# Patient Record
Sex: Male | Born: 1960 | Race: White | Hispanic: No | Marital: Married | Smoking: Never smoker
Health system: Southern US, Community
[De-identification: ages and names within clinical notes are randomized; demographics above are authoritative.]

## PROBLEM LIST (undated history)

## (undated) DIAGNOSIS — I2510 Atherosclerotic heart disease of native coronary artery without angina pectoris: Secondary | ICD-10-CM

## (undated) DIAGNOSIS — M722 Plantar fascial fibromatosis: Secondary | ICD-10-CM

## (undated) DIAGNOSIS — R739 Hyperglycemia, unspecified: Secondary | ICD-10-CM

## (undated) DIAGNOSIS — L409 Psoriasis, unspecified: Secondary | ICD-10-CM

## (undated) DIAGNOSIS — I255 Ischemic cardiomyopathy: Secondary | ICD-10-CM

## (undated) DIAGNOSIS — E669 Obesity, unspecified: Secondary | ICD-10-CM

## (undated) DIAGNOSIS — N400 Enlarged prostate without lower urinary tract symptoms: Secondary | ICD-10-CM

## (undated) DIAGNOSIS — E039 Hypothyroidism, unspecified: Secondary | ICD-10-CM

## (undated) DIAGNOSIS — N281 Cyst of kidney, acquired: Secondary | ICD-10-CM

## (undated) DIAGNOSIS — I4510 Unspecified right bundle-branch block: Secondary | ICD-10-CM

## (undated) DIAGNOSIS — E785 Hyperlipidemia, unspecified: Secondary | ICD-10-CM

## (undated) HISTORY — DX: Benign prostatic hyperplasia without lower urinary tract symptoms: N40.0

## (undated) HISTORY — DX: Hypothyroidism, unspecified: E03.9

## (undated) HISTORY — DX: Cyst of kidney, acquired: N28.1

## (undated) HISTORY — PX: COLONOSCOPY: SHX174

## (undated) HISTORY — DX: Psoriasis, unspecified: L40.9

## (undated) HISTORY — DX: Obesity, unspecified: E66.9

## (undated) HISTORY — DX: Plantar fascial fibromatosis: M72.2

---

## 2009-07-28 ENCOUNTER — Encounter: Payer: Self-pay | Admitting: Cardiovascular Disease

## 2009-07-28 ENCOUNTER — Ambulatory Visit: Payer: Self-pay

## 2009-07-28 DIAGNOSIS — M79609 Pain in unspecified limb: Secondary | ICD-10-CM | POA: Insufficient documentation

## 2010-07-27 NOTE — Miscellaneous (Signed)
Summary: Orders Update  Clinical Lists Changes  Problems: Added new problem of LEG PAIN (ICD-729.5) Orders: Added new Test order of Venous Duplex Lower Extremity (Venous Duplex Lower) - Signed 

## 2014-01-08 ENCOUNTER — Telehealth: Payer: Self-pay

## 2014-01-08 NOTE — Telephone Encounter (Signed)
Pt walked in with c/o lower left abdominal pain x 2 days. Pt notes that his diet has been off due to visiting family and eating a lot more fast foods. He notices that his BMs have been irregular, stating that they have been hard and he feels it may be constipation or a hernia. He notes that when he lies down the pain is better but still present. Moving makes it worse and pain is constant. Pt denies other sxs.  [facility]. Pt should try magnesium citrate tonight to tx possible constipation. If pt does not have a BM tonight and/or pain is still present in the morning, pt should call office to schedule appointment to see Dr. Durene CalHunter. Also advised pt if pain becomes extreme or he develops other sxs such as fever or chills, he should report to the nearest ER.  Pt verbalized understanding

## 2014-01-09 ENCOUNTER — Emergency Department (HOSPITAL_COMMUNITY)
Admission: EM | Admit: 2014-01-09 | Discharge: 2014-01-09 | Disposition: A | Discharge status: Home / Self Care | Payer: 59 | Attending: Emergency Medicine | Admitting: Emergency Medicine

## 2014-01-09 ENCOUNTER — Encounter (HOSPITAL_COMMUNITY): Payer: Self-pay | Admitting: Emergency Medicine

## 2014-01-09 ENCOUNTER — Emergency Department (HOSPITAL_COMMUNITY): Payer: 59

## 2014-01-09 DIAGNOSIS — R1032 Left lower quadrant pain: Secondary | ICD-10-CM | POA: Insufficient documentation

## 2014-01-09 DIAGNOSIS — Z79899 Other long term (current) drug therapy: Secondary | ICD-10-CM | POA: Insufficient documentation

## 2014-01-09 LAB — BASIC METABOLIC PANEL
Anion gap: 14 (ref 5–15)
BUN: 20 mg/dL (ref 6–23)
CO2: 24 mEq/L (ref 19–32)
Calcium: 9.3 mg/dL (ref 8.4–10.5)
Chloride: 104 mEq/L (ref 96–112)
Creatinine, Ser: 0.91 mg/dL (ref 0.50–1.35)
GFR calc Af Amer: >90 mL/min (ref >90)
GFR calc non Af Amer: >90 mL/min (ref >90)
Glucose, Bld: 105 mg/dL — ABNORMAL HIGH (ref 70–99)
Potassium: 4.2 mEq/L (ref 3.7–5.3)
Sodium: 142 mEq/L (ref 137–147)

## 2014-01-09 LAB — CBC WITH DIFFERENTIAL/PLATELET
Basophils Absolute: 0 10*3/uL (ref 0.0–0.1)
Basophils Relative: 0 % (ref 0–1)
Eosinophils Absolute: 0.1 10*3/uL (ref 0.0–0.7)
Eosinophils Relative: 1 % (ref 0–5)
HCT: 43.5 % (ref 39.0–52.0)
Hemoglobin: 14.3 g/dL (ref 13.0–17.0)
Lymphocytes Relative: 27 % (ref 12–46)
Lymphs Abs: 2.6 10*3/uL (ref 0.7–4.0)
MCH: 27.9 pg (ref 26.0–34.0)
MCHC: 32.9 g/dL (ref 30.0–36.0)
MCV: 85 fL (ref 78.0–100.0)
Monocytes Absolute: 1 10*3/uL (ref 0.1–1.0)
Monocytes Relative: 10 % (ref 3–12)
Neutro Abs: 5.8 10*3/uL (ref 1.7–7.7)
Neutrophils Relative %: 62 % (ref 43–77)
Platelets: 330 10*3/uL (ref 150–400)
RBC: 5.12 MIL/uL (ref 4.22–5.81)
RDW: 13.4 % (ref 11.5–15.5)
WBC: 9.5 10*3/uL (ref 4.0–10.5)

## 2014-01-09 LAB — I-STAT CG4 LACTIC ACID, ED: Lactic Acid, Venous: 1.23 mmol/L (ref 0.5–2.2)

## 2014-01-09 MED ORDER — FENTANYL CITRATE 0.05 MG/ML IJ SOLN
100.0000 ug | Freq: Once | INTRAMUSCULAR | Status: AC
  Administered 2014-01-09: 100 ug via INTRAVENOUS
  Filled 2014-01-09: qty 2

## 2014-01-09 MED ORDER — IOHEXOL 300 MG/ML  SOLN
100.0000 mL | Freq: Once | INTRAMUSCULAR | Status: AC | PRN
  Administered 2014-01-09: 100 mL via INTRAVENOUS

## 2014-01-09 MED ORDER — DOCUSATE SODIUM 100 MG PO CAPS: 100.0000 mg | ORAL_CAPSULE | Freq: Two times a day (BID) | ORAL | Status: DC

## 2014-01-09 MED ORDER — IOHEXOL 300 MG/ML  SOLN
50.0000 mL | Freq: Once | INTRAMUSCULAR | Status: AC | PRN
  Administered 2014-01-09: 50 mL via ORAL

## 2014-01-09 MED ORDER — TRAMADOL HCL 50 MG PO TABS: 50.0000 mg | ORAL_TABLET | Freq: Four times a day (QID) | ORAL | Status: DC | PRN

## 2014-01-09 NOTE — ED Provider Notes (Signed)
CSN: [id number]     Arrival date & time 01/09/14  0505 History   First MD Initiated Contact with Patient 01/09/14 [phone]     Chief Complaint  Patient presents with  . Abdominal Pain     (Consider location/radiation/quality/duration/timing/severity/associated sxs/prior Treatment) HPI Comments: 53 year old male who presents with a complaint of left lower quadrant and abdominal pain. He states this started 2 days ago, he feels as though this is related to constipation and no change in his diet, he has recently been at a family reunion where he has been "doubling up"on the meals.  He has had persistent gradually worsening pain in the LLQ, no BM in 3 days - went to the family doctor yesterday that told him to take mag citrate and he had a small am't of diarrhea but no appreciable stool.  He has no n/v and no f/c.  No prior abd surgery and no hx of diverticulitis - recently moved her - no other PMH.  Denies dysuria, hematuria, testicular pain.  Patient is a 53 y.o. male presenting with abdominal pain. The history is provided by the patient.  Abdominal Pain   History reviewed. No pertinent past medical history. History reviewed. No pertinent past surgical history. History reviewed. No pertinent family history. History  Substance Use Topics  . Smoking status: Never Smoker   . Smokeless tobacco: Not on file  . Alcohol Use: No    Review of Systems  Gastrointestinal: Positive for abdominal pain.  All other systems reviewed and are negative.     Allergies  Review of patient's allergies indicates no known allergies.  Home Medications   Prior to Admission medications   Medication Sig Start Date End Date Taking? Authorizing Provider  ibuprofen (ADVIL,MOTRIN) 200 MG tablet Take 800 mg by mouth every 6 (six) hours as needed (pain).   Yes Historical Provider, MD  magnesium citrate SOLN Take 1 Bottle by mouth once.   Yes Historical Provider, MD  docusate sodium (COLACE) 100 MG capsule Take 1  capsule (100 mg total) by mouth every 12 (twelve) hours. 01/09/14   Vida Roller, MD  traMADol (ULTRAM) 50 MG tablet Take 1 tablet (50 mg total) by mouth every 6 (six) hours as needed. 01/09/14   Vida Roller, MD   BP 135/81  Pulse 92  Temp(Src) 98.1 F (36.7 C) (Oral)  Resp 20  Ht 5\' 11"  (1.803 m)  Wt 225 lb (102.059 kg)  BMI 31.39 kg/m2  SpO2 98% Physical Exam  Nursing note and vitals reviewed. Constitutional: He appears well-developed and well-nourished. No distress.  HENT:  Head: Normocephalic and atraumatic.  Mouth/Throat: Oropharynx is clear and moist. No oropharyngeal exudate.  Eyes: Conjunctivae and EOM are normal. Pupils are equal, round, and reactive to light. Right eye exhibits no discharge. Left eye exhibits no discharge. No scleral icterus.  Neck: Normal range of motion. Neck supple. No JVD present. No thyromegaly present.  Cardiovascular: Normal rate, regular rhythm, normal heart sounds and intact distal pulses.  Exam reveals no gallop and no friction rub.   No murmur heard. Pulmonary/Chest: Effort normal and breath sounds normal. No respiratory distress. He has no wheezes. He has no rales.  Abdominal: Soft. Bowel sounds are normal. He exhibits no distension and no mass. There is tenderness ( ttp in the LLQ is mild - no rebound or peritoneal signs).  Musculoskeletal: Normal range of motion. He exhibits no edema and no tenderness.  Lymphadenopathy:    He has no cervical adenopathy.  Neurological:  He is alert. Coordination normal.  Skin: Skin is warm and dry. No rash noted. No erythema.  Psychiatric: He has a normal mood and affect. His behavior is normal.    ED Course  Procedures (including critical care time) Labs Review Labs Reviewed  BASIC METABOLIC PANEL - Abnormal; Notable for the following:    Glucose, Bld 105 (*)    All other components within normal limits  CBC WITH DIFFERENTIAL  I-STAT CG4 LACTIC ACID, ED    Imaging Review Ct Abdomen Pelvis W  Contrast  01/09/2014   CLINICAL DATA:  Left lower quadrant pain.  EXAM: CT ABDOMEN AND PELVIS WITH CONTRAST  TECHNIQUE: Multidetector CT imaging of the abdomen and pelvis was performed using the standard protocol following bolus administration of intravenous contrast.  CONTRAST:  100mL OMNIPAQUE IOHEXOL 300 MG/ML  SOLN  COMPARISON:  None.  FINDINGS: The liver, biliary tree, spleen, pancreas and adrenal glands are normal. There is a 2 mm stone in the upper pole of the right kidney and a 17 mm cyst on the posterior aspect of mid left kidney.  The bowel appears normal including the terminal ileum and appendix. No diverticular disease. No osseous abnormality. No free air or free fluid.  IMPRESSION: No acute abnormality. Tiny stone in the upper pole of the right kidney. Small cyst on the left kidney.   Electronically Signed   By: Geanie CooleyJim  Maxwell M.D.   On: 01/09/2014 07:17      MDM   Final diagnoses:  Left lower quadrant pain    Possible diverticulitis - constipation, hernia, less likely AAA or appy.  Labs unremarkable, CT scan negative for acute findings  Patient informed of results, medications as below   Meds given in ED:  Medications  fentaNYL (SUBLIMAZE) injection 100 mcg (100 mcg Intravenous Given 01/09/14 0550)  iohexol (OMNIPAQUE) 300 MG/ML solution 50 mL (50 mLs Oral Contrast Given 01/09/14 0557)  iohexol (OMNIPAQUE) 300 MG/ML solution 100 mL (100 mLs Intravenous Contrast Given 01/09/14 0648)    New Prescriptions   DOCUSATE SODIUM (COLACE) 100 MG CAPSULE    Take 1 capsule (100 mg total) by mouth every 12 (twelve) hours.   TRAMADOL (ULTRAM) 50 MG TABLET    Take 1 tablet (50 mg total) by mouth every 6 (six) hours as needed.      Vida RollerBrian D Morgen Linebaugh, MD 01/09/14 [id number]

## 2014-01-09 NOTE — ED Notes (Signed)
Pt is c/o abd pain in his left lower quadrant  Pt states the pain started on Tuesday  Pt was seen by his GI dr yesterday and was told to take a laxative so he took one last night  Pt states he has been very uncomfortable all night and has not been able to sleep

## 2014-01-22 ENCOUNTER — Encounter: Payer: Self-pay | Admitting: Family Medicine

## 2014-01-22 ENCOUNTER — Ambulatory Visit: Payer: 59

## 2014-01-22 ENCOUNTER — Ambulatory Visit (INDEPENDENT_AMBULATORY_CARE_PROVIDER_SITE_OTHER): Payer: 59 | Admitting: Family Medicine

## 2014-01-22 VITALS — BP 140/98 | HR 56 | Temp 98.4°F | Wt 232.0 lb

## 2014-01-22 DIAGNOSIS — N281 Cyst of kidney, acquired: Secondary | ICD-10-CM

## 2014-01-22 DIAGNOSIS — G43909 Migraine, unspecified, not intractable, without status migrainosus: Secondary | ICD-10-CM | POA: Insufficient documentation

## 2014-01-22 DIAGNOSIS — L409 Psoriasis, unspecified: Secondary | ICD-10-CM

## 2014-01-22 DIAGNOSIS — E039 Hypothyroidism, unspecified: Secondary | ICD-10-CM

## 2014-01-22 DIAGNOSIS — N400 Enlarged prostate without lower urinary tract symptoms: Secondary | ICD-10-CM | POA: Insufficient documentation

## 2014-01-22 DIAGNOSIS — IMO0001 Reserved for inherently not codable concepts without codable children: Secondary | ICD-10-CM

## 2014-01-22 DIAGNOSIS — G43809 Other migraine, not intractable, without status migrainosus: Secondary | ICD-10-CM

## 2014-01-22 DIAGNOSIS — R030 Elevated blood-pressure reading, without diagnosis of hypertension: Secondary | ICD-10-CM

## 2014-01-22 DIAGNOSIS — Q619 Cystic kidney disease, unspecified: Secondary | ICD-10-CM

## 2014-01-22 DIAGNOSIS — M722 Plantar fascial fibromatosis: Secondary | ICD-10-CM

## 2014-01-22 DIAGNOSIS — E669 Obesity, unspecified: Secondary | ICD-10-CM

## 2014-01-22 DIAGNOSIS — G43C0 Periodic headache syndromes in child or adult, not intractable: Secondary | ICD-10-CM

## 2014-01-22 DIAGNOSIS — L408 Other psoriasis: Secondary | ICD-10-CM

## 2014-01-22 HISTORY — DX: Cyst of kidney, acquired: N28.1

## 2014-01-22 LAB — HEPATIC FUNCTION PANEL
ALT: 47 U/L (ref 0–53)
AST: 31 U/L (ref 0–37)
Albumin: 4.2 g/dL (ref 3.5–5.2)
Alkaline Phosphatase: 43 U/L (ref 39–117)
Bilirubin, Direct: 0.1 mg/dL (ref 0.0–0.3)
Total Bilirubin: 0.9 mg/dL (ref 0.2–1.2)
Total Protein: 7.1 g/dL (ref 6.0–8.3)

## 2014-01-22 LAB — LIPID PANEL
Cholesterol: 228 mg/dL — ABNORMAL HIGH (ref 0–200)
HDL: 49.8 mg/dL (ref >39.00)
LDL Cholesterol: 152 mg/dL — ABNORMAL HIGH (ref 0–99)
NonHDL: 178.2
Total CHOL/HDL Ratio: 5
Triglycerides: 133 mg/dL (ref 0.0–149.0)
VLDL: 26.6 mg/dL (ref 0.0–40.0)

## 2014-01-22 LAB — TSH: TSH: 7.56 u[IU]/mL — ABNORMAL HIGH (ref 0.35–4.50)

## 2014-01-22 LAB — HEMOGLOBIN A1C: Hgb A1c MFr Bld: 6.1 % (ref 4.6–6.5)

## 2014-01-22 LAB — PSA: PSA: 0.64 ng/mL (ref 0.10–4.00)

## 2014-01-22 MED ORDER — TRIAMCINOLONE ACETONIDE 0.1 % EX CREA: 1.0000 "application " | TOPICAL_CREAM | Freq: Two times a day (BID) | CUTANEOUS | Status: DC

## 2014-01-22 NOTE — Progress Notes (Signed)
Tana Conch, MD Phone: [phone]  Subjective:  Patient presents today to establish care. Chief complaint-noted.   Abdominal Pain Felt like pulled muscle in LLQ. Low and deep pain. Thought also could be indigestion as was eating very large meals. Slowly worsened over several days. Felt enough pain and walked into office and talked to nursing team. Recommended OTC mag citrate. Very small bowel movement with this. Pain intensified that evening and went to ED. Had CT scan which was largely unremarkable except for kidney stone in upper pole which is not likely cause. 7/10 pain down to 4/10 with fentanyl. Usually BM every day or every other day. At least a few days between bowel movements at time of elevation of pain. Has taken colace and pain has gone down to 0/10. Drank a lot of water as concerned about kidney stone as cause of pain. Next morning had very low level of pain. Did not have large bowel movement that evening ROS-no fever/chills. No vomiting. Mild nausea.   Elevated Blood Pressure  BP Readings from Last 3 Encounters:  01/22/14 140/98  01/09/14 136/86  Home BP monitoring-no Compliant with medications-no medications ROS-Denies any CP, HA, SOB, blurry vision, LE edema.  Hypothyroidism On thyroid medication-no, reports many years ago was on low dose thyroid medication for fatigue.  ROS-No hair or nail changes. No heat/cold intolerance. No constipation or diarrhea. Denies shakiness or anxiety.   Psoriasis Long term problem. 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 Has 2 small spots on his back he is not sure is psoriasis ROS- noted recently, unsure if changing size. Red and scaly. Similar to lesion on elbow.   The following were reviewed and entered/updated in epic: Past Medical History  Diagnosis Date  . Psoriasis   . Plantar fasciitis of left foot   . Hypothyroidism     patient questionable about diagnosis  . BPH (benign prostatic hyperplasia)     describes treatment  [place] country, not currently as bothresome  . Renal cyst 01/22/2014    17mm left kidney. Bozniak Class I in discussion with [facility]. No further follow up.     Patient Active Problem List   Diagnosis Date Noted  . Renal cyst 01/22/2014    Priority: Low  . Elevated blood pressure 01/22/2014    Priority: Low  . Obesity 01/22/2014    Priority: Low  . Migraine 01/22/2014    Priority: Low  . Psoriasis     Priority: Low  . Plantar fasciitis of left foot     Priority: Low  . Hypothyroidism     Priority: Low  . BPH (benign prostatic hyperplasia)     Priority: Low   Past Surgical History  Procedure Laterality Date  . Colonoscopy      30s. concern for diverticulitis.     Family History  Problem Relation Age of Onset  . Hypothyroidism Mother   . Lung cancer      father and grand father but both smokers. Both deceased due to this.   . Diabetes Father   . Hypothyroidism Sister   . Bipolar disorder Brother     Medications- reviewed and updated Current Outpatient Prescriptions  Medication Sig Dispense Refill  . aspirin 81 MG tablet Take 81 mg by mouth daily.      Marland Kitchen ibuprofen (ADVIL,MOTRIN) 200 MG tablet Take 800 mg by mouth every 6 (six) hours as needed (pain).      . triamcinolone cream (KENALOG) 0.1 % Apply 1  application topically 2 (two) times daily. For 2 weeks then 1 week break to spots on back  85.2 g  0   No current facility-administered medications for this visit.   Allergies-reviewed and updated No Known Allergies  History   Social History  . Marital Status: Married    Spouse Name: N/A    Number of Children: N/A  . Years of Education: N/A   Social History Main Topics  . Smoking status: Never Smoker   . Smokeless tobacco: None  . Alcohol Use: 1.5 oz/week    3 drink(s) per week  . Drug Use: No  . Sexual Activity: None   Other Topics Concern  . None   Social History Narrative   Married for 26 years in 2015. 2 kids- 20  daughter-[employer] and 17 son.    Hobbies- yardwork, family time, travel-[place]; sports-pro football and college basketball, music.    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 26 years-lots of travel previously lived in [place], [place] (12 years). Just relocated to [facility] 12/2013.    ROS--See HPI, otherwise full 12 point review of systems completed and negative  Objective: BP 140/98  Pulse 56  Temp(Src) 98.4 F (36.9 C)  Wt 232 lb (105.235 kg) Gen: NAD, resting comfortably HEENT: Mucous membranes are moist. Oropharynx normal Neck: no thyromegaly CV: RRR no murmurs rubs or gallops Lungs: CTAB no crackles, wheeze, rhonchi Abdomen: soft/nontender/nondistended/normal bowel sounds. No rebound or guarding.  Ext: no edema, 2+ peripheral pulses Skin: warm, dry, scaly rash on left elbow. 2 small <2 cm scaly plaques  Neuro: grossly normal, moves all extremities, PERRLA  Assessment/Plan:  Hypothyroidism Unclear if truly has history of hypothyroidism and asymptomatic at this time. Give on thyroid medication before, check TSH before physical.   Psoriasis Currently untreated. 2 small lesions on back <2 cm. Will attempt triamcinolone to give further information.   Elevated blood pressure First elevated reading today. We discussed a dash diet and follow up at physical.   Obesity Advised of need for weight loss with goal BMI <30. Referred to nutrition. Check a1c given brother with DM and obesity.   Renal cyst 17mm left kidney. Bozniak Class I in [facility] with [facility]. No further follow up. Will resolve problem.    Abdominal Pain Unclear etiology. May have been constipation or kidney stone. Now resolved. Benign abdominal exam today.   Counseled patient on grade D recommendation for prostate cancer as well as benefits/risks of screening. He would like to still be screened.   Orders Placed This Encounter  Procedures  .  Hepatic Function Panel    Standing Status: Future     Number of Occurrences: 1     Standing Expiration Date: 01/23/2015  . Hemoglobin A1c    [place]    Standing Status: Future     Number of Occurrences: 1     Standing Expiration Date: 01/23/2015  . TSH    [place]    Standing Status: Future     Number of Occurrences: 1     Standing Expiration Date: 01/23/2015  . PSA    Standing Status: Future     Number of Occurrences: 1     Standing Expiration Date: 01/23/2015  . Lipid panel    [place]    Standing Status: Future     Number of Occurrences: 1     Standing Expiration Date: 01/22/2015    Order Specific Question:  Has the patient fasted?  Answer:  Yes  . Ambulatory referral to Nutrition and Diabetic Education    Referral Priority:  Routine    Referral Type:  Consultation    Referral Reason:  Specialty Services Required    Number of Visits Requested:  1    Meds ordered this encounter  Medications  . triamcinolone cream (KENALOG) 0.1 %    Sig: Apply 1 application topically 2 (two) times daily. For 2 weeks then 1 week break to spots on back    Dispense:  85.2 g    Refill:  0  . aspirin 81 MG tablet    Sig: Take 81 mg by mouth daily.

## 2014-01-22 NOTE — Assessment & Plan Note (Signed)
Advised of need for weight loss with goal BMI <30. Referred to nutrition. Check a1c given brother with DM and obesity.

## 2014-01-22 NOTE — Assessment & Plan Note (Signed)
17mm left kidney. [facility] No further follow up. Will resolve problem.

## 2014-01-22 NOTE — Patient Instructions (Addendum)
Abdominal Pain-unclear etiology. May have been constipation or kidney stone.   I am thrilled to hear you want to make some changes to live a healthier lifestyle. I have referred you to nutrition as well as made some suggestions below.   I want you to try the steroid cream on your back to see if this helps (would make psoriasis even more likely).   Over age 53, I would recommend a daily 81mg  aspirin as you are a male.   We will get the following labs. Set up a physical and then a lab appointment about a week before. We will discuss your labs at that visit.   Orders Placed This Encounter  Procedures  . Hepatic Function Panel    Standing Status: Future     Number of Occurrences:      Standing Expiration Date: 01/23/2015  . Hemoglobin A1c    [place]    Standing Status: Future     Number of Occurrences:      Standing Expiration Date: 01/23/2015  . TSH    [place]    Standing Status: Future     Number of Occurrences:      Standing Expiration Date: 01/23/2015  . Ambulatory referral to Nutrition and Diabetic Education    Referral Priority:  Routine    Referral Type:  Consultation    Referral Reason:  Specialty Services Required    Number of Visits Requested:  1   Health Maintenance Due  Topic Date Due  . Tetanus/tdap  12/13/1981  . Colonoscopy  12/13/2012            My 5 to Fitness!  5: fruits and vegetables per day (work on 9 per day if you are at 5) 4: exercise 4-5 times per week for at least 30 minutes (walking counts!). 150 minutes per week 3: meals per day (don't skip breakfast!) 2: habits to quit  -smoking  -excess alcohol use (men >2 beer/day; women >1beer/day) 1: sweet per day (2 cookies, 1 small cup of ice cream, 12 oz soda)  These are general tips for healthy living. Try to start with 1 or 2 habit TODAY and make it a part of your life for several months.  Once you have 1 or 2 habits down for several months, try to begin working on your next healthy habit. With  every single step you take, you will be leading a healthier lifestyle!  DASH Eating Plan DASH stands for "Dietary Approaches to Stop Hypertension." The DASH eating plan is a healthy eating plan that has been shown to reduce high blood pressure (hypertension). Additional health benefits may include reducing the risk of type 2 diabetes mellitus, heart disease, and stroke. The DASH eating plan may also help with weight loss. WHAT DO I NEED TO KNOW ABOUT THE DASH EATING PLAN? For the DASH eating plan, you will follow these general guidelines:  Choose foods with a percent daily value for sodium of less than 5% (as listed on the food label).  Use salt-free seasonings or herbs instead of table salt or sea salt.  Check with your health care provider or pharmacist before using salt substitutes.  Eat lower-sodium products, often labeled as "lower sodium" or "no salt added."  Eat fresh foods.  Eat more vegetables, fruits, and low-fat dairy products.  Choose whole grains. Look for the word "whole" as the first word in the ingredient list.  Choose fish and skinless chicken or [place] more often than red meat. Limit fish, poultry, and  meat to 6 oz (170 g) each day.  Limit sweets, desserts, sugars, and sugary drinks.  Choose heart-healthy fats.  Limit cheese to 1 oz (28 g) per day.  Eat more home-cooked food and less restaurant, buffet, and fast food.  Limit fried foods.  Cook foods using methods other than frying.  Limit canned vegetables. If you do use them, rinse them well to decrease the sodium.  When eating at a restaurant, ask that your food be prepared with less salt, or no salt if possible. WHAT FOODS CAN I EAT? Seek help from a dietitian for individual calorie needs. Grains Whole grain or whole wheat bread. Brown rice. Whole grain or whole wheat pasta. Quinoa, bulgur, and whole grain cereals. Low-sodium cereals. Corn or whole wheat flour tortillas. Whole grain cornbread. Whole grain  crackers. Low-sodium crackers. Vegetables Fresh or frozen vegetables (raw, steamed, roasted, or grilled). Low-sodium or reduced-sodium tomato and vegetable juices. Low-sodium or reduced-sodium tomato sauce and paste. Low-sodium or reduced-sodium canned vegetables.  Fruits All fresh, canned (in natural juice), or frozen fruits. Meat and Other Protein Products Ground beef (85% or leaner), grass-fed beef, or beef trimmed of fat. Skinless chicken or [place]. Ground chicken or [place]. Pork trimmed of fat. All fish and seafood. Eggs. Dried beans, peas, or lentils. Unsalted nuts and seeds. Unsalted canned beans. Dairy Low-fat dairy products, such as skim or 1% milk, 2% or reduced-fat cheeses, low-fat ricotta or cottage cheese, or plain low-fat yogurt. Low-sodium or reduced-sodium cheeses. Fats and Oils Tub margarines without trans fats. Light or reduced-fat mayonnaise and salad dressings (reduced sodium). Avocado. Safflower, olive, or canola oils. Natural peanut or almond butter. Other Unsalted popcorn and pretzels. The items listed above may not be a complete list of recommended foods or beverages. Contact your dietitian for more options. WHAT FOODS ARE NOT RECOMMENDED? Grains White bread. White pasta. White rice. Refined cornbread. Bagels and croissants. Crackers that contain trans fat. Vegetables Creamed or fried vegetables. Vegetables in a cheese sauce. Regular canned vegetables. Regular canned tomato sauce and paste. Regular tomato and vegetable juices. Fruits Dried fruits. Canned fruit in light or heavy syrup. Fruit juice. Meat and Other Protein Products Fatty cuts of meat. Ribs, chicken wings, bacon, sausage, bologna, salami, chitterlings, fatback, hot dogs, bratwurst, and packaged luncheon meats. Salted nuts and seeds. Canned beans with salt. Dairy Whole or 2% milk, cream, half-and-half, and cream cheese. Whole-fat or sweetened yogurt. Full-fat cheeses or blue cheese. Nondairy creamers and  whipped toppings. Processed cheese, cheese spreads, or cheese curds. Condiments Onion and garlic salt, seasoned salt, table salt, and sea salt. Canned and packaged gravies. Worcestershire sauce. Tartar sauce. Barbecue sauce. Teriyaki sauce. Soy sauce, including reduced sodium. Steak sauce. Fish sauce. Oyster sauce. Cocktail sauce. Horseradish. Ketchup and mustard. Meat flavorings and tenderizers. Bouillon cubes. Hot sauce. Tabasco sauce. Marinades. Taco seasonings. Relishes. Fats and Oils Butter, stick margarine, lard, shortening, ghee, and bacon fat. Coconut, palm kernel, or palm oils. Regular salad dressings. Other Pickles and olives. Salted popcorn and pretzels. The items listed above may not be a complete list of foods and beverages to avoid. Contact your dietitian for more information. WHERE CAN I FIND MORE INFORMATION? National Heart, Lung, and Blood Institute: CableProm[employer].itwww.nhlbi.nih.gov/health/health-topics/topics/dash/ Document Released: 06/02/2011 Document Revised: 10/28/2013 Document Reviewed: 04/17/2013 [facility] Patient Information 2015 [employer] [place]. This information is not intended to replace advice given to you by your health care provider. Make sure you discuss any questions you have with your health care provider.

## 2014-01-22 NOTE — Assessment & Plan Note (Signed)
First elevated reading today. We discussed a dash diet and follow up at physical.

## 2014-01-22 NOTE — Assessment & Plan Note (Signed)
Unclear if truly has history of hypothyroidism and asymptomatic at this time. Give on thyroid medication before, check TSH before physical.

## 2014-01-22 NOTE — Assessment & Plan Note (Signed)
Currently untreated. 2 small lesions on back <2 cm. Will attempt triamcinolone to give further information.

## 2014-01-23 LAB — T4, FREE: Free T4: 0.69 ng/dL (ref 0.60–1.60)

## 2014-03-14 ENCOUNTER — Ambulatory Visit: Payer: 59 | Admitting: Dietician

## 2014-03-14 ENCOUNTER — Encounter: Payer: 59 | Admitting: Family Medicine

## 2014-03-31 ENCOUNTER — Other Ambulatory Visit (HOSPITAL_COMMUNITY)
Admission: RE | Admit: 2014-03-31 | Discharge: 2014-03-31 | Disposition: A | Discharge status: Home / Self Care | Payer: 59 | Source: Ambulatory Visit | Attending: Family Medicine | Admitting: Family Medicine

## 2014-03-31 ENCOUNTER — Ambulatory Visit (INDEPENDENT_AMBULATORY_CARE_PROVIDER_SITE_OTHER): Payer: 59 | Admitting: Family Medicine

## 2014-03-31 ENCOUNTER — Encounter: Payer: Self-pay | Admitting: Family Medicine

## 2014-03-31 VITALS — BP 120/72 | HR 80 | Temp 98.6°F | Wt 229.0 lb

## 2014-03-31 DIAGNOSIS — Z789 Other specified health status: Secondary | ICD-10-CM

## 2014-03-31 DIAGNOSIS — Z0000 Encounter for general adult medical examination without abnormal findings: Secondary | ICD-10-CM

## 2014-03-31 DIAGNOSIS — Z113 Encounter for screening for infections with a predominantly sexual mode of transmission: Secondary | ICD-10-CM | POA: Insufficient documentation

## 2014-03-31 DIAGNOSIS — Z202 Contact with and (suspected) exposure to infections with a predominantly sexual mode of transmission: Secondary | ICD-10-CM

## 2014-03-31 DIAGNOSIS — E039 Hypothyroidism, unspecified: Secondary | ICD-10-CM

## 2014-03-31 DIAGNOSIS — IMO0001 Reserved for inherently not codable concepts without codable children: Secondary | ICD-10-CM

## 2014-03-31 DIAGNOSIS — Z1211 Encounter for screening for malignant neoplasm of colon: Secondary | ICD-10-CM

## 2014-03-31 NOTE — Patient Instructions (Addendum)
Refer to GI for colonoscopy  Check thyroid today         STD screening as well as foreign travel screening-Hep b, hep c, others. Can send mychart message if you sign up.

## 2014-03-31 NOTE — Progress Notes (Signed)
Luke Conch, MD Phone: [phone]  Subjective:  Patient presents today for annual physical. Chief complaint-noted.   Started to work on diet since last visit. Has not bee able to exercise yet. Appointment with nutrition next week.  ROS-fatigue with mild increase (as noted before did not improve on thyroid medication) and started vitamin D ?  The following were reviewed and entered/updated in epic: Past Medical History  Diagnosis Date  . Psoriasis   . Plantar fasciitis of left foot   . Hypothyroidism     patient questionable about diagnosis  . BPH (benign prostatic hyperplasia)     des[place], not currently as bothresome  . Renal cyst 01/22/2014    17mm left kidney. Bozniak Class I in discussion with [facility]. No further follow up.     Patient Active Problem List   Diagnosis Date Noted  . Hypothyroidism     Priority: Medium  . Elevated blood pressure 01/22/2014    Priority: Low  . Obesity 01/22/2014    Priority: Low  . Migraine 01/22/2014    Priority: Low  . Psoriasis     Priority: Low  . Plantar fasciitis of left foot     Priority: Low  . BPH (benign prostatic hyperplasia)     Priority: Low   Past Surgical History  Procedure Laterality Date  . Colonoscopy      30s. concern for diverticulitis.     Family History  Problem Relation Age of Onset  . Hypothyroidism Mother   . Lung cancer      father and grand father but both smokers. Both deceased due to this.   . Diabetes Father   . Hypothyroidism Sister   . Bipolar disorder Brother     Medications- reviewed and updated Current Outpatient Prescriptions  Medication Sig Dispense Refill  . aspirin 81 MG tablet Take 81 mg by mouth daily.      Marland Kitchen triamcinolone cream (KENALOG) 0.1 % Apply 1 application topically 2 (two) times daily. For 2 weeks then 1 week break to spots on back  85.2 g  0  . ibuprofen (ADVIL,MOTRIN) 200 MG tablet Take 800 mg by mouth every  6 (six) hours as needed (pain).       No current facility-administered medications for this visit.    Allergies-reviewed and updated No Known Allergies  History   Social History  . Marital Status: Married    Spouse Name: N/A    Number of Children: N/A  . Years of Education: N/A   Social History Main Topics  . Smoking status: Never Smoker   . Smokeless tobacco: None  . Alcohol Use: 1.5 oz/week    3 drink(s) per week  . Drug Use: No  . Sexual Activity: None   Other Topics Concern  . None   Social History Narrative   Married for 26 years in 2015. 2 kids- 20 daughter-[employer] and 17 son.    Hobbies- yardwork, family time, travel[place]; sports-pro football and college basketball, music.    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 26 years-lots of travel previously lived in [place], [place] (12 years). Just relocated to [facility] 12/2013.     ROS--See HPI   Objective: BP 120/72  Pulse 80  Temp(Src) 98.6 F (37 C)  Wt 229 lb (103.874 kg) Gen: NAD, resting comfortably HEENT: Mucous membranes are moist. Oropharynx normal. Good dentition.  Neck: no thyromegaly CV: RRR no murmurs rubs or  gallops Lungs: CTAB no crackles, wheeze, rhonchi Abdomen: soft/nontender/nondistended/normal bowel sounds. No rebound or guarding.  Rectal: Prostate normal Ext: no edema Skin: warm, dry, several improving plaques from psoriasis on back, elbows, around umbilicus Neuro: grossly normal, moves all extremities, PERRLA  Assessment/Plan:  53 y.o. male presenting for annual physical.  Health Maintenance counseling: 1. Anticipatory guidance: Patient counseled regarding regular dental exams, wearing seatbelts.  2. Risk factor reduction:  Advised patient of need for regular exercise and diet rich and fruits and vegetables to reduce risk of heart attack and stroke.  3. Immunizations/screenings/ancillary studies Health Maintenance Due  Topic Date Due  .  Tetanus/tdap -today (planned, not clear if this was given-Keba to call-I may not have had her give him this immunization as planned) 12/13/1981  . Colonoscopy - referred today 12/13/2012  . Influenza Vaccine -prior 01/25/2014   [place]ly lived out of country and requests testing for potential exposures-STD screening also with Hep C, B. Known to be immunized to Hep A.  Orders Placed This Encounter  Procedures  . TSH    [facility]  . Hepatitis C antibody, reflex    [facility]  . HIV antibody    [facility]  . RPR    [facility]  . Hep B Surface Antigen  . Hep B Surface Antibody  . Hepatitis B core antibody, IgM  . Ambulatory referral to Gastroenterology    Referral Priority:  Routine    Referral Type:  Consultation    Referral Reason:  Specialty Services Required    Requested Specialty:  Gastroenterology    Number of Visits Requested:  1

## 2014-04-01 LAB — HEPATITIS B CORE ANTIBODY, IGM: Hep B C IgM: NONREACTIVE

## 2014-04-01 LAB — HIV ANTIBODY (ROUTINE TESTING W REFLEX): HIV 1&2 Ab, 4th Generation: NONREACTIVE

## 2014-04-01 LAB — TSH: TSH: 7.54 u[IU]/mL — ABNORMAL HIGH (ref 0.35–4.50)

## 2014-04-01 LAB — URINE CYTOLOGY ANCILLARY ONLY
Chlamydia: NEGATIVE
Neisseria Gonorrhea: NEGATIVE

## 2014-04-01 LAB — HEPATITIS B SURFACE ANTIBODY,QUALITATIVE: Hep B S Ab: NEGATIVE

## 2014-04-01 LAB — RPR

## 2014-04-01 LAB — HEPATITIS B SURFACE ANTIGEN: Hepatitis B Surface Ag: NEGATIVE

## 2014-04-01 LAB — HEPATITIS C ANTIBODY, REFLEX: HCV Ab: NEGATIVE

## 2014-04-01 NOTE — Progress Notes (Signed)
Called pt and LM to have him CB and be put on injection schedule for Tdap and TB

## 2014-04-02 ENCOUNTER — Other Ambulatory Visit: Payer: Self-pay | Admitting: Family Medicine

## 2014-04-02 MED ORDER — LEVOTHYROXINE SODIUM 25 MCG PO TABS: 25.0000 ug | ORAL_TABLET | Freq: Every day | ORAL | Status: DC

## 2014-04-04 ENCOUNTER — Encounter: Payer: Self-pay | Admitting: Internal Medicine

## 2014-04-09 ENCOUNTER — Ambulatory Visit (INDEPENDENT_AMBULATORY_CARE_PROVIDER_SITE_OTHER): Payer: 59 | Admitting: Family Medicine

## 2014-04-09 DIAGNOSIS — Z111 Encounter for screening for respiratory tuberculosis: Secondary | ICD-10-CM

## 2014-04-09 DIAGNOSIS — Z23 Encounter for immunization: Secondary | ICD-10-CM

## 2014-04-25 ENCOUNTER — Encounter: Payer: Self-pay | Admitting: Dietician

## 2014-04-25 ENCOUNTER — Encounter: Payer: 59 | Attending: Family Medicine | Admitting: Dietician

## 2014-04-25 VITALS — Ht 67.0 in | Wt 221.4 lb

## 2014-04-25 DIAGNOSIS — Z713 Dietary counseling and surveillance: Secondary | ICD-10-CM | POA: Insufficient documentation

## 2014-04-25 DIAGNOSIS — Z6834 Body mass index (BMI) 34.0-34.9, adult: Secondary | ICD-10-CM | POA: Diagnosis not present

## 2014-04-25 DIAGNOSIS — E669 Obesity, unspecified: Secondary | ICD-10-CM | POA: Insufficient documentation

## 2014-04-25 NOTE — Progress Notes (Signed)
Medical Nutrition Therapy:  Appt start time: 1015 end time:  1130.   Assessment:  Primary concerns today: Luke Rice was referred today for obesity. He self reports his cholesterol is "borderline high" and his most recent HgA1c was 6.1% on 01/22/14.  He self reports weight loss of 10 lbs in the last 6-8 weeks following the NaturallySlim diet (based off of hunger and fullness and eating slowly, savoring each bite with restriction of sweet foods/beverages).  He has tried a high protein diet before and liked that as well.  He has also done weight watchers in the past and lost 18 lbs with tracking his food.  He works for [employer] ([employer]), he wakes up at 5:30am, works 7-5pm 5 days a week.  Normally asleep by 9:30pm, getting 8 hrs of sleep per night.   He does yardwork on the weekends.  TV 1.5 hours a day, maybe 4-5 hours on the weekend. He lives with his wife and his son.  His wife is also seeing success on the [employer] diet.  He likes meats, fried foods and french fries, and "country-style" vegetables.  He also likes spicy foods. His goal is to lose 20 lbs and get down to 200 lbs and then maintain under 200 lbs.  He knows he needs to exercise but has not started yet, he is in the preparation stage of change.    Learning Readiness:  Change in progress  MEDICATIONS: see list. Supplements: Essential Oils, Probiotic, Anti yeast and fungus   DIETARY INTAKE:  Usual eating pattern includes 2 meals and 0-1 snacks per day.  24-hr recall:  B ( AM): skips unless hungry, drinks 2 cups of water and 3-4 cups black coffee Snk ( AM): none  L (11:30 AM): hotdog with chili and slaw and onions and small fry OR BBQ chicken and slaw with water or unsweet tea with splenda Snk ( PM): none D ( PM): pasta out to eat OR chicken at home, water or unsweet tea Snk ( PM): none Beverages: black coffee, sometimes grapefruit juice, water Snacks: pringles potato chips, lance cheese crackers, or  peanuts  Usual physical activity: walks dog a couple times a week in the evening  Estimated energy needs: 1800-2000 calories 135 grams carbohydrate  Progress Towards Goal(s):  In progress.   Nutritional Diagnosis:  [place]-3.3 Overweight/obesity As related to frequent consumption of energy dense foods and limited physical activity.  As evidenced by dietary recall and BMI >30.    Intervention:  Nutrition counseling for weight management. Motivational interviewing for behavior change regarding exercise and food choices.  Utilized MyPlate to demonstrate balanced meals.  Discussed his A1c score and stated that he is in prediabetic range.  Discussed benefit of exercise and weight loss for blood sugar management and overall health.  Briefly described which foods affect blood sugar.  Patient was able to state multiple benefits of exercise.  Together we set the following goals:  Action Plan: Look into purchasing treadmill or punching bag options. Walk 3 days a week with the dog, 1.5 miles for about 20 min in the evening. Physical activity guidelines are to work up to 250 min per week (35-40 min per day). Continue to focus on hunger and fullness cues. Practice moderation throughout the holidays.  Teaching Method Utilized: Visual Auditory  Handouts given during visit include:  MyPlate Portion Plate  Barriers to learning/adherence to lifestyle change: none  Demonstrated degree of understanding via:  Teach Back   Monitoring/Evaluation:  Dietary intake, exercise,  labs, and body weight in 3 month(s).

## 2014-04-25 NOTE — Patient Instructions (Signed)
Look into purchasing treadmill or punching bag options. Walk 3 days a week with the dog, 1.5 miles for about 20 min in the evening. Physical activity guidelines are to work up to 250 min per week (35-40 min per day). Continue to focus on hunger and fullness cues. Practice moderation throughout the holidays.

## 2014-05-01 ENCOUNTER — Encounter: Payer: 59 | Admitting: Family Medicine

## 2014-05-26 ENCOUNTER — Ambulatory Visit (AMBULATORY_SURGERY_CENTER): Payer: Self-pay | Admitting: *Deleted

## 2014-05-26 VITALS — Ht 67.0 in | Wt 226.8 lb

## 2014-05-26 DIAGNOSIS — Z1211 Encounter for screening for malignant neoplasm of colon: Secondary | ICD-10-CM

## 2014-05-26 NOTE — Progress Notes (Signed)
No egg or soy allergy. ewm Sedation 20 + yrs ago sedation with no issues and no sedation since. ewm No diet pills, no blood thinners ewm No home 02 use. ewm emmi video. ewm

## 2014-05-29 ENCOUNTER — Encounter: Payer: Self-pay | Admitting: Internal Medicine

## 2014-06-09 ENCOUNTER — Encounter: Payer: 59 | Admitting: Internal Medicine

## 2014-06-10 ENCOUNTER — Encounter: Payer: 59 | Admitting: Internal Medicine

## 2014-06-26 ENCOUNTER — Encounter: Payer: Self-pay | Admitting: Internal Medicine

## 2014-07-21 ENCOUNTER — Ambulatory Visit (AMBULATORY_SURGERY_CENTER): Payer: Self-pay | Admitting: *Deleted

## 2014-07-21 VITALS — Ht 67.0 in | Wt 229.2 lb

## 2014-07-21 DIAGNOSIS — Z1211 Encounter for screening for malignant neoplasm of colon: Secondary | ICD-10-CM

## 2014-07-21 NOTE — Progress Notes (Signed)
No egg or soy allergy, no diet pills, no home 02 use, no issues with past sedation. ewm Pt declined emmi video. ewm

## 2014-07-25 ENCOUNTER — Encounter: Payer: Self-pay | Admitting: Family Medicine

## 2014-07-25 ENCOUNTER — Other Ambulatory Visit: Payer: Self-pay | Admitting: Family Medicine

## 2014-07-25 MED ORDER — LEVOTHYROXINE SODIUM 25 MCG PO TABS: 25.0000 ug | ORAL_TABLET | Freq: Every day | ORAL | Status: DC

## 2014-07-26 ENCOUNTER — Other Ambulatory Visit: Payer: Self-pay

## 2014-07-26 MED ORDER — TRIAMCINOLONE ACETONIDE 0.1 % EX CREA: 1.0000 "application " | TOPICAL_CREAM | Freq: Two times a day (BID) | CUTANEOUS | Status: DC

## 2014-08-01 ENCOUNTER — Ambulatory Visit: Payer: 59 | Admitting: Dietician

## 2014-08-04 ENCOUNTER — Ambulatory Visit (AMBULATORY_SURGERY_CENTER): Payer: BLUE CROSS/BLUE SHIELD | Admitting: Internal Medicine

## 2014-08-04 ENCOUNTER — Encounter: Payer: Self-pay | Admitting: Internal Medicine

## 2014-08-04 VITALS — BP 140/89 | HR 70 | Temp 97.3°F | Resp 16 | Ht 67.0 in | Wt 229.0 lb

## 2014-08-04 DIAGNOSIS — Z1211 Encounter for screening for malignant neoplasm of colon: Secondary | ICD-10-CM

## 2014-08-04 MED ORDER — SODIUM CHLORIDE 0.9 % IV SOLN: 500.0000 mL | INTRAVENOUS | Status: DC

## 2014-08-04 NOTE — Progress Notes (Signed)
Stable to RR 

## 2014-08-04 NOTE — Patient Instructions (Addendum)
Your colonoscopy was normal. Prostate normal also.  Next routine colonoscopy in 10 years - 2026  I appreciate the opportunity to care for you. Iva Booparl E. Bracha Frankowski, MD, FACG  YOU HAD AN ENDOSCOPIC PROCEDURE TODAY AT THE [facility]: Refer to the procedure report that was given to you for any specific questions about what was found during the examination.  If the procedure report does not answer your questions, please call your gastroenterologist to clarify.  If you requested that your care partner not be given the details of your procedure findings, then the procedure report has been included in a sealed envelope for you to review at your convenience later.  YOU SHOULD EXPECT: Some feelings of bloating in the abdomen. Passage of more gas than usual.  Walking can help get rid of the air that was put into your GI tract during the procedure and reduce the bloating. If you had a lower endoscopy (such as a colonoscopy or flexible sigmoidoscopy) you may notice spotting of blood in your stool or on the toilet paper. If you underwent a bowel prep for your procedure, then you may not have a normal bowel movement for a few days.  DIET: Your first meal following the procedure should be a light meal and then it is ok to progress to your normal diet.  A half-sandwich or bowl of soup is an example of a good first meal.  Heavy or fried foods are harder to digest and may make you feel nauseous or bloated.  Likewise meals heavy in dairy and vegetables can cause extra gas to form and this can also increase the bloating.  Drink plenty of fluids but you should avoid alcoholic beverages for 24 hours.  ACTIVITY: Your care partner should take you home directly after the procedure.  You should plan to take it easy, moving slowly for the rest of the day.  You can resume normal activity the day after the procedure however you should NOT DRIVE or use heavy machinery for 24 hours (because of the sedation  medicines used during the test).    SYMPTOMS TO REPORT IMMEDIATELY: A gastroenterologist can be reached at any hour.  During normal business hours, 8:30 AM to 5:00 PM Monday through Friday, call [phone].  After hours and on weekends, please call the GI answering service at [phone] who will take a message and have the physician on call contact you.   Following lower endoscopy (colonoscopy or flexible sigmoidoscopy):  Excessive amounts of blood in the stool  Significant tenderness or worsening of abdominal pains  Swelling of the abdomen that is new, acute  Fever of 100F or higher  Following upper endoscopy (EGD)  Vomiting of blood or coffee ground material  New chest pain or pain under the shoulder blades  Painful or persistently difficult swallowing  New shortness of breath  Fever of 100F or higher  Black, tarry-looking stools  FOLLOW UP: If any biopsies were taken you will be contacted by phone or by letter within the next 1-3 weeks.  Call your gastroenterologist if you have not heard about the biopsies in 3 weeks.  Our staff will call the home number listed on your records the next business day following your procedure to check on you and address any questions or concerns that you may have at that time regarding the information given to you following your procedure. This is a courtesy call and so if there is no answer at the home number and  we have not heard from you through the emergency physician on call, we will assume that you have returned to your regular daily activities without incident.  SIGNATURES/CONFIDENTIALITY: You and/or your care partner have signed paperwork which will be entered into your electronic medical record.  These signatures attest to the fact that that the information above on your After Visit Summary has been reviewed and is understood.  Full responsibility of the confidentiality of this discharge information lies with you and/or your  care-partner.

## 2014-08-04 NOTE — Op Note (Signed)
[facility] [address] [place], [id number]   COLONOSCOPY PROCEDURE REPORT  PATIENT: Luke Rice, Abimelec  MR#: [id number] BIRTHDATE: Feb 10, 1961 , 53  yrs. old GENDER: male ENDOSCOPIST: Iva Booparl E Brecken Dewoody, MD, [facility] PROCEDURE DATE:  08/04/2014 PROCEDURE:   Colonoscopy, screening First Screening Colonoscopy - Avg.  risk and is 50 yrs.  old or older Yes.  Prior Negative Screening - Now for repeat screening. N/A  History of Adenoma - Now for follow-up colonoscopy & has been > or = to 3 yrs.  N/A  Polyps Removed Today? No.  Polyps Removed Today? No.  Recommend repeat exam, <10 yrs? Polyps Removed Today? No.  Recommend repeat exam, <10 yrs? No. ASA CLASS:   Class II INDICATIONS:average risk patient for colorectal cancer. MEDICATIONS: Propofol 400 mg IV, Monitored anesthesia care, and Lidocaine 40 mg IV  DESCRIPTION OF PROCEDURE:   After the risks benefits and alternatives of the procedure were thoroughly explained, informed consent was obtained.  The digital rectal exam revealed no abnormalities of the rectum, revealed no prostatic nodules, and revealed the prostate was not enlarged.   The LB [id number] endoscope was introduced through the anus and advanced to the cecum, which was identified by both the appendix and ileocecal valve. No adverse events experienced.   The quality of the prep was excellent, using MiraLax  The instrument was then slowly withdrawn as the colon was fully examined.      COLON FINDINGS: A normal appearing cecum, ileocecal valve, and appendiceal orifice were identified.  The ascending, transverse, descending, sigmoid colon, and rectum appeared unremarkable. Right colon retroflexion included.  Retroflexed views revealed no abnormalities. The time to cecum=1 minutes 39 seconds.  Withdrawal time=9 minutes 34 seconds.  The scope was withdrawn and the procedure completed. COMPLICATIONS: There were no immediate complications.  ENDOSCOPIC  IMPRESSION: Normal colonoscopy - excellent prep - first screening  RECOMMENDATIONS: Repeat colonoscopy 10 years.  eSigned:  Iva Booparl E Chrystopher Stangl, MD, [facility] 08/04/2014 8:48 AM   cc: Dr. Tana ConchStephen Hunter and The Patient

## 2014-08-05 ENCOUNTER — Telehealth: Payer: Self-pay | Admitting: *Deleted

## 2014-08-05 NOTE — Telephone Encounter (Signed)
On f/u callback received recording your call can not be completed as dialed . Attempted x3

## 2014-09-03 LAB — BASIC METABOLIC PANEL
BUN: 14 mg/dL (ref 4–21)
Creatinine: 0.9 mg/dL (ref <=1.3)
Glucose: 119 mg/dL
Potassium: 4.6 mmol/L (ref 3.4–5.3)
Sodium: 140 mmol/L (ref 137–147)

## 2014-09-03 LAB — LIPID PANEL
Cholesterol: 194 mg/dL (ref 0–200)
HDL: 51 mg/dL (ref 35–70)
LDL Cholesterol: 100 mg/dL
Triglycerides: 215 mg/dL — AB (ref 40–160)

## 2014-09-03 LAB — HEPATIC FUNCTION PANEL
ALT: 30 U/L (ref 10–40)
AST: 20 U/L (ref 14–40)
Alkaline Phosphatase: 45 U/L (ref 25–125)
Bilirubin, Total: 0.6 mg/dL

## 2014-09-03 LAB — CBC AND DIFFERENTIAL
HCT: 45 % (ref 41–53)
Hemoglobin: 15.2 g/dL (ref 13.5–17.5)
Platelets: 276 10*3/uL (ref 150–399)
WBC: 6.2 10^3/mL

## 2014-09-03 LAB — TSH: TSH: 4.82 u[IU]/mL (ref <=5.90)

## 2014-09-03 LAB — PSA: PSA: 0.66

## 2014-09-03 LAB — HEMOGLOBIN A1C: Hgb A1c MFr Bld: 6.1 % — AB (ref 4.0–6.0)

## 2014-09-08 ENCOUNTER — Encounter: Payer: Self-pay | Admitting: Family Medicine

## 2014-09-08 MED ORDER — CIPROFLOXACIN HCL 500 MG PO TABS: 500.0000 mg | ORAL_TABLET | Freq: Two times a day (BID) | ORAL | Status: DC

## 2014-09-24 ENCOUNTER — Encounter: Payer: Self-pay | Admitting: Family Medicine

## 2014-09-25 NOTE — Telephone Encounter (Signed)
[facility]

## 2014-10-15 ENCOUNTER — Encounter: Payer: Self-pay | Admitting: Family Medicine

## 2014-10-16 ENCOUNTER — Encounter: Payer: Self-pay | Admitting: Family Medicine

## 2014-10-16 ENCOUNTER — Ambulatory Visit (INDEPENDENT_AMBULATORY_CARE_PROVIDER_SITE_OTHER): Payer: BLUE CROSS/BLUE SHIELD | Admitting: Family Medicine

## 2014-10-16 VITALS — BP 120/74 | HR 76 | Temp 98.7°F | Wt 234.0 lb

## 2014-10-16 DIAGNOSIS — R0789 Other chest pain: Secondary | ICD-10-CM

## 2014-10-16 MED ORDER — ATORVASTATIN CALCIUM 20 MG PO TABS: 20.0000 mg | ORAL_TABLET | Freq: Every day | ORAL | Status: DC

## 2014-10-16 MED ORDER — NITROGLYCERIN 0.4 MG SL SUBL: 0.4000 mg | SUBLINGUAL_TABLET | SUBLINGUAL | Status: DC | PRN

## 2014-10-16 NOTE — Progress Notes (Signed)
Tana ConchStephen Hunter, MD Phone: [phone]  Subjective:   Luke Rice is a 54 y.o. year old very pleasant male patient who presents with the following:  Chest Pain -Started working out routinely with a Designer, television/film setdrill seargeant. When he would start exercising, would get a discomfort in his chest and felt like HR was elevating. Describes as a mild tightness across center chest without radiation. Admits to some shortness of breath perhaps more than expected. Would continue to occur if he went up stairs after the exercising. With rest and clearing mind chest discomfort would get better. DId occur in sleep x1 and woke him up.   Skipped exercise class and was walking around the block and got short of breath which is not typical for him along with the tightness in chest.   History of anxiety with some mild shortness of breath. Amitriptyline taken in 80s for HA with similar feeling of chest discomfort at that time but never reproducible with exercise at that time. Was told to cut back on caffeine at that time which seemed to help.   ROS- no nausea, vomiting. Did have diaphoresis with episodes while exercising. No left arm or neck apin.   Past Medical History- Patient Active Problem List   Diagnosis Date Noted  . Hypothyroidism     Priority: Medium  . Elevated blood pressure 01/22/2014    Priority: Low  . Obesity 01/22/2014    Priority: Low  . Migraine 01/22/2014    Priority: Low  . Psoriasis     Priority: Low  . Plantar fasciitis of left foot     Priority: Low  . BPH (benign prostatic hyperplasia)     Priority: Low   Medications- reviewed and updated Current Outpatient Prescriptions  Medication Sig Dispense Refill  . aspirin 81 MG tablet Take 81 mg by mouth daily.    Marland Kitchen. levothyroxine (SYNTHROID, LEVOTHROID) 25 MCG tablet Take 1 tablet (25 mcg total) by mouth daily before breakfast. 90 tablet 1  . ibuprofen (ADVIL,MOTRIN) 200 MG tablet Take 800 mg by mouth every 6 (six) hours as needed (pain).      . triamcinolone cream (KENALOG) 0.1 % Apply 1 application topically 2 (two) times daily. For 2 weeks then 1 week break to spots on back (Patient not taking: Reported on 10/16/2014) 85.2 g 0   Objective: BP 120/74 mmHg  Pulse 76  Temp(Src) 98.7 F (37.1 C)  Wt 234 lb (106.142 kg) Gen: NAD, resting comfortably CV: RRR no murmurs rubs or gallops No chest wall discomfort Lungs: CTAB no crackles, wheeze, rhonchi Abdomen: soft/nontender/nondistended/normal bowel sounds. No rebound or guarding.  Ext: no edema Skin: warm, dry, no rash over chest  EKG NSR rate 79. Left axis. Right bundle branch block with prolonged QRS. No hypertrophy. No significant st or t wave changes.   Assessment/Plan:  Chest Pain Typical features concerning for cardiac etiology including exertional chest pain relieved by rest. Risk factors (no HTN, HLD mild poor control, at risk Dm a1c 6.1, obesity, male). Discussed with Dr. Graciela HusbandsKlein and with pretest probability may be reasonable to proceed to cath. I did not order a stress test today but instead got him scheduled for cardiology visit tomorrow.   He is to take an aspirin and statin when he gets home. After cards eval, will start thyroid medication for hypothyroidism. Nitroglycerin provided as well as strict return precautions to call 911.   Meds ordered this encounter  Medications  . atorvastatin (LIPITOR) 20 MG tablet    Sig: Take 1  tablet (20 mg total) by mouth daily.    Dispense:  30 tablet    Refill:  5  . nitroGLYCERIN (NITROSTAT) 0.4 MG SL tablet    Sig: Place 1 tablet (0.4 mg total) under the tongue every 5 (five) minutes as needed for chest pain (3 doses max. Call 911 if not relieved.).    Dispense:  30 tablet    Refill:  1

## 2014-10-16 NOTE — Telephone Encounter (Signed)
Patient will be here at 4

## 2014-10-16 NOTE — Patient Instructions (Addendum)
-  start aspirin 81 mg -start thyroid medicine -Start atorvastatin 20mg   See cardiology tomorrow for consideration of stress test vs. Catheterization.   Take it easy tonight and do not exercise.   If you had chest pain this evening, you could take nitroglycerin I sent in. If your chest pain does not resolve within 3 doses, please seek care

## 2014-10-16 NOTE — Telephone Encounter (Signed)
We need to reach out to patient. If he is having chest tightness and heart racing, he absolutely needs to be seen. I am willing to see him this afternoon at 4pm and will need EKG certainly.

## 2014-10-17 ENCOUNTER — Encounter: Payer: Self-pay | Admitting: Internal Medicine

## 2014-10-17 ENCOUNTER — Ambulatory Visit (INDEPENDENT_AMBULATORY_CARE_PROVIDER_SITE_OTHER): Payer: BLUE CROSS/BLUE SHIELD | Admitting: Internal Medicine

## 2014-10-17 ENCOUNTER — Encounter: Payer: Self-pay | Admitting: Family Medicine

## 2014-10-17 VITALS — BP 130/86 | HR 74 | Ht 67.0 in | Wt 234.6 lb

## 2014-10-17 DIAGNOSIS — R079 Chest pain, unspecified: Secondary | ICD-10-CM | POA: Diagnosis not present

## 2014-10-17 NOTE — Progress Notes (Signed)
Cardiology Office Note   Date:  10/17/2014   ID:  Ezrael Sam, DOB [birth year], MRN [id number]  PCP:  Tana Conch, MD  Cardiologist:  Georgena Spurling, PA-C   Chief Complaint  Patient presents with  . Chest Pain    History of Present Illness: Zoran Yankee is a 54 y.o. male with a history of hypothyroidism and BPH, but no cardiac issues.   Luke Rice presents for evaluation of chest pain.  He does not have a long history of chest pain. Recently, he started an exercise program and was exerting himself more than usual. With the exertion, he had substernal chest tightness that reached 5/10. His symptoms were relieved by rest. He was diaphoretic because of the workout, but had no nausea, vomiting or diaphoresis.  Since then, he has had several other episodes of chest pain with exertion. He also had an episode of chest pain that woke him one night, but he thinks he might have been having a bad dream. He saw his primary care physician for these episodes. His primary care M.D. gave him a prescription for sublingual nitroglycerin which he is being carrying, and arranged the appointment with cardiology today.  The last episode of chest pain was last night when he was on his way to the movie. He was was in a hurry but was not running or out of breath. He developed chest tightness and took a sublingual nitroglycerin, which relieved his symptoms. He has not had chest pain since then. In the office today, he is symptom-free.  He denies any recent illnesses, long periods travel, immobilization, or any other medical problems. He has no GI issues. He was recently started on Lipitor by his primary M.D. and it was recommended that he take a baby aspirin daily as well. He is compliant with these and other medications.  Of note, he is very concerned because he is supposed to leave in 3 days for a significant business trip which he does not wish to cancel.  Past Medical History  Diagnosis Date    . Psoriasis   . Plantar fasciitis of left foot   . Hypothyroidism     patient questionable about diagnosis  . BPH (benign prostatic hyperplasia)     [place], not currently as bothresome  . Renal cyst 01/22/2014    17mm left kidney. Bozniak Class I in discussion with [facility]. No further follow up.    . Obesity     Past Surgical History  Procedure Laterality Date  . Colonoscopy      30s. concern for diverticulitis.     Current Outpatient Prescriptions  Medication Sig Dispense Refill  . aspirin 81 MG tablet Take 81 mg by mouth daily.    Marland Kitchen atorvastatin (LIPITOR) 20 MG tablet Take 1 tablet (20 mg total) by mouth daily. 30 tablet 5  . ibuprofen (ADVIL,MOTRIN) 200 MG tablet Take 800 mg by mouth every 6 (six) hours as needed (pain).    Marland Kitchen levothyroxine (SYNTHROID, LEVOTHROID) 25 MCG tablet Take 1 tablet (25 mcg total) by mouth daily before breakfast. 90 tablet 1  . nitroGLYCERIN (NITROSTAT) 0.4 MG SL tablet Place 1 tablet (0.4 mg total) under the tongue every 5 (five) minutes as needed for chest pain (3 doses max. Call 911 if not relieved.). 30 tablet 1   No current facility-administered medications for this visit.    Allergies:   Codeine   Social History:  The patient  reports that he has never smoked. He has never used smokeless tobacco. He reports that he drinks about 1.8 oz of alcohol per week. He reports that he does not use illicit drugs.   Family History:  The patient's family history includes Bipolar disorder in his brother; Diabetes in his brother; Hypothyroidism in his mother and sister; Lung cancer in an other family member. There is no history of Colon cancer, Rectal cancer, or Stomach cancer.    ROS:  Please see the history of present illness. All other systems are reviewed and negative.    PHYSICAL EXAM: VS:  BP 130/86 mmHg  Pulse 74  Ht 5\' 7"  (1.702 m)  Wt 234 lb 9.6 oz (106.414 kg)  BMI 36.73 kg/m2 , BMI  Body mass index is 36.73 kg/(m^2). GEN: Well nourished, well developed, in no acute distress HEENT: normal Neck: no JVD, carotid bruits, or masses Cardiac: RRR; soft murmur, rubs, or gallops,no edema  Respiratory:  clear to auscultation bilaterally, normal work of breathing GI: soft, nontender, nondistended, + BS MS: no deformity or atrophy Skin: warm and dry, no rash Neuro:  Strength and sensation are intact Psych: euthymic mood, full affect   EKG:  EKG is ordered today. The ekg ordered today demonstrates right bundle branch block and left anterior fascicular block, seen on ECG yesterday. No other old ECGs are available for comparison.  Recent Labs: 09/03/2014: ALT 30; BUN 14; Creatinine 0.9; Hemoglobin 15.2; Platelets 276; Potassium 4.6; Sodium 140; TSH 4.82    Lipid Panel    Component Value Date/Time   CHOL 194 09/03/2014   TRIG 215* 09/03/2014   HDL 51 09/03/2014   CHOLHDL 5 01/22/2014 1011   VLDL 26.6 01/22/2014 1011   LDLCALC 100 09/03/2014     Wt Readings from Last 3 Encounters:  10/17/14 234 lb 9.6 oz (106.414 kg)  10/16/14 234 lb (106.142 kg)  08/04/14 229 lb (103.874 kg)     Other studies Reviewed: Additional studies/ records that were reviewed today include: Records from his primary M.D.  ASSESSMENT AND PLAN:  1.  Exertional chest pain: His symptoms are concerning for angina. He has an elevated pretest probability of CAD because of his cardiac risk factors of age, gender, hyperlipidemia, borderline diabetes and borderline hypertension.  The options for evaluation of his chest pain were discussed with the patient including stress testing and heart catheterization. The risks and benefits of each procedure were reviewed.   Mr. Renato Battlesaley greatly prefers stress testing and admits he will feel much more comfortable if we can arrange this prior to his business trip on Monday.   To that end, an order was written for an exercise Myoview and it will be done at the hospital  tomorrow. The nuclear medicine Department was contacted and the staff here is working to pre-certify his test. He is aware of the need to be nothing by mouth after midnight, and will check and at the hospital on 04/23 at 8 AM. He is to continue his aspirin, statin and use the sublingual nitroglycerin when necessary. Mr. Renato Battlesaley understands that if the stress test is positive, he will need a heart catheterization.   Current medicines are reviewed at length with the patient today.  The patient does not have concerns regarding medicines.  The following changes have been made:  no change  Labs/ tests ordered today include:   Orders Placed This Encounter  Procedures  . Myocardial Perfusion Imaging  . EKG 12-Lead   Disposition:   FU with  a cardiologist after the test.   Signed, Theodore Demark, PA-C  10/17/2014 2:03 PM    [facility] [address] [place], [place], [place]  [address] Phone: [phone]; Fax: [phone]    Cardiology Attending  I concur with the plan as noted above. I did not see the patient.   Leonia Reeves.D.

## 2014-10-17 NOTE — Patient Instructions (Addendum)
Medication Instructions:  None  Labwork: None  Testing/Procedures: Your physician has requested that you have en exercise stress [facility]iew [facility]. For further information please visit [email or web address]. Please follow instruction sheet, as given.   Follow-Up: Your physician recommends that you schedule a follow-up appointment after your stress test is completed with one of our Cardiologist that you would like to establish with.    Any Other Special Instructions Will Be Listed Below (If Applicable).

## 2014-10-18 ENCOUNTER — Ambulatory Visit (HOSPITAL_COMMUNITY)
Admission: RE | Admit: 2014-10-18 | Discharge: 2014-10-18 | Disposition: A | Discharge status: Home / Self Care | Payer: BLUE CROSS/BLUE SHIELD | Source: Ambulatory Visit | Attending: Physician Assistant | Admitting: Physician Assistant

## 2014-10-18 ENCOUNTER — Encounter: Payer: Self-pay | Admitting: Physician Assistant

## 2014-10-18 ENCOUNTER — Other Ambulatory Visit: Payer: Self-pay | Admitting: Physician Assistant

## 2014-10-18 ENCOUNTER — Other Ambulatory Visit: Payer: Self-pay

## 2014-10-18 DIAGNOSIS — R079 Chest pain, unspecified: Secondary | ICD-10-CM

## 2014-10-18 DIAGNOSIS — R739 Hyperglycemia, unspecified: Secondary | ICD-10-CM | POA: Diagnosis not present

## 2014-10-18 DIAGNOSIS — I209 Angina pectoris, unspecified: Secondary | ICD-10-CM

## 2014-10-18 DIAGNOSIS — E039 Hypothyroidism, unspecified: Secondary | ICD-10-CM | POA: Diagnosis not present

## 2014-10-18 DIAGNOSIS — M722 Plantar fascial fibromatosis: Secondary | ICD-10-CM | POA: Diagnosis not present

## 2014-10-18 DIAGNOSIS — I4510 Unspecified right bundle-branch block: Secondary | ICD-10-CM | POA: Diagnosis not present

## 2014-10-18 DIAGNOSIS — L409 Psoriasis, unspecified: Secondary | ICD-10-CM | POA: Diagnosis not present

## 2014-10-18 DIAGNOSIS — E785 Hyperlipidemia, unspecified: Secondary | ICD-10-CM | POA: Diagnosis not present

## 2014-10-18 DIAGNOSIS — I255 Ischemic cardiomyopathy: Secondary | ICD-10-CM | POA: Diagnosis not present

## 2014-10-18 DIAGNOSIS — Z7982 Long term (current) use of aspirin: Secondary | ICD-10-CM | POA: Diagnosis not present

## 2014-10-18 DIAGNOSIS — N400 Enlarged prostate without lower urinary tract symptoms: Secondary | ICD-10-CM | POA: Diagnosis not present

## 2014-10-18 DIAGNOSIS — I2582 Chronic total occlusion of coronary artery: Secondary | ICD-10-CM | POA: Diagnosis not present

## 2014-10-18 DIAGNOSIS — Z6837 Body mass index (BMI) 37.0-37.9, adult: Secondary | ICD-10-CM | POA: Diagnosis not present

## 2014-10-18 DIAGNOSIS — E669 Obesity, unspecified: Secondary | ICD-10-CM | POA: Diagnosis not present

## 2014-10-18 DIAGNOSIS — I25110 Atherosclerotic heart disease of native coronary artery with unstable angina pectoris: Secondary | ICD-10-CM | POA: Diagnosis present

## 2014-10-18 LAB — CBC
HCT: 43.4 % (ref 39.0–52.0)
Hemoglobin: 14.2 g/dL (ref 13.0–17.0)
MCH: 28.2 pg (ref 26.0–34.0)
MCHC: 32.7 g/dL (ref 30.0–36.0)
MCV: 86.1 fL (ref 78.0–100.0)
Platelets: 217 10*3/uL (ref 150–400)
RBC: 5.04 MIL/uL (ref 4.22–5.81)
RDW: 13.4 % (ref 11.5–15.5)
WBC: 6.6 10*3/uL (ref 4.0–10.5)

## 2014-10-18 LAB — BASIC METABOLIC PANEL
Anion gap: 9 (ref 5–15)
BUN: 15 mg/dL (ref 6–23)
CO2: 24 mmol/L (ref 19–32)
Calcium: 9.4 mg/dL (ref 8.4–10.5)
Chloride: 105 mmol/L (ref 96–112)
Creatinine, Ser: 0.94 mg/dL (ref 0.50–1.35)
GFR calc Af Amer: >90 mL/min (ref >90)
GFR calc non Af Amer: >90 mL/min (ref >90)
Glucose, Bld: 102 mg/dL — ABNORMAL HIGH (ref 70–99)
Potassium: 4.4 mmol/L (ref 3.5–5.1)
Sodium: 138 mmol/L (ref 135–145)

## 2014-10-18 LAB — PROTIME-INR
INR: 1.01 (ref 0.00–1.49)
Prothrombin Time: 13.4 seconds (ref 11.6–15.2)

## 2014-10-18 MED ORDER — METOPROLOL TARTRATE 25 MG PO TABS: 12.5000 mg | ORAL_TABLET | Freq: Two times a day (BID) | ORAL | Status: DC

## 2014-10-18 MED ORDER — TECHNETIUM TC 99M SESTAMIBI GENERIC - CARDIOLITE
30.0000 | Freq: Once | INTRAVENOUS | Status: AC | PRN
  Administered 2014-10-18: 30 via INTRAVENOUS

## 2014-10-18 MED ORDER — NITROGLYCERIN 0.4 MG SL SUBL
SUBLINGUAL_TABLET | SUBLINGUAL | Status: AC
  Filled 2014-10-18: qty 1

## 2014-10-18 MED ORDER — TECHNETIUM TC 99M SESTAMIBI GENERIC - CARDIOLITE
10.0000 | Freq: Once | INTRAVENOUS | Status: AC | PRN
  Administered 2014-10-18: 10 via INTRAVENOUS

## 2014-10-18 MED ORDER — METOPROLOL TARTRATE 50 MG PO TABS
ORAL_TABLET | ORAL | Status: AC
  Filled 2014-10-18: qty 1

## 2014-10-18 MED ORDER — NITROGLYCERIN 0.4 MG SL SUBL
0.4000 mg | SUBLINGUAL_TABLET | SUBLINGUAL | Status: DC | PRN
  Administered 2014-10-18: 0.4 mg via SUBLINGUAL

## 2014-10-18 MED ORDER — METOPROLOL TARTRATE 12.5 MG HALF TABLET
12.5000 mg | ORAL_TABLET | Freq: Once | ORAL | Status: AC
  Administered 2014-10-18: 12.5 mg via ORAL
  Filled 2014-10-18: qty 1

## 2014-10-18 NOTE — Progress Notes (Signed)
No CP prior to exercise.  3+ ST depression developed in V3-6 with 3/10 chest pain during the treadmill.  Mr. Luke Rice was given one SL  NTG with resolution of pain.  ST depression resolved.  He was started on 12.5 mg BID lopressor.  First dose given while here.  Precath labs drawn and CXR.  Discussed with Dr. Patty SermonsBrackbill.  Left heart cath Monday.  Patient instructed: NPO after MN Sunday.  No exercise or work.  Wait for call Monday morning from scheduling staff.   He has SL NTG if needed.  Also told when to come to ER.    [name], PAC

## 2014-10-18 NOTE — Progress Notes (Unsigned)
Patient ID: Luke Rice, male   DOB: 06/29/1960, 54 y.o.   MRN: [id number]    Risks include but are not limited to bleeding, infection, vascular injury, stroke, myocardial infection, arrhythmia, kidney injury, radiation-related injury in the case of prolonged fluoroscopy use, emergency cardiac surgery, and death. The patient understands the risks of serious complication is low (<1%).

## 2014-10-18 NOTE — Progress Notes (Signed)
Pt experienced chest pain while on treadmill, sl NTG given, po lopressor 12.5 mg given per order pre op labs drawn and sent escorted to x-ray for CXR

## 2014-10-20 ENCOUNTER — Encounter (HOSPITAL_COMMUNITY)
Admission: AD | Disposition: A | Discharge status: Home / Self Care | Payer: BLUE CROSS/BLUE SHIELD | Source: Ambulatory Visit | Attending: Cardiology

## 2014-10-20 ENCOUNTER — Ambulatory Visit (HOSPITAL_COMMUNITY)
Admission: AD | Admit: 2014-10-20 | Discharge: 2014-10-21 | Disposition: A | Discharge status: Home / Self Care | Payer: BLUE CROSS/BLUE SHIELD | Source: Ambulatory Visit | Attending: Cardiology | Admitting: Cardiology

## 2014-10-20 ENCOUNTER — Other Ambulatory Visit: Payer: Self-pay

## 2014-10-20 ENCOUNTER — Encounter (HOSPITAL_COMMUNITY): Payer: Self-pay | Admitting: General Practice

## 2014-10-20 DIAGNOSIS — I25110 Atherosclerotic heart disease of native coronary artery with unstable angina pectoris: Secondary | ICD-10-CM

## 2014-10-20 DIAGNOSIS — I2582 Chronic total occlusion of coronary artery: Secondary | ICD-10-CM | POA: Diagnosis not present

## 2014-10-20 DIAGNOSIS — Z7982 Long term (current) use of aspirin: Secondary | ICD-10-CM | POA: Insufficient documentation

## 2014-10-20 DIAGNOSIS — L409 Psoriasis, unspecified: Secondary | ICD-10-CM | POA: Insufficient documentation

## 2014-10-20 DIAGNOSIS — I200 Unstable angina: Secondary | ICD-10-CM | POA: Diagnosis present

## 2014-10-20 DIAGNOSIS — N400 Enlarged prostate without lower urinary tract symptoms: Secondary | ICD-10-CM | POA: Diagnosis not present

## 2014-10-20 DIAGNOSIS — E039 Hypothyroidism, unspecified: Secondary | ICD-10-CM | POA: Diagnosis not present

## 2014-10-20 DIAGNOSIS — Z6837 Body mass index (BMI) 37.0-37.9, adult: Secondary | ICD-10-CM | POA: Insufficient documentation

## 2014-10-20 DIAGNOSIS — R9439 Abnormal result of other cardiovascular function study: Secondary | ICD-10-CM | POA: Diagnosis present

## 2014-10-20 DIAGNOSIS — Z955 Presence of coronary angioplasty implant and graft: Secondary | ICD-10-CM

## 2014-10-20 DIAGNOSIS — I2510 Atherosclerotic heart disease of native coronary artery without angina pectoris: Secondary | ICD-10-CM

## 2014-10-20 DIAGNOSIS — R739 Hyperglycemia, unspecified: Secondary | ICD-10-CM | POA: Insufficient documentation

## 2014-10-20 DIAGNOSIS — E785 Hyperlipidemia, unspecified: Secondary | ICD-10-CM

## 2014-10-20 DIAGNOSIS — I4510 Unspecified right bundle-branch block: Secondary | ICD-10-CM | POA: Diagnosis present

## 2014-10-20 DIAGNOSIS — I255 Ischemic cardiomyopathy: Secondary | ICD-10-CM | POA: Diagnosis present

## 2014-10-20 DIAGNOSIS — M722 Plantar fascial fibromatosis: Secondary | ICD-10-CM | POA: Insufficient documentation

## 2014-10-20 DIAGNOSIS — Z9861 Coronary angioplasty status: Secondary | ICD-10-CM

## 2014-10-20 DIAGNOSIS — E669 Obesity, unspecified: Secondary | ICD-10-CM | POA: Diagnosis present

## 2014-10-20 HISTORY — PX: CORONARY ANGIOPLASTY WITH STENT PLACEMENT: SHX49

## 2014-10-20 HISTORY — DX: Unspecified right bundle-branch block: I45.10

## 2014-10-20 HISTORY — DX: Ischemic cardiomyopathy: I25.5

## 2014-10-20 HISTORY — DX: Hyperglycemia, unspecified: R73.9

## 2014-10-20 HISTORY — DX: Hyperlipidemia, unspecified: E78.5

## 2014-10-20 HISTORY — DX: Atherosclerotic heart disease of native coronary artery without angina pectoris: I25.10

## 2014-10-20 HISTORY — PX: FRACTIONAL FLOW RESERVE WIRE: SHX5839

## 2014-10-20 HISTORY — PX: LEFT HEART CATHETERIZATION WITH CORONARY ANGIOGRAM: SHX5451

## 2014-10-20 HISTORY — PX: CARDIAC CATHETERIZATION: SHX172

## 2014-10-20 LAB — POCT ACTIVATED CLOTTING TIME: Activated Clotting Time: 657 seconds

## 2014-10-20 SURGERY — LEFT HEART CATHETERIZATION WITH CORONARY ANGIOGRAM
Anesthesia: LOCAL

## 2014-10-20 MED ORDER — LEVOTHYROXINE SODIUM 25 MCG PO TABS
25.0000 ug | ORAL_TABLET | Freq: Every day | ORAL | Status: DC
  Administered 2014-10-21: 25 ug via ORAL
  Filled 2014-10-20 (×2): qty 1

## 2014-10-20 MED ORDER — SODIUM CHLORIDE 0.9 % IV SOLN
INTRAVENOUS | Status: DC
  Administered 2014-10-20: 10:00:00 via INTRAVENOUS

## 2014-10-20 MED ORDER — TICAGRELOR 90 MG PO TABS
90.0000 mg | ORAL_TABLET | Freq: Two times a day (BID) | ORAL | Status: DC
  Administered 2014-10-20 – 2014-10-21 (×2): 90 mg via ORAL
  Filled 2014-10-20 (×4): qty 1

## 2014-10-20 MED ORDER — BIVALIRUDIN 250 MG IV SOLR
INTRAVENOUS | Status: AC
  Filled 2014-10-20: qty 250

## 2014-10-20 MED ORDER — NITROGLYCERIN 1 MG/10 ML FOR IR/CATH LAB
INTRA_ARTERIAL | Status: AC
  Filled 2014-10-20: qty 10

## 2014-10-20 MED ORDER — FENTANYL CITRATE (PF) 100 MCG/2ML IJ SOLN
INTRAMUSCULAR | Status: AC
  Filled 2014-10-20: qty 2

## 2014-10-20 MED ORDER — ASPIRIN 81 MG PO CHEW
CHEWABLE_TABLET | ORAL | Status: AC
  Filled 2014-10-20: qty 1

## 2014-10-20 MED ORDER — VERAPAMIL HCL 2.5 MG/ML IV SOLN
INTRAVENOUS | Status: AC
  Filled 2014-10-20: qty 2

## 2014-10-20 MED ORDER — HEPARIN SODIUM (PORCINE) 1000 UNIT/ML IJ SOLN
INTRAMUSCULAR | Status: AC
  Filled 2014-10-20: qty 1

## 2014-10-20 MED ORDER — SODIUM CHLORIDE 0.9 % IV SOLN
1.7500 mg/kg/h | INTRAVENOUS | Status: DC
  Filled 2014-10-20: qty 250

## 2014-10-20 MED ORDER — ASPIRIN 81 MG PO CHEW
81.0000 mg | CHEWABLE_TABLET | Freq: Every day | ORAL | Status: DC
  Administered 2014-10-21: 81 mg via ORAL
  Filled 2014-10-20: qty 1

## 2014-10-20 MED ORDER — SODIUM CHLORIDE 0.9 % IJ SOLN: 3.0000 mL | Freq: Two times a day (BID) | INTRAMUSCULAR | Status: DC

## 2014-10-20 MED ORDER — HEPARIN (PORCINE) IN NACL 2-0.9 UNIT/ML-% IJ SOLN
INTRAMUSCULAR | Status: AC
  Filled 2014-10-20: qty 1500

## 2014-10-20 MED ORDER — ACETAMINOPHEN 325 MG PO TABS
650.0000 mg | ORAL_TABLET | ORAL | Status: DC | PRN
  Administered 2014-10-20 – 2014-10-21 (×2): 650 mg via ORAL
  Filled 2014-10-20 (×2): qty 2

## 2014-10-20 MED ORDER — TICAGRELOR 90 MG PO TABS
ORAL_TABLET | ORAL | Status: AC
  Filled 2014-10-20: qty 2

## 2014-10-20 MED ORDER — SODIUM CHLORIDE 0.9 % IV SOLN: 250.0000 mL | INTRAVENOUS | Status: DC | PRN

## 2014-10-20 MED ORDER — SODIUM CHLORIDE 0.9 % IV SOLN: 1.0000 mL/kg/h | INTRAVENOUS | Status: AC

## 2014-10-20 MED ORDER — SODIUM CHLORIDE 0.9 % IJ SOLN: 3.0000 mL | INTRAMUSCULAR | Status: DC | PRN

## 2014-10-20 MED ORDER — LIDOCAINE HCL (PF) 1 % IJ SOLN
INTRAMUSCULAR | Status: AC
  Filled 2014-10-20: qty 30

## 2014-10-20 MED ORDER — NITROGLYCERIN 0.4 MG SL SUBL: 0.4000 mg | SUBLINGUAL_TABLET | SUBLINGUAL | Status: DC | PRN

## 2014-10-20 MED ORDER — ASPIRIN 81 MG PO CHEW
81.0000 mg | CHEWABLE_TABLET | ORAL | Status: AC
  Administered 2014-10-20: 81 mg via ORAL

## 2014-10-20 MED ORDER — ATORVASTATIN CALCIUM 80 MG PO TABS
80.0000 mg | ORAL_TABLET | Freq: Every day | ORAL | Status: DC
  Filled 2014-10-20 (×2): qty 1

## 2014-10-20 MED ORDER — MIDAZOLAM HCL 2 MG/2ML IJ SOLN
INTRAMUSCULAR | Status: AC
  Filled 2014-10-20: qty 2

## 2014-10-20 MED ORDER — METOPROLOL TARTRATE 25 MG PO TABS
25.0000 mg | ORAL_TABLET | Freq: Two times a day (BID) | ORAL | Status: DC
  Administered 2014-10-20: 25 mg via ORAL
  Filled 2014-10-20: qty 1

## 2014-10-20 MED ORDER — ADENOSINE 12 MG/4ML IV SOLN
16.0000 mL | INTRAVENOUS | Status: AC
  Administered 2014-10-20: 48 mg via INTRAVENOUS
  Filled 2014-10-20: qty 16

## 2014-10-20 MED ORDER — ONDANSETRON HCL 4 MG/2ML IJ SOLN: 4.0000 mg | Freq: Four times a day (QID) | INTRAMUSCULAR | Status: DC | PRN

## 2014-10-20 NOTE — Interval H&P Note (Signed)
History and Physical Interval Note:  10/20/2014 10:24 AM  Luke Rice  has presented today for surgery, with the diagnosis of positive stress test  The various methods of treatment have been discussed with the patient and family. After consideration of risks, benefits and other options for treatment, the patient has consented to  Procedure(s): LEFT HEART CATHETERIZATION WITH CORONARY ANGIOGRAM (N/A) as a surgical intervention .  The patient's history has been reviewed, patient examined, no change in status, stable for surgery.  I have reviewed the patient's chart and labs.  Questions were answered to the patient's satisfaction.    Cath Lab Visit (complete for each Cath Lab visit)  Clinical Evaluation Leading to the Procedure:   ACS: No.  Non-ACS:    Anginal Classification: CCS III  Anti-ischemic medical therapy: Minimal Therapy (1 class of medications)  Non-Invasive Test Results: High-risk stress test findings: cardiac mortality >3%/year  Prior CABG: No previous CABG       Theron Aristaeter [facility] LLCJordanMD,FACC 10/20/2014 10:25 AM

## 2014-10-20 NOTE — CV Procedure (Addendum)
Cardiac Catheterization Procedure Note  Name: Luke Rice MRN: [id number] DOB: 08-08-1960  Procedure: Left Heart Cath, Selective Coronary Angiography, LV angiography, PTCA and stenting of the proximal RCA, FFR of the mid LAD.  Indication: 54 yo WM presents with unstable angina. Abnormal nuclear stress test.   Procedural Details:  The right wrist was prepped, draped, and anesthetized with 1% lidocaine. Using the modified Seldinger technique, a 6 French slender sheath was introduced into the right radial artery. 3 mg of verapamil was administered through the sheath, weight-based unfractionated heparin was administered intravenously. Standard Judkins catheters were used for selective coronary angiography and left ventriculography. Catheter exchanges were performed over an exchange length guidewire.  PROCEDURAL FINDINGS Hemodynamics: AO 117/77 mean 96 mm Hg LV 117/14 mm Hg   Coronary angiography: Coronary dominance: right  Left mainstem: Normal  Left anterior descending (LAD): There is an eccentric 50-60% stenosis in the mid LAD.  There is a 50% stenosis in the distal LAD   Ramus Intermediate: Large vessel with 40% segmental stenosis in the proximal vessel.   Left circumflex (LCx): 30% mid LCx disease. The distal LCx is occluded prior to a terminal small OM branch. There are left to left collaterals to the distal OM.   Right coronary artery (RCA): 100% occlusion proximally. There are left to right collaterals to the RCA.   Left ventriculography: Left ventricular systolic function is abnormal. There is moderate hypokinesis of the basal to mid inferior wall.  LVEF is estimated at 45%, there is no significant mitral regurgitation   PCI Note:  Following the diagnostic procedure, the decision was made to proceed with FFR of the LAD followed by PCI of the RCA.  Weight-based bivalirudin was given for anticoagulation. Brilinta 180 mg was given orally. Once a therapeutic ACT was achieved, a 6  [place] XBLAD 3.5 guide catheter was inserted.  A prowater coronary guidewire was used to cross the lesion in the LAD.  The lesion was then crossed with an Assist device. Maximal hyperemia was obtained with IV Adenosine with FFR of 0.84 measured. this indicates that the LAD lesion is not hemodynamically significant and will be treated medically.  We next performed PCI of the proximal RCA which was clearly the culprit lesion. A 6 Fr. FR4 guide was inserted. A prowater coronary guidewire was used to cross the lesion. The lesion was predilated with a 2.5 mm balloon.  The lesion was then stented with a 3.0 x 38 mm Synergy stent.  The stent was postdilated with a 3.5 mm noncompliant balloon to 18 atm.  Following PCI, there was 0% residual stenosis and TIMI-3 flow. Final angiography confirmed an excellent result. There was residual disease in the distal RCA branches with 80% stenosis at the origin of a small PDA and 90% stenosis of the PLOM origin. The patient tolerated the procedure well. There were no immediate procedural complications. A TR band was used for radial hemostasis. The patient was transferred to the post catheterization recovery area for further monitoring. 140 cc of contrast used.   PCI Data: Vessel - RCA /Segment - proximal Percent Stenosis (pre)  100% TIMI-flow 0 Stent: 3.0 x 38 mm Synergy stent post dilated to 3.5. Percent Stenosis (post) 0% TIMI-flow (post) 3  Final Conclusions:   1. 2 vessel obstructive CAD.  2. Moderate mid LAD stenosis with FFR of 0.84 indicating that this lesion is not hemodynamically significant.  3. Mild LV dysfunction. 4. Successful stenting of the proximal RCA with DES.  Recommendations:  DAPT for one year. Will treat residual CAD in PDA/PLOM/ and distal LCx OM medically. Anticipate DC in am.   Andalyn Heckstall [place], [place] 10/20/2014, 11:53 AM

## 2014-10-20 NOTE — Care Management Note (Unsigned)
    Page 1 of 1   10/20/2014     3:29:52 PM CARE MANAGEMENT NOTE 10/20/2014  Patient:  Luke Rice,Luke Rice   Account Number:  [id number]  Date Initiated:  10/20/2014  Documentation initiated by:  Gae GallopOLE,ANGELA  Subjective/Objective Assessment:   PTA from home with wife, admitted with positive stress test, s/p cardiac cath./pci.     Action/Plan:   Return to home when medically stable. CM to f/u with d/c needs.   Anticipated DC Date:     Anticipated DC Plan:  HOME/SELF CARE      DC Planning Services  CM consult      Choice offered to / List presented to:             Status of service:  In process, will continue to follow Medicare Important Message given?   (If response is "NO", the following Medicare IM given date fields will be blank) Date Medicare IM given:   Medicare IM given by:   Date Additional Medicare IM given:   Additional Medicare IM given by:    Discharge Disposition:    Per UR Regulation:    If discussed at Long Length of Stay Meetings, dates discussed:    Comments:  10/20/2014 @ 1500 Gae GallopAngela Cole RN,BSN,CM [phone] CM provided pt with Brilinta pamphlet which includes 30 day free card. Pt stated is insured through [employer] for meds, benefit  check made per CMA and was told pt" s Marden NobleBrilinta would be covered @ 100%, can  get  med.  refilled 3  times with retail pharmacy after that it has to be mail ordered. Pt uses [facility] on [address], [phone], brilinta is in stock,  confirmed and  pt made aware per CM . No other needs identitfied @ present time.

## 2014-10-20 NOTE — H&P (View-Only) (Signed)
  Cardiology Office Note   Date:  10/17/2014   ID:  Jacobus Shiplett, DOB 07/24/1960, MRN [id number]  PCP:  HUNTER, STEPHEN, MD  Cardiologist:  New  Rhonda Barrett, PA-C   Chief Complaint  Patient presents with  . Chest Pain    History of Present Illness: Fahad Gatton is a 54 y.o. male with a history of hypothyroidism and BPH, but no cardiac issues.   Luke Rice presents for evaluation of chest pain.  He does not have a long history of chest pain. Recently, he started an exercise program and was exerting himself more than usual. With the exertion, he had substernal chest tightness that reached 5/10. His symptoms were relieved by rest. He was diaphoretic because of the workout, but had no nausea, vomiting or diaphoresis.  Since then, he has had several other episodes of chest pain with exertion. He also had an episode of chest pain that woke him one night, but he thinks he might have been having a bad dream. He saw his primary care physician for these episodes. His primary care M.D. gave him a prescription for sublingual nitroglycerin which he is being carrying, and arranged the appointment with cardiology today.  The last episode of chest pain was last night when he was on his way to the movie. He was was in a hurry but was not running or out of breath. He developed chest tightness and took a sublingual nitroglycerin, which relieved his symptoms. He has not had chest pain since then. In the office today, he is symptom-free.  He denies any recent illnesses, long periods travel, immobilization, or any other medical problems. He has no GI issues. He was recently started on Lipitor by his primary M.D. and it was recommended that he take a baby aspirin daily as well. He is compliant with these and other medications.  Of note, he is very concerned because he is supposed to leave in 3 days for a significant business trip which he does not wish to cancel.  Past Medical History  Diagnosis Date    . Psoriasis   . Plantar fasciitis of left foot   . Hypothyroidism     patient questionable about diagnosis  . BPH (benign prostatic hyperplasia)     [place], not currently as bothresome  . Renal cyst 01/22/2014    17mm left kidney. Bozniak Class I in discussion with [place] imaging radiologist. No further follow up.    . Obesity     Past Surgical History  Procedure Laterality Date  . Colonoscopy      30s. concern for diverticulitis.     Current Outpatient Prescriptions  Medication Sig Dispense Refill  . aspirin 81 MG tablet Take 81 mg by mouth daily.    . atorvastatin (LIPITOR) 20 MG tablet Take 1 tablet (20 mg total) by mouth daily. 30 tablet 5  . ibuprofen (ADVIL,MOTRIN) 200 MG tablet Take 800 mg by mouth every 6 (six) hours as needed (pain).    . levothyroxine (SYNTHROID, LEVOTHROID) 25 MCG tablet Take 1 tablet (25 mcg total) by mouth daily before breakfast. 90 tablet 1  . nitroGLYCERIN (NITROSTAT) 0.4 MG SL tablet Place 1 tablet (0.4 mg total) under the tongue every 5 (five) minutes as needed for chest pain (3 doses max. Call 911 if not relieved.). 30 tablet 1   No current facility-administered medications for this visit.    Allergies:   Codeine   Social History:  The patient    reports that he has never smoked. He has never used smokeless tobacco. He reports that he drinks about 1.8 oz of alcohol per week. He reports that he does not use illicit drugs.   Family History:  The patient's family history includes Bipolar disorder in his brother; Diabetes in his brother; Hypothyroidism in his mother and sister; Lung cancer in an other family member. There is no history of Colon cancer, Rectal cancer, or Stomach cancer.    ROS:  Please see the history of present illness. All other systems are reviewed and negative.    PHYSICAL EXAM: VS:  BP 130/86 mmHg  Pulse 74  Ht 5' 7" (1.702 m)  Wt 234 lb 9.6 oz (106.414 kg)  BMI 36.73 kg/m2 , BMI  Body mass index is 36.73 kg/(m^2). GEN: Well nourished, well developed, in no acute distress HEENT: normal Neck: no JVD, carotid bruits, or masses Cardiac: RRR; soft murmur, rubs, or gallops,no edema  Respiratory:  clear to auscultation bilaterally, normal work of breathing GI: soft, nontender, nondistended, + BS MS: no deformity or atrophy Skin: warm and dry, no rash Neuro:  Strength and sensation are intact Psych: euthymic mood, full affect   EKG:  EKG is ordered today. The ekg ordered today demonstrates right bundle branch block and left anterior fascicular block, seen on ECG yesterday. No other old ECGs are available for comparison.  Recent Labs: 09/03/2014: ALT 30; BUN 14; Creatinine 0.9; Hemoglobin 15.2; Platelets 276; Potassium 4.6; Sodium 140; TSH 4.82    Lipid Panel    Component Value Date/Time   CHOL 194 09/03/2014   TRIG 215* 09/03/2014   HDL 51 09/03/2014   CHOLHDL 5 01/22/2014 1011   VLDL 26.6 01/22/2014 1011   LDLCALC 100 09/03/2014     Wt Readings from Last 3 Encounters:  10/17/14 234 lb 9.6 oz (106.414 kg)  10/16/14 234 lb (106.142 kg)  08/04/14 229 lb (103.874 kg)     Other studies Reviewed: Additional studies/ records that were reviewed today include: Records from his primary M.D.  ASSESSMENT AND PLAN:  1.  Exertional chest pain: His symptoms are concerning for angina. He has an elevated pretest probability of CAD because of his cardiac risk factors of 54, gender, hyperlipidemia, borderline diabetes and borderline hypertension.  The options for evaluation of his chest pain were discussed with the patient including stress testing and heart catheterization. The risks and benefits of each procedure were reviewed.   Mr. Risenhoover greatly prefers stress testing and admits he will feel much more comfortable if we can arrange this prior to his business trip on Monday.   To that end, an order was written for an exercise Myoview and it will be done at the hospital  tomorrow. The nuclear medicine Department was contacted and the staff here is working to pre-certify his test. He is aware of the need to be nothing by mouth after midnight, and will check and at the hospital on 04/23 at 8 AM. He is to continue his aspirin, statin and use the sublingual nitroglycerin when necessary. Mr. Web understands that if the stress test is positive, he will need a heart catheterization.   Current medicines are reviewed at length with the patient today.  The patient does not have concerns regarding medicines.  The following changes have been made:  no change  Labs/ tests ordered today include:   Orders Placed This Encounter  Procedures  . Myocardial Perfusion Imaging  . EKG 12-Lead   Disposition:   FU with   a cardiologist after the test.   Signed, Rhonda Barrett, PA-C  10/17/2014 2:03 PM    [facility] [address], [place], [place]  [address] Phone: [phone]; Fax: [phone]    Cardiology Attending  I concur with the plan as noted above. I did not see the patient.   Gregg Taylor,M.D. 

## 2014-10-21 ENCOUNTER — Encounter (HOSPITAL_COMMUNITY): Payer: Self-pay | Admitting: Cardiology

## 2014-10-21 ENCOUNTER — Other Ambulatory Visit: Payer: Self-pay

## 2014-10-21 DIAGNOSIS — Z9861 Coronary angioplasty status: Secondary | ICD-10-CM

## 2014-10-21 DIAGNOSIS — I25110 Atherosclerotic heart disease of native coronary artery with unstable angina pectoris: Secondary | ICD-10-CM | POA: Diagnosis not present

## 2014-10-21 DIAGNOSIS — E785 Hyperlipidemia, unspecified: Secondary | ICD-10-CM | POA: Diagnosis not present

## 2014-10-21 DIAGNOSIS — N400 Enlarged prostate without lower urinary tract symptoms: Secondary | ICD-10-CM | POA: Diagnosis not present

## 2014-10-21 DIAGNOSIS — I200 Unstable angina: Secondary | ICD-10-CM | POA: Diagnosis not present

## 2014-10-21 DIAGNOSIS — R931 Abnormal findings on diagnostic imaging of heart and coronary circulation: Secondary | ICD-10-CM

## 2014-10-21 DIAGNOSIS — I2582 Chronic total occlusion of coronary artery: Secondary | ICD-10-CM | POA: Diagnosis not present

## 2014-10-21 DIAGNOSIS — I2510 Atherosclerotic heart disease of native coronary artery without angina pectoris: Secondary | ICD-10-CM

## 2014-10-21 DIAGNOSIS — I255 Ischemic cardiomyopathy: Secondary | ICD-10-CM

## 2014-10-21 DIAGNOSIS — E039 Hypothyroidism, unspecified: Secondary | ICD-10-CM | POA: Diagnosis not present

## 2014-10-21 DIAGNOSIS — E669 Obesity, unspecified: Secondary | ICD-10-CM

## 2014-10-21 DIAGNOSIS — I4510 Unspecified right bundle-branch block: Secondary | ICD-10-CM | POA: Diagnosis present

## 2014-10-21 LAB — CBC
HCT: 39.8 % (ref 39.0–52.0)
Hemoglobin: 12.9 g/dL — ABNORMAL LOW (ref 13.0–17.0)
MCH: 27.9 pg (ref 26.0–34.0)
MCHC: 32.4 g/dL (ref 30.0–36.0)
MCV: 86 fL (ref 78.0–100.0)
Platelets: 268 10*3/uL (ref 150–400)
RBC: 4.63 MIL/uL (ref 4.22–5.81)
RDW: 13.4 % (ref 11.5–15.5)
WBC: 8.7 10*3/uL (ref 4.0–10.5)

## 2014-10-21 LAB — BASIC METABOLIC PANEL
Anion gap: 9 (ref 5–15)
BUN: 11 mg/dL (ref 6–23)
CO2: 22 mmol/L (ref 19–32)
Calcium: 8.9 mg/dL (ref 8.4–10.5)
Chloride: 107 mmol/L (ref 96–112)
Creatinine, Ser: 0.86 mg/dL (ref 0.50–1.35)
GFR calc Af Amer: >90 mL/min (ref >90)
GFR calc non Af Amer: >90 mL/min (ref >90)
Glucose, Bld: 115 mg/dL — ABNORMAL HIGH (ref 70–99)
Potassium: 4.1 mmol/L (ref 3.5–5.1)
Sodium: 138 mmol/L (ref 135–145)

## 2014-10-21 MED ORDER — METOPROLOL TARTRATE 12.5 MG HALF TABLET
12.5000 mg | ORAL_TABLET | Freq: Two times a day (BID) | ORAL | Status: DC
  Administered 2014-10-21: 11:00:00 12.5 mg via ORAL
  Filled 2014-10-21: qty 1

## 2014-10-21 MED ORDER — ATORVASTATIN CALCIUM 80 MG PO TABS: 80.0000 mg | ORAL_TABLET | Freq: Every evening | ORAL | Status: DC

## 2014-10-21 MED ORDER — TICAGRELOR 90 MG PO TABS: 90.0000 mg | ORAL_TABLET | Freq: Two times a day (BID) | ORAL | Status: DC

## 2014-10-21 MED FILL — Sodium Chloride IV Soln 0.9%: INTRAVENOUS | Qty: 50 | Status: AC

## 2014-10-21 NOTE — Progress Notes (Signed)
Patient: Luke Rice / Admit Date: 10/20/2014 / Date of Encounter: 10/21/2014, 7:44 AM   Subjective: Feeling fine. No CP or SOB.   Objective: Telemetry: NSR, one PVC couplet Physical Exam: Blood pressure 106/66, pulse 76, temperature 98.3 F (36.8 C), temperature source Oral, resp. rate 20, height  (1.702 m), weight 237 lb 14 oz (107.9 kg), SpO2 95 %. General: Well developed, well nourished, in no acute distress. Head: Normocephalic, atraumatic, sclera non-icteric, no xanthomas, nares are without discharge. Neck: JVP not elevated. Lungs: Clear bilaterally to auscultation without wheezes, rales, or rhonchi. Breathing is unlabored. Heart: RRR S1 S2 without murmurs, rubs, or gallops.  Abdomen: Soft, non-tender, non-distended with normoactive bowel sounds. No rebound/guarding. Extremities: No clubbing or cyanosis. No edema. Distal pedal pulses are 2+ and equal bilaterally. Right radial cath site without hematoma, ecchymosis; good pulse Neuro: Alert and oriented X 3. Moves all extremities spontaneously. Psych:  Responds to questions appropriately with a normal affect.   Intake/Output Summary (Last 24 hours) at 10/21/14 0744 Last data filed at 10/20/14 2030  Gross per 24 hour  Intake 1692.8 ml  Output   1000 ml  Net  692.8 ml    Inpatient Medications:  . aspirin  81 mg Oral Daily  . atorvastatin  80 mg Oral q1800  . levothyroxine  25 mcg Oral QAC breakfast  . metoprolol tartrate  25 mg Oral BID  . ticagrelor  90 mg Oral BID   Infusions:    Labs:  Recent Labs  10/18/14 1042 10/21/14 0515  NA 138 138  K 4.4 4.1  CL 105 107  CO2 24 22  GLUCOSE 102* 115*  BUN 15 11  CREATININE 0.94 0.86  CALCIUM 9.4 8.9   No results for input(s): AST, ALT, ALKPHOS, BILITOT, PROT, ALBUMIN in the last 72 hours.  Recent Labs  10/18/14 1042 10/21/14 0515  WBC 6.6 8.7  HGB 14.2 12.9*  HCT 43.4 39.8  MCV 86.1 86.0  PLT 217 268   No results for input(s): CKTOTAL, CKMB, TROPONINI  in the last 72 hours. Invalid input(s): POCBNP No results for input(s): HGBA1C in the last 72 hours.   Radiology/Studies:  Dg Chest 2 View  10/18/2014   CLINICAL DATA:  Chest pain and short of breath for 3 weeks.  EXAM: CHEST  2 VIEW  COMPARISON:  None.  FINDINGS: Upper normal heart size. Clear lungs. Linear atelectasis in the lingula. Lungs otherwise clear. No pleural effusion or pneumothorax.  IMPRESSION: No active cardiopulmonary disease.   Electronically Signed   By: Jolaine Click M.D.   On: 10/18/2014 16:03   Nm Myocar Multi W/spect W/wall Motion / Ef  10/18/2014   CLINICAL DATA:  Chest pain on exertion. Shortness of breath for 2-3 weeks.  EXAM: MYOCARDIAL IMAGING WITH SPECT (REST AND EXERCISE)  GATED LEFT VENTRICULAR WALL MOTION STUDY  LEFT VENTRICULAR EJECTION FRACTION  TECHNIQUE: Standard myocardial SPECT imaging was performed after resting intravenous injection of 10 mCi Tc-47m sestamibi. Subsequently, exercise tolerance test was performed by the patient under the supervision of the Cardiology staff. At peak-stress, 30 mCi Tc-75m sestamibi was injected intravenously and standard myocardial SPECT imaging was performed. Quantitative gated imaging was also performed to evaluate left ventricular wall motion, and estimate left ventricular ejection fraction.  COMPARISON:  None.  FINDINGS: Perfusion: There is mild reversibility along the inferolateral wall near the apex and mid segment. There is a fixed defect along the base of the inferior wall.  Wall Motion: Diffuse mild hypokinesia.  Left Ventricular Ejection Fraction: 44 %  End diastolic volume 124 ml  End systolic volume 69 ml  IMPRESSION: 1. Mild reversibility along the inferolateral wall near the apex and mid segment. Findings are concerning for stress-induced ischemia.  2. Mild diffuse hypokinesia.  No focal wall abnormality.  3. Left ventricular ejection fraction is 44%.  4. Intermediate-risk stress test findings*.  *2012 Appropriate Use Criteria  for Coronary Revascularization Focused Update: J Am Coll Cardiol. 2012;59(9):857-881. http://content.dementiazones.comonlinejacc.org/article.aspx?articleid=1201161  These results will be called to the ordering clinician or representative by the Radiologist Assistant, and communication documented in the PACS or zVision Dashboard.   Electronically Signed   By: Richarda OverlieAdam  Henn M.D.   On: 10/18/2014 13:57     Assessment and Plan  1. Unstable angina/CAD (recent abnormal stress) - LHC 10/20/14 s/p PTCA/DES to prox RCA, FFR of mLAD (found to be not hemodynamically significant), also occluded distal LCx prior to terminal small OM branch with L-L collaterals. Residual dz for med rx - will need 30 day rx for Brilinta and then 90 day for mail order - will drop metoprolol back down to 12.5mg  BID given tendency towards soft BPs 90s-100s  2. Ischemic cardiomyopathy EF 45% - no signs of CHF - salt restriction/fluid intake reviewed - BP a little too soft for ACEI/ARB at present time  3. Hyperglycemia recent A1C 6.1 - counseled regarding lifestyle modification  4. Dyslipidemia  - just started on atorvastatin 20mg  last week - will d/w MD regarding dose recommendation. LDL 100  5. Hypothyroidism - continue levothyroxine  6. Chronic RBBB  MD to advise on when to return to work - works for [employer], goes on occasional flights for business trips.  Signed, Ronie Spiesayna Dunn PA-C Pager: [phone]   I have seen, examined and evaluated the patient this AM along with Mrs. Dunn, PA-C.  After reviewing all the available data and chart,  I agree with her findings, examination as well as impression recommendations.  Stable post PCI of 100% RCA.  Agree with medication adjustments.   Should be OK to return to work by next Monday. OK to travel at that time as well.  Will have ROV arranged on d/c.   Marykay LexHARDING, Lamisha Roussell W, M.D., M.S. Interventional Cardiologist   Pager # [phone]

## 2014-10-21 NOTE — Progress Notes (Signed)
CARDIAC REHAB PHASE I   PRE:  Rate/Rhythm: 79 SR with RBBB    BP: sitting 105/68    SaO2:   MODE:  Ambulation: 1000 ft   POST:  Rate/Rhythm: 92 SR with RBBB    BP: sitting 122/79     SaO2:   Tolerated well, sts he feels great. 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 Discussed ed and CRPII which he is interested in. Will send referral to [place]. Discussed Hgb A1C and ways to control preDM. Very receptive.  [phone]   Elissa LovettReeve, Ha Placeres MeadowdaleKristan CES, ACSM 10/21/2014 9:17 AM

## 2014-10-21 NOTE — Discharge Summary (Signed)
Discharge Summary   Patient ID: Luke Rice MRN: [id number], DOB/AGE: 1961/02/03 54 y.o. Admit date: 10/20/2014 D/C date:     10/21/2014  Primary Care Provider: Tana Conch, MD Primary Cardiologist: Listed as Dr. Patty Sermons on our rounding card, presumably since case was discussed with Dr. Patty Sermons after [name] proctored the stress test. Initially seen by Dr. Ladona Ridgel on day of initial consult but he is not an EP patient. Per d/w Dr. Herbie Baltimore, should probably f/u with Dr. Swaziland as his primary cardiologist.  Primary Discharge Diagnoses:  1. Unstable angina/CAD (recent abnormal stress) - LHC 10/20/14 s/p PTCA/DES to prox RCA, FFR of mLAD (found to be not hemodynamically significant), also occluded distal LCx prior to terminal small OM branch with L-L collaterals. Residual dz for med rx. 2. Ischemic cardiomyopathy EF 45% 3. Hyperglycemia recent A1C 6.1 4. Dyslipidemia  5. Hypothyroidism 6. Chronic RBBB 7. Obesity Body mass index is 37.25 kg/(m^2).  Secondary Discharge Diagnoses:  1.Psoriasis 2. Plantar fasciitis 3. BPH 4. H/o renal cyst  Hospital Course: [name] is a 54 y/o M with history of hypothyroidism and BPH with no prior cardiac history before this admission who presented for planned cath. He was recently seen as outpatient for chest pain concerning for unstable angina, ocurring frequently with exertion. Cath versus stress test was discussed and the patient preferred to start with stress test. This was performed as outpatient, and was electrically positive for ischemia showing 3+ ST depression developed in V3-6 with 3/10 chest pain during the treadmill. [name] was given one SLNTG with resolution of pain and ST depression resolved. Nuc results were abnormal with mild reversibility along the inferolateral wall near the apex and mid segment, findings concerning for stress-induced ischemia, mild diffuse HK, EF 44%. He was started on 12.5 mg BID Lopressor. He had also recently  been started on ASA/Lipitor by his PCP. He was brought in for cardiac cath yesterday showing: Left mainstem: Normal Left anterior descending (LAD): There is an eccentric 50-60% stenosis in the mid LAD. There is a 50% stenosis in the distal LAD  [place] Intermediate: Large vessel with 40% segmental stenosis in the proximal vessel.  Left circumflex (LCx): 30% mid LCx disease. The distal LCx is occluded prior to a terminal small OM branch. There are left to left collaterals to the distal OM.  Right coronary artery (RCA): 100% occlusion proximally. There are left to right collaterals to the RCA.  Left ventriculography: Left ventricular systolic function is abnormal. There is moderate hypokinesis of the basal to mid inferior wall. LVEF is estimated at 45%, there is no significant mitral regurgitation  He ultimately underwent DES to RCA. FFR of the moderate mid LAD stenosis was 0.84 indicating that this lesion is not hemodynamically significant. Medical therapy was recommended for residual CAD in PDA/PLOM/ and distal LCx OM. Statin was titrated to  daily. Blood pressure prohibited further titration of beta blocker, and did not allow for ACEI initiation at this time. We can consider as ouptatient. Patient educated on salt restriction, weights, and fluid restriction. He was also asked to f/u PCP for continued monitoring of hyperglycemia given A1C 6.1 indicating pre-diabetes. He will need f/u lipids/LFTs 6-8 weeks, which should be arranged at follow-up appointment. Dr. Herbie Baltimore has seen and examined the patient today and feels he is stable for discharge. He was advised that he may return to work in 1 week.   He requested rx be sent in to mail order. I gave him a handwritten 30-day rx  for atorvastatin and Brilinta as well to use before he receives this.  Discharge Vitals: Blood pressure 105/68, pulse 80, temperature 98.2 F (36.8 C), temperature source Oral, resp. rate 18, height 5\' 7"  (1.702 m), weight 237  lb 14 oz (107.9 kg), SpO2 95 %.  Labs: Lab Results  Component Value Date   WBC 8.7 10/21/2014   HGB 12.9* 10/21/2014   HCT 39.8 10/21/2014   MCV 86.0 10/21/2014   PLT 268 10/21/2014    Recent Labs Lab 10/21/14 0515  NA 138  K 4.1  CL 107  CO2 22  BUN 11  CREATININE 0.86  CALCIUM 8.9  GLUCOSE 115*   Lab Results  Component Value Date   CHOL 194 09/03/2014   HDL 51 09/03/2014   LDLCALC 100 09/03/2014   TRIG 215* 09/03/2014     Diagnostic Studies/Procedures   Dg Chest 2 View  10/18/2014   CLINICAL DATA:  Chest pain and short of breath for 3 weeks.  EXAM: CHEST  2 VIEW  COMPARISON:  None.  FINDINGS: Upper normal heart size. Clear lungs. Linear atelectasis in the lingula. Lungs otherwise clear. No pleural effusion or pneumothorax.  IMPRESSION: No active cardiopulmonary disease.   Electronically Signed   By: Jolaine ClickArthur  Hoss M.D.   On: 10/18/2014 16:03   Nm Myocar Multi W/spect W/wall Motion / Ef  10/18/2014   CLINICAL DATA:  Chest pain on exertion. Shortness of breath for 2-3 weeks.  EXAM: MYOCARDIAL IMAGING WITH SPECT (REST AND EXERCISE)  GATED LEFT VENTRICULAR WALL MOTION STUDY  LEFT VENTRICULAR EJECTION FRACTION  TECHNIQUE: Standard myocardial SPECT imaging was performed after resting intravenous injection of 10 mCi Tc-2727m sestamibi. Subsequently, exercise tolerance test was performed by the patient under the supervision of the Cardiology staff. At peak-stress, 30 mCi Tc-3727m sestamibi was injected intravenously and standard myocardial SPECT imaging was performed. Quantitative gated imaging was also performed to evaluate left ventricular wall motion, and estimate left ventricular ejection fraction.  COMPARISON:  None.  FINDINGS: Perfusion: There is mild reversibility along the inferolateral wall near the apex and mid segment. There is a fixed defect along the base of the inferior wall.  Wall Motion: Diffuse mild hypokinesia.  Left Ventricular Ejection Fraction: 44 %  End diastolic  volume 161124 ml  End systolic volume 69 ml  IMPRESSION: 1. Mild reversibility along the inferolateral wall near the apex and mid segment. Findings are concerning for stress-induced ischemia.  2. Mild diffuse hypokinesia.  No focal wall abnormality.  3. Left ventricular ejection fraction is 44%.  4. Intermediate-risk stress test findings*.  *2012 Appropriate Use Criteria for Coronary Revascularization Focused Update: J Am Coll Cardiol. 2012;59(9):857-881. http://content.dementiazones.comonlinejacc.org/article.aspx?articleid=1201161  These results will be called to the ordering clinician or representative by the Radiologist Assistant, and communication documented in the PACS or zVision Dashboard.   Electronically Signed   By: Richarda OverlieAdam  Henn M.D.   On: 10/18/2014 13:57   Cardiac catheterization this admission, please see full report and above for summary.  Discharge Medications   Current Discharge Medication List    START taking these medications   Details  ticagrelor (BRILINTA) 90 MG TABS tablet Take 1 tablet (90 mg total) by mouth 2 (two) times daily. Qty: 180 tablet, Refills: 3      CONTINUE these medications which have CHANGED   Details  atorvastatin (LIPITOR) 80 MG tablet Take 1 tablet (80 mg total) by mouth every evening. Qty: 90 tablet, Refills: 1      CONTINUE these medications which have NOT  CHANGED   Details  aspirin 81 MG tablet Take 81 mg by mouth daily.    levothyroxine (SYNTHROID, LEVOTHROID) 25 MCG tablet Take 1 tablet (25 mcg total) by mouth daily before breakfast.     metoprolol tartrate (LOPRESSOR) 25 MG tablet Take 0.5 tablets (12.5 mg total) by mouth 2 (two) times daily.     nitroGLYCERIN (NITROSTAT) 0.4 MG SL tablet Place 1 tablet (0.4 mg total) under the tongue every 5 (five) minutes as needed for chest pain (3 doses max. Call 911 if not relieved.).       STOP taking these medications     ibuprofen (ADVIL,MOTRIN) 200 MG tablet         Disposition   The patient will be  discharged in stable condition to home. Discharge Instructions    Amb Referral to Cardiac Rehabilitation    Complete by:  As directed      Diet - low sodium heart healthy    Complete by:  As directed      Increase activity slowly    Complete by:  As directed   No driving for 2 days. No lifting over 5 lbs for 1 week. No sexual activity for 1 week. You may return to work in 1 week. Keep procedure site clean & dry. If you notice increased pain, swelling, bleeding or pus, call/return!  You may shower, but no soaking baths/hot tubs/pools for 1 week.   Your cholesterol medicine (atorvastatin) was increased to slow the progression of further heart blockages.  Patients taking aspirin and Brilinta should generally stay away from medicines like ibuprofen, Advil, Motrin, naproxen, and Aleve due to risk of stomach bleeding. You may take Tylenol as directed or talk to your primary doctor about alternatives.  One of your heart tests showed weakness of the heart muscle this admission. This may make you more susceptible to weight gain from fluid retention, which can lead to symptoms that we call heart failure.  For patients with congestive heart failure, we give them these special instructions:  1. Follow a low-salt diet and watch your fluid intake. In general, you should not be taking in more than 2 liters of fluid per day (no more than 8 glasses per day). Some patients are restricted to less than 1.5 liters of fluid per day (no more than 6 glasses per day). This includes sources of water in foods like soup, coffee, tea, milk, etc. 2. Weigh yourself on the same scale at same time of day and keep a log. 3. Call your doctor: (Anytime you feel any of the following symptoms)  - 3-4 pound weight gain in 1-2 days or 2 pounds overnight  - Shortness of breath, with or without a dry hacking cough  - Swelling in the hands, feet or stomach  - If you have to sleep on extra pillows at night in order to breathe  IT IS  IMPORTANT TO LET YOUR DOCTOR KNOW EARLY ON IF YOU ARE HAVING SYMPTOMS SO WE CAN HELP YOU!          Follow-up Information    Follow up with THOMPSON, KATHRYN R, PA-C.   Specialty:  Cardiology   Why:  [facility] - 11/06/14 at 10am   Contact information:   [address] [place] [address] [phone]         Duration of Discharge Encounter: Greater than 30 minutes including physician and PA time.  Signed, Ronie Spies PA-C 10/21/2014, 10:55 AM  I have seen,  examined and evaluated the patient this AM along with Mrs. Dunn, PA-C. After reviewing all the available data and chart, I agree with her findings, examination as well as impression recommendations.  Stable post PCI of 100% RCA. Agree with medication adjustments.  Should be OK to return to work by next Monday. OK to travel at that time as well.  Will have ROV arranged on d/c.   Marykay Lex, M.D., M.S. Interventional Cardiologist   Pager # [phone]

## 2014-10-22 ENCOUNTER — Encounter: Payer: Self-pay | Admitting: Internal Medicine

## 2014-10-22 ENCOUNTER — Encounter: Payer: Self-pay | Admitting: Family Medicine

## 2014-10-24 ENCOUNTER — Other Ambulatory Visit: Payer: Self-pay | Admitting: *Deleted

## 2014-10-24 ENCOUNTER — Other Ambulatory Visit: Payer: Self-pay

## 2014-10-24 MED ORDER — METOPROLOL TARTRATE 25 MG PO TABS: 12.5000 mg | ORAL_TABLET | Freq: Two times a day (BID) | ORAL | Status: DC

## 2014-10-24 MED ORDER — ATORVASTATIN CALCIUM 80 MG PO TABS: 80.0000 mg | ORAL_TABLET | Freq: Every evening | ORAL | Status: DC

## 2014-10-24 NOTE — Telephone Encounter (Signed)
Rx(s) sent to pharmacy electronically.  

## 2014-10-27 ENCOUNTER — Other Ambulatory Visit: Payer: Self-pay

## 2014-10-27 ENCOUNTER — Telehealth: Payer: Self-pay | Admitting: Physician Assistant

## 2014-10-27 MED ORDER — TICAGRELOR 90 MG PO TABS: 90.0000 mg | ORAL_TABLET | Freq: Two times a day (BID) | ORAL | Status: DC

## 2014-10-27 MED ORDER — ATORVASTATIN CALCIUM 80 MG PO TABS: 80.0000 mg | ORAL_TABLET | Freq: Every evening | ORAL | Status: DC

## 2014-10-27 NOTE — Telephone Encounter (Signed)
Looks like an error where went to Cardinal HealthDayna Dunn, GeorgiaPA

## 2014-10-27 NOTE — Telephone Encounter (Signed)
Luke Rice,  I received following msg in my inbox saying there was an error e-scribing this medicine to Luke Rice's mail order.  Medication Detail      Disp Refills Start End     ticagrelor (BRILINTA) 90 MG TABS tablet (Discontinued) 180 tablet 3 10/21/2014 10/27/2014    Sig - Route: Take 1 tablet (90 mg total) by mouth 2 (two) times daily. - Oral    Reason for Discontinue: Reorder    E-Prescribing Status: Transmission to pharmacy failed (10/21/2014 11:32 AM EDT)    However, when I look to see in the patient's active meds and orders it says this was ordered on 10/27/14. It looks like Greta Doomose Jacobs entered this under Dr. SwazilandJordan. Is there anything else you can see from your end that I need to do on this patient? I do not usually get these messages and just want to make sure it's taken care of.  Thank you, Dayna

## 2014-10-31 ENCOUNTER — Encounter: Payer: Self-pay | Admitting: Family Medicine

## 2014-11-05 NOTE — Progress Notes (Signed)
Cardiology Office Note   Date:  11/06/2014   ID:  Luke Rice, DOB 1961-05-29, MRN [id number]  PCP:  Tana Conch, MD  Cardiologist:  Dr. Swaziland   Post hospital follow up- CAD    History of Present Illness: Luke Rice is a 54 y.o. male with a history of CAD s/p PTCA/DES to prox RCA (09/2014), ischemic cardiomyopathy EF 45, HLD, hypothyroidism, chronic RBBB, morbid obesity and psoriasis who presents for post hospital follow up. Discharged on 10/21/14  He was recently seen as outpatient on 10/17/14 for chest pain concerning for unstable angina, ocurring frequently with exertion. Cath versus stress test was discussed and the patient preferred to start with stress test. This was performed as outpatient. Nuc results were abnormal with mild reversibility along the inferolateral wall near the apex and mid segment, findings concerning for stress-induced ischemia, mild diffuse HK, EF 44%. He was started on 12.5 mg BID Lopressor. He had also recently been started on ASA/Lipitor by his PCP. He was brought in for cardiac cath 10/20/14 which revealed: Left mainstem: Normal Left anterior descending (LAD): There is an eccentric 50-60% stenosis in the mid LAD. There is a 50% stenosis in the distal LAD  [place] Intermediate: Large vessel with 40% segmental stenosis in the proximal vessel.  Left circumflex (LCx): 30% mid LCx disease. The distal LCx is occluded prior to a terminal small OM branch. There are left to left collaterals to the distal OM.  Right coronary artery (RCA): 100% occlusion proximally. There are left to right collaterals to the RCA.  Left ventriculography: Left ventricular systolic function is abnormal. There is moderate hypokinesis of the basal to mid inferior wall. LVEF is estimated at 45%, there is no significant mitral regurgitation  He ultimately underwent DES to RCA. FFR of the moderate mid LAD stenosis was 0.84 indicating that this lesion was not hemodynamically significant.  Medical therapy was recommended for residual CAD in PDA/PLOM/ and distal LCx OM. Statin was titrated to  daily. Blood pressure prohibited further titration of beta blocker, and did not allow for ACEI initiation at that time.   Today he presents for post hospital follow up. He is doing quite well. He has no CP or SOB. No orthopnea, PND or LE edema. No blood in his stool or urine. Taking all of his medications. He does have some mild dizziness before bed but not syncope. He starts cardiac rehab today.   Past Medical History  Diagnosis Date  . Psoriasis   . Plantar fasciitis of left foot   . BPH (benign prostatic hyperplasia)     [place] both[place]ome  . Obesity   . Renal cyst 01/22/2014    17mm left kidney. Bozniak Class I in discussion with [facility]. No further follow up.    . Coronary artery disease     a.  LHC 10/20/14 s/p PTCA/DES to prox RCA, FFR of mLAD (found to be not hemodynamically significant), also occluded distal LCx prior to terminal small OM branch with L-L collaterals. Residual dz for med rx.  . Hypothyroidism   . Ischemic cardiomyopathy     a. EF 45% by cath 09/2014.  Marland Kitchen Hyperglycemia     a. A1C 6.1 in 09/2014.  Marland Kitchen Dyslipidemia   . RBBB     Past Surgical History  Procedure Laterality Date  . Colonoscopy      30s. concern for diverticulitis.   . Coronary angioplasty with stent placement  10/20/2014    "1"  . Left heart catheterization with coronary angiogram N/A 10/20/2014    Procedure: LEFT HEART CATHETERIZATION WITH CORONARY ANGIOGRAM;  Surgeon: Peter M SwazilandJordan, MD;  [facility];  Service: Cardiovascular;  Laterality: N/A;  . Cardiac catheterization N/A 10/20/2014    Procedure: CORONARY STENT INTERVENTION;  Surgeon: Peter M SwazilandJordan, MD;  [facility];  Service: Cardiovascular;  Laterality: N/A;  . Fractional flow reserve wire  10/20/2014    Procedure: FRACTIONAL FLOW RESERVE WIRE;   Surgeon: Peter M SwazilandJordan, MD;  [facility];  Service: Cardiovascular;;     Current Outpatient Prescriptions  Medication Sig Dispense Refill  . aspirin 81 MG tablet Take 81 mg by mouth daily.    Marland Kitchen. atorvastatin (LIPITOR) 80 MG tablet Take 1 tablet (80 mg total) by mouth every evening. 90 tablet 1  . levothyroxine (SYNTHROID, LEVOTHROID) 25 MCG tablet Take 1 tablet (25 mcg total) by mouth daily before breakfast. 90 tablet 1  . metoprolol tartrate (LOPRESSOR) 25 MG tablet Take 0.5 tablets (12.5 mg total) by mouth 2 (two) times daily. 90 tablet 1  . nitroGLYCERIN (NITROSTAT) 0.4 MG SL tablet Place 1 tablet (0.4 mg total) under the tongue every 5 (five) minutes as needed for chest pain (3 doses max. Call 911 if not relieved.). 30 tablet 1  . ticagrelor (BRILINTA) 90 MG TABS tablet Take 1 tablet (90 mg total) by mouth 2 (two) times daily. 180 tablet 2   No current facility-administered medications for this visit.    Allergies:   Codeine    Social History:  The patient  reports that he has never smoked. He has never used smokeless tobacco. He reports that he drinks about 2.4 oz of alcohol per week. He reports that he does not use illicit drugs.   Family History:  The patient's family history includes Bipolar disorder in his brother; Diabetes in his brother; Hypothyroidism in his mother and sister; Lung cancer in an other family member. There is no history of Colon cancer, Rectal cancer, or Stomach cancer.    ROS:  Please see the history of present illness.   Otherwise, review of systems are positive for none.   All other systems are reviewed and negative.    PHYSICAL EXAM: VS:  BP 115/60 mmHg  Pulse 59  Ht 5\' 7"  (1.702 m)  Wt 228 lb (103.42 kg)  BMI 35.70 kg/m2  SpO2 97% , BMI Body mass index is 35.7 kg/(m^2). GEN: Well nourished, well developed, in no acute distress. Obese HEENT: normal Neck: no JVD, carotid bruits, or masses Cardiac: RRR; no murmurs, rubs, or gallops,no edema   Respiratory:  clear to auscultation bilaterally, normal work of breathing GI: soft, nontender, nondistended, + BS MS: no deformity or atrophy Skin: warm and dry, no rash Neuro:  Strength and sensation are intact Psych: euthymic mood, full affect   EKG:  EKG is not ordered today.    Recent Labs: 09/03/2014: ALT 30; TSH 4.82 10/21/2014: BUN 11; Creatinine 0.86; Hemoglobin 12.9*; Platelets 268; Potassium 4.1; Sodium 138    Lipid Panel    Component Value Date/Time   CHOL 194 09/03/2014   TRIG 215* 09/03/2014   HDL 51 09/03/2014   CHOLHDL 5 01/22/2014 1011   VLDL 26.6 01/22/2014 1011   LDLCALC 100 09/03/2014      Wt Readings from Last 3 Encounters:  11/06/14 228 lb (103.42 kg)  10/21/14 237 lb 14 oz (107.9 kg)  10/17/14 234 lb 9.6  oz (106.414 kg)      Other studies Reviewed: Additional studies/ records that were reviewed today include: [facility] Review of the above records demonstrates:  [facility]DES to prox RCA, FFR of mLAD (found to be not hemodynamically significant), also occluded distal LCx prior to terminal small OM branch with L-L collaterals. Residual dz for med rx.   ASSESSMENT AND PLAN:  Luke Rice is a 54 y.o. male with a history of CAD s/p PTCA/DES to prox RCA (09/2014), ischemic cardiomyopathy EF 45, HLD, hypothyroidism, chronic RBBB, morbid obesity and psoriasis who presents for post hospital follow up  CAD-  s/p PTCA/DES to prox RCA (09/2014)  -- Continue ASA/plavix, statin and BB. -- He is starting cardiac rehab today.   Ischemic CM- EF 45% -- Appears euvolemic today. Continue BB. His BP is 115/60. Will not add an ACE today.    Hyperglycemia- A1C 6.1 indicating pre-diabetes. Follow up with PCP  HLD- Continue statin. He will need f/u lipids/LFTs 6-8 weeks ( 2nd week in June- we will have this arranged today.)   Current medicines are reviewed at length with the patient today.  The patient does not have concerns regarding medicines.  The  following changes have been made:  no change  Labs/ tests ordered today include:   Orders Placed This Encounter  Procedures  . Lipid Profile  . Hepatic function panel     Disposition:   FU with Dr. SwazilandJordan in 3 months   Signed, Allena KatzHOMPSON, KATHRYN R, PA-C  11/06/2014 10:57 AM    [facility] [address], [place], [place]  [address] Phone: [phone]; Fax: [phone]

## 2014-11-06 ENCOUNTER — Ambulatory Visit (INDEPENDENT_AMBULATORY_CARE_PROVIDER_SITE_OTHER): Payer: BLUE CROSS/BLUE SHIELD | Admitting: Physician Assistant

## 2014-11-06 ENCOUNTER — Encounter: Payer: Self-pay | Admitting: Physician Assistant

## 2014-11-06 ENCOUNTER — Encounter (HOSPITAL_COMMUNITY)
Admission: RE | Admit: 2014-11-06 | Discharge: 2014-11-06 | Disposition: A | Discharge status: Home / Self Care | Payer: BLUE CROSS/BLUE SHIELD | Source: Ambulatory Visit | Attending: Cardiology | Admitting: Cardiology

## 2014-11-06 VITALS — BP 115/60 | HR 59 | Ht 67.0 in | Wt 228.0 lb

## 2014-11-06 DIAGNOSIS — E785 Hyperlipidemia, unspecified: Secondary | ICD-10-CM | POA: Diagnosis not present

## 2014-11-06 DIAGNOSIS — Z48812 Encounter for surgical aftercare following surgery on the circulatory system: Secondary | ICD-10-CM | POA: Insufficient documentation

## 2014-11-06 DIAGNOSIS — Z955 Presence of coronary angioplasty implant and graft: Secondary | ICD-10-CM | POA: Insufficient documentation

## 2014-11-06 NOTE — Patient Instructions (Signed)
Medication Instructions:  Your physician recommends that you continue on your current medications as directed. Please refer to the Current Medication list given to you today.   Labwork: Your physician recommends that you return for lab work on 12/08/14- Lipids and LFTs. Please remember to be fasting.  Testing/Procedures: NONE  Follow-Up: Your physician recommends that you schedule a follow-up appointment in: 3 months with Dr. SwazilandJordan   Any Other Special Instructions Will Be Listed Below (If Applicable).

## 2014-11-12 ENCOUNTER — Encounter (HOSPITAL_COMMUNITY)
Admission: RE | Admit: 2014-11-12 | Discharge: 2014-11-12 | Disposition: A | Discharge status: Home / Self Care | Payer: BLUE CROSS/BLUE SHIELD | Source: Ambulatory Visit | Attending: Cardiology | Admitting: Cardiology

## 2014-11-12 DIAGNOSIS — Z48812 Encounter for surgical aftercare following surgery on the circulatory system: Secondary | ICD-10-CM | POA: Diagnosis not present

## 2014-11-12 DIAGNOSIS — Z955 Presence of coronary angioplasty implant and graft: Secondary | ICD-10-CM | POA: Diagnosis not present

## 2014-11-12 NOTE — Progress Notes (Signed)
Pt in today for his first day of cardiac rehab phase II 1115 exercise class.  Pt tolerated light exercise with no complaints.  Monitor showed sr with neg qrs, bbb.  This is present on his most recent 12 lead ekg  Medication list reconciled.  Pt denies any barriers for compliance. Pt is accompanied by his wife who was also present for orientation.  Pt has returned to work and is using his lunch break in order to participate in CR.  Psychosocial Assessment - PHQ2 score 0.  Pt feels good about his recovery and denies any depression.  Pt has the support of his wife along with coworkers and friends.  Pt demonstrates appropriate and healthy coping skills. 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 No needs identified, no further intervention needed.  Short term goal is to start exercise/habit, better nutrition for pre-diabetes and flexibility. Pt will attend group education class on exercising on your own, Functional fitness, core strengthening  and receive home exercise review by the Exercise Physiologist. Pt also given schedule for the nutritional classes offered on Tuesdays.  Pt and wife plans to attend the group nutrition class. Long term goal is to lose weight to about 195 pounds.  Present weight is 226.6 pounds.  Average weight loss for maintaining the loss is 1-2 pounds a week. Home exercise and nutrition knowledge will be key for success in achieving this goal. Karlene Linemanarlette Giacomo Valone RN, BSN

## 2014-11-14 ENCOUNTER — Encounter (HOSPITAL_COMMUNITY)
Admission: RE | Admit: 2014-11-14 | Discharge: 2014-11-14 | Disposition: A | Discharge status: Home / Self Care | Payer: BLUE CROSS/BLUE SHIELD | Source: Ambulatory Visit | Attending: Cardiology | Admitting: Cardiology

## 2014-11-14 NOTE — Progress Notes (Signed)
Luke Rice 54 y.o. male Nutrition Note Spoke with pt and pt's wife. Nutrition Plan and Nutrition Survey goals reviewed with pt. Pt is following Step 1 of the Therapeutic Lifestyle Changes diet. Pt states decreasing the amount of meat consumed will be difficult for him. Pt currently eats 2 eggs daily. Pt willing to increase egg white consumption and decrease whole eggs consumed.The pt reports cutting out or decreasing carbohydrates since finding out about pre-diabetes dx. Pre-diabetes and carbs discussed. Pt wants to lose wt and states "I haven't lost any wt yet." Pt eats out 10-12 meals/week. Alternatives to eating out discussed. Wt loss tips briefly reviewed. Pt expressed understanding of the information reviewed. Pt aware of nutrition education classes offered and plans on attending nutrition classes.  Lab Results  Component Value Date   HGBA1C 6.1* 09/03/2014    Nutrition Diagnosis ? Food-and nutrition-related knowledge deficit related to lack of exposure to information as related to diagnosis of: ? CVD ? Pre-DM ? Obesity related to excessive energy intake as evidenced by a BMI of 34.8  Nutrition RX/ Estimated Daily Nutrition Needs for: wt loss 1600-2100 Kcal, 45-55 gm fat, 10-14 gm sat fat, 1.6-2.1 gm trans-fat, <1500 mg sodium  Nutrition Intervention ? Pt's individual nutrition plan reviewed with pt. ? Benefits of adopting Therapeutic Lifestyle Changes discussed when Medficts reviewed. ? Pt to attend the Portion Distortion class ? Pt to attend the ? Nutrition I class and ? Nutrition II class  ? Pt given handouts for: ? Nutrition I class ? Nutrition II class  ? Continue client-centered nutrition education by RD, as part of interdisciplinary care. Goal(s) ? Pt to identify and limit food sources of saturated fat, trans fat, and cholesterol ? Pt to identify food quantities necessary to achieve: ? wt loss to a goal wt of 204-222 lb (92.8-101 kg) at graduation from cardiac rehab.  ? Pt to  describe the benefit of including fruits, vegetables, whole grains, and low-fat dairy products in a heart healthy meal plan. Monitor and Evaluate progress toward nutrition goal with team. Nutrition Risk: Change to Moderate Mickle PlumbEdna Patrizia Paule, M.Ed, RD, LDN, CDE 11/14/2014 1:23 PM

## 2014-11-17 ENCOUNTER — Encounter (HOSPITAL_COMMUNITY)
Admission: RE | Admit: 2014-11-17 | Discharge: 2014-11-17 | Disposition: A | Discharge status: Home / Self Care | Payer: BLUE CROSS/BLUE SHIELD | Source: Ambulatory Visit | Attending: Cardiology | Admitting: Cardiology

## 2014-11-17 DIAGNOSIS — Z48812 Encounter for surgical aftercare following surgery on the circulatory system: Secondary | ICD-10-CM | POA: Diagnosis not present

## 2014-11-19 ENCOUNTER — Encounter (HOSPITAL_COMMUNITY)
Admission: RE | Admit: 2014-11-19 | Discharge: 2014-11-19 | Disposition: A | Discharge status: Home / Self Care | Payer: BLUE CROSS/BLUE SHIELD | Source: Ambulatory Visit | Attending: Cardiology | Admitting: Cardiology

## 2014-11-19 DIAGNOSIS — Z48812 Encounter for surgical aftercare following surgery on the circulatory system: Secondary | ICD-10-CM | POA: Diagnosis not present

## 2014-11-19 NOTE — Progress Notes (Signed)
QUALITY OF LIFE SCORE REVIEW Patient score within normal limits. Overall 23.72, Health and Function 23.47, Socioeconomic 23.14, Physical and Spiritual 22.93 and Family 26.40. Scores less than 21 are considered low. No further needs identified.  Will continue to monitor and intervene as necessary. Alanson Alyarlette [place] RN, BSN

## 2014-11-21 ENCOUNTER — Encounter (HOSPITAL_COMMUNITY): Payer: BLUE CROSS/BLUE SHIELD

## 2014-11-26 ENCOUNTER — Encounter (HOSPITAL_COMMUNITY)
Admission: RE | Admit: 2014-11-26 | Discharge: 2014-11-26 | Disposition: A | Discharge status: Home / Self Care | Payer: BLUE CROSS/BLUE SHIELD | Source: Ambulatory Visit | Attending: Cardiology | Admitting: Cardiology

## 2014-11-26 DIAGNOSIS — Z48812 Encounter for surgical aftercare following surgery on the circulatory system: Secondary | ICD-10-CM | POA: Diagnosis not present

## 2014-11-26 DIAGNOSIS — Z955 Presence of coronary angioplasty implant and graft: Secondary | ICD-10-CM | POA: Diagnosis not present

## 2014-11-26 NOTE — Progress Notes (Signed)
Reviewed home exercise with pt today.  Pt plans to walk and use treadmill at home for exercise.  He also has access to circuit training classes at work for exercise.  Reviewed THR, pulse, RPE, sign and symptoms, NTG use, and when to call 911 or MD.  Pt voiced understanding. Luke PierceJessica Kyro Joswick, MA, ACSM RCEP

## 2014-11-28 ENCOUNTER — Encounter (HOSPITAL_COMMUNITY): Payer: BLUE CROSS/BLUE SHIELD

## 2014-12-01 ENCOUNTER — Encounter (HOSPITAL_COMMUNITY): Payer: BLUE CROSS/BLUE SHIELD

## 2014-12-02 ENCOUNTER — Telehealth (HOSPITAL_COMMUNITY): Payer: Self-pay | Admitting: *Deleted

## 2014-12-02 NOTE — Telephone Encounter (Signed)
-----   Message from Peter M SwazilandJordan, MD sent at 12/02/2014 10:08 AM EDT ----- Regarding: RE: Change in Exercise Prescription for Cardiac Rehab I agree- thanks  Peter SwazilandJordan MD, [facility]   ----- Message -----    From: Luke Rice    Sent: 12/02/2014   8:56 AM      To: Peter M SwazilandJordan, MD Subject: Change in Exercise Prescription for Cardiac #  Dr. SwazilandJordan,  Pt is interested in doing high intensity interval training (HIIT) in Cardiac Rehab.  They have been in program for 2 weeks and have been doing great.  We would like to change their exercise prescription to include HIIT.  Their current THR is 85-135 (50-80%) and we would like to increase it to 161 max (95 %) for HIIT.  Their RPE levels for the HIIT would reach up to 16-17 and active rest would be 11-13.  They would start at 2 min of active rest and 30 sec of high intensity and progress as tolerated. If you are agreeable to this change in exercise prescription please let us know.  Thanks so much for your help!  Fabio PierceJessica Tinaya Ceballos, MA, ACSM RCEP

## 2014-12-03 ENCOUNTER — Encounter (HOSPITAL_COMMUNITY): Payer: BLUE CROSS/BLUE SHIELD

## 2014-12-05 ENCOUNTER — Encounter (HOSPITAL_COMMUNITY): Payer: BLUE CROSS/BLUE SHIELD

## 2014-12-08 ENCOUNTER — Other Ambulatory Visit (INDEPENDENT_AMBULATORY_CARE_PROVIDER_SITE_OTHER): Payer: BLUE CROSS/BLUE SHIELD | Admitting: *Deleted

## 2014-12-08 ENCOUNTER — Encounter (HOSPITAL_COMMUNITY)
Admission: RE | Admit: 2014-12-08 | Discharge: 2014-12-08 | Disposition: A | Discharge status: Home / Self Care | Payer: BLUE CROSS/BLUE SHIELD | Source: Ambulatory Visit | Attending: Cardiology | Admitting: Cardiology

## 2014-12-08 DIAGNOSIS — E785 Hyperlipidemia, unspecified: Secondary | ICD-10-CM | POA: Diagnosis not present

## 2014-12-08 DIAGNOSIS — Z48812 Encounter for surgical aftercare following surgery on the circulatory system: Secondary | ICD-10-CM | POA: Diagnosis not present

## 2014-12-08 LAB — HEPATIC FUNCTION PANEL
ALT: 38 U/L (ref 0–53)
AST: 25 U/L (ref 0–37)
Albumin: 4.2 g/dL (ref 3.5–5.2)
Alkaline Phosphatase: 52 U/L (ref 39–117)
Bilirubin, Direct: 0.2 mg/dL (ref 0.0–0.3)
Total Bilirubin: 0.7 mg/dL (ref 0.2–1.2)
Total Protein: 6.9 g/dL (ref 6.0–8.3)

## 2014-12-08 LAB — LIPID PANEL
Cholesterol: 118 mg/dL (ref 0–200)
HDL: 45.4 mg/dL (ref >39.00)
LDL Cholesterol: 48 mg/dL (ref 0–99)
NonHDL: 72.6
Total CHOL/HDL Ratio: 3
Triglycerides: 122 mg/dL (ref 0.0–149.0)
VLDL: 24.4 mg/dL (ref 0.0–40.0)

## 2014-12-08 NOTE — Addendum Note (Signed)
Addended by: Tonita Phoenix on: 12/08/2014 10:20 AM   Modules accepted: Orders

## 2014-12-10 ENCOUNTER — Encounter (HOSPITAL_COMMUNITY)
Admission: RE | Admit: 2014-12-10 | Discharge: 2014-12-10 | Disposition: A | Discharge status: Home / Self Care | Payer: BLUE CROSS/BLUE SHIELD | Source: Ambulatory Visit | Attending: Cardiology | Admitting: Cardiology

## 2014-12-10 DIAGNOSIS — Z48812 Encounter for surgical aftercare following surgery on the circulatory system: Secondary | ICD-10-CM | POA: Diagnosis not present

## 2014-12-12 ENCOUNTER — Encounter (HOSPITAL_COMMUNITY): Payer: BLUE CROSS/BLUE SHIELD

## 2014-12-15 ENCOUNTER — Encounter (HOSPITAL_COMMUNITY)
Admission: RE | Admit: 2014-12-15 | Discharge: 2014-12-15 | Disposition: A | Discharge status: Home / Self Care | Payer: BLUE CROSS/BLUE SHIELD | Source: Ambulatory Visit | Attending: Cardiology | Admitting: Cardiology

## 2014-12-15 DIAGNOSIS — Z48812 Encounter for surgical aftercare following surgery on the circulatory system: Secondary | ICD-10-CM | POA: Diagnosis not present

## 2014-12-17 ENCOUNTER — Encounter (HOSPITAL_COMMUNITY): Payer: BLUE CROSS/BLUE SHIELD

## 2014-12-19 ENCOUNTER — Encounter (HOSPITAL_COMMUNITY)
Admission: RE | Admit: 2014-12-19 | Discharge: 2014-12-19 | Disposition: A | Discharge status: Home / Self Care | Payer: BLUE CROSS/BLUE SHIELD | Source: Ambulatory Visit | Attending: Cardiology | Admitting: Cardiology

## 2014-12-19 NOTE — Progress Notes (Signed)
  30 day Psychosocial followup assessment  Patient psychosocial assessment reveals no barriers to cardiac rehab participation. No needs identified.  No intervention needed.   Pt in today for the education class but elected not to stay for exercise.  Pt asked to speak to Probation officer.  Pt with schedule conflicts due to his work hours and attending cardiac rehab.  Pt presently comes on his lunch break and misses about 3 hours of work every Monday, Wednesday and Fridays.  Pt has attended 7 exercise session since he started back on 11/12/2014.  Pt struggles to meet the demands of his job duties and this creates stress for him Pt offered two times a week to attend and/or change his class time.  Unfortunately his work day starts at 26 and ends at 85. .  Therefore, pt would like to discontinue his participation in cardiac rehab.  Pt plans to return to on Monday for his final day of exercise. Pt wishes to talk extensively to the exercise specialist regarding continued home exercise.  Pt readily admits he has not adopted a consistent exercise program on the days he does not attend CR. Advised pt of the importance of establishing a home exercise routine on a consistent basis.  Presently pt is walking but has not walked for exercise on a consistent basis. 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   However pt has not participated yet.  This is an issue that pt had prior to his cardiac event and has yet figured out how to incorporated exercise in his routine.  Pt strongly encouraged to establish a routine and perhaps had a "buddy" person join him for support and accountability. Repeat PHQ2 score remain 0.  Pt has supportive wife and denies any needs. Pt is making progress toward meeting the short term goal to start exercise habit and bette nutrition.  Pt has started to exercise on his off days but has difficulties maintaining consistency.  Pt met with dietician  to talk about areas for growth.  Long term goal to lose weight  To 195 pound. Not enough time for pt to meet this goal however pt has lost 1 kg.  With encouragement on his exercise routine I believe pt will be successful. Cherre Huger, BSN

## 2014-12-22 ENCOUNTER — Encounter (HOSPITAL_COMMUNITY): Payer: BLUE CROSS/BLUE SHIELD

## 2014-12-22 ENCOUNTER — Encounter: Payer: Self-pay | Admitting: Cardiology

## 2014-12-24 ENCOUNTER — Encounter (HOSPITAL_COMMUNITY): Payer: BLUE CROSS/BLUE SHIELD

## 2014-12-26 ENCOUNTER — Encounter (HOSPITAL_COMMUNITY): Payer: BLUE CROSS/BLUE SHIELD

## 2014-12-31 ENCOUNTER — Encounter (HOSPITAL_COMMUNITY): Payer: BLUE CROSS/BLUE SHIELD

## 2015-01-02 ENCOUNTER — Encounter (HOSPITAL_COMMUNITY): Payer: BLUE CROSS/BLUE SHIELD

## 2015-01-05 ENCOUNTER — Encounter (HOSPITAL_COMMUNITY): Payer: BLUE CROSS/BLUE SHIELD

## 2015-01-07 ENCOUNTER — Encounter (HOSPITAL_COMMUNITY): Payer: BLUE CROSS/BLUE SHIELD

## 2015-01-09 ENCOUNTER — Encounter (HOSPITAL_COMMUNITY): Payer: BLUE CROSS/BLUE SHIELD

## 2015-01-12 ENCOUNTER — Encounter (HOSPITAL_COMMUNITY): Payer: BLUE CROSS/BLUE SHIELD

## 2015-01-14 ENCOUNTER — Encounter (HOSPITAL_COMMUNITY): Payer: BLUE CROSS/BLUE SHIELD

## 2015-01-16 ENCOUNTER — Encounter: Payer: Self-pay | Admitting: Cardiology

## 2015-01-16 ENCOUNTER — Encounter (HOSPITAL_COMMUNITY): Payer: BLUE CROSS/BLUE SHIELD

## 2015-01-19 ENCOUNTER — Encounter (HOSPITAL_COMMUNITY): Payer: BLUE CROSS/BLUE SHIELD

## 2015-01-21 ENCOUNTER — Encounter (HOSPITAL_COMMUNITY): Payer: BLUE CROSS/BLUE SHIELD

## 2015-01-23 ENCOUNTER — Encounter (HOSPITAL_COMMUNITY): Payer: BLUE CROSS/BLUE SHIELD

## 2015-01-26 ENCOUNTER — Encounter (HOSPITAL_COMMUNITY): Payer: BLUE CROSS/BLUE SHIELD

## 2015-01-27 ENCOUNTER — Encounter: Payer: Self-pay | Admitting: Family Medicine

## 2015-01-27 NOTE — Telephone Encounter (Signed)
Luke Rice can you please reach out to patient and inform him about the ID travel clinic? THanks

## 2015-01-28 ENCOUNTER — Encounter (HOSPITAL_COMMUNITY): Payer: BLUE CROSS/BLUE SHIELD

## 2015-01-30 ENCOUNTER — Encounter (HOSPITAL_COMMUNITY): Payer: BLUE CROSS/BLUE SHIELD

## 2015-02-02 ENCOUNTER — Encounter (HOSPITAL_COMMUNITY): Payer: BLUE CROSS/BLUE SHIELD

## 2015-02-04 ENCOUNTER — Encounter (HOSPITAL_COMMUNITY): Payer: BLUE CROSS/BLUE SHIELD

## 2015-02-06 ENCOUNTER — Encounter (HOSPITAL_COMMUNITY): Payer: BLUE CROSS/BLUE SHIELD

## 2015-02-09 ENCOUNTER — Encounter (HOSPITAL_COMMUNITY): Payer: BLUE CROSS/BLUE SHIELD

## 2015-02-10 ENCOUNTER — Encounter: Payer: Self-pay | Admitting: Family Medicine

## 2015-02-10 ENCOUNTER — Ambulatory Visit (INDEPENDENT_AMBULATORY_CARE_PROVIDER_SITE_OTHER): Payer: BLUE CROSS/BLUE SHIELD | Admitting: Family Medicine

## 2015-02-10 VITALS — BP 110/72 | HR 66 | Temp 98.4°F | Wt 235.0 lb

## 2015-02-10 DIAGNOSIS — G4700 Insomnia, unspecified: Secondary | ICD-10-CM

## 2015-02-10 DIAGNOSIS — F411 Generalized anxiety disorder: Secondary | ICD-10-CM

## 2015-02-10 MED ORDER — ALPRAZOLAM 1 MG PO TABS: 0.5000 mg | ORAL_TABLET | Freq: Every evening | ORAL | Status: DC | PRN

## 2015-02-10 NOTE — Assessment & Plan Note (Signed)
Provided Xanax 0.5-1mg  for travel and for sleep while traveling. We discussed risks of benzos and that we would avoid chronic use.

## 2015-02-10 NOTE — Patient Instructions (Addendum)
Medication Instructions:  Same, see list  with addition- xanax as needed for sleep with travel only. Try 1 tab before you leave to make sure you tolerate it.   Labwork: Update thyroid next visit  Follow-Up (all visit scheduling, rescheduling, cancellations including labs should be scheduled at front desk): Schedule an annual physical- late October or later  Get a flu shot September to november

## 2015-02-10 NOTE — Progress Notes (Signed)
Tana Conch, MD  Subjective:  Luke Rice is a 54 y.o. year old very pleasant male patient who presents with:  Also, Anxiety and sleep issues related to international travel -Has had issues with travel in the past. Has long 3 week work trip coming up. Had been given alprazolam while he worked in [place] in the past and still has some. Takes 1/4 to 1/2 of a  tab. Alprazolam states expired 2015.   ROS- no chest pain, shortness of breath, nausea, vomiting  Past Medical History- CAD, ischemic cardiomyopathy, hypothyroidism, HLD  Medications- reviewed and updated Current Outpatient Prescriptions  Medication Sig Dispense Refill  . aspirin 81 MG tablet Take 81 mg by mouth daily.    Marland Kitchen atorvastatin (LIPITOR) 80 MG tablet Take 1 tablet (80 mg total) by mouth every evening. 90 tablet 1  . levothyroxine (SYNTHROID, LEVOTHROID) 25 MCG tablet Take 1 tablet (25 mcg total) by mouth daily before breakfast. 90 tablet 1  . metoprolol tartrate (LOPRESSOR) 25 MG tablet Take 0.5 tablets (12.5 mg total) by mouth 2 (two) times daily. 90 tablet 1  . nitroGLYCERIN (NITROSTAT) 0.4 MG SL tablet Place 1 tablet (0.4 mg total) under the tongue every 5 (five) minutes as needed for chest pain (3 doses max. Call 911 if not relieved.). 30 tablet 1  . ticagrelor (BRILINTA) 90 MG TABS tablet Take 1 tablet (90 mg total) by mouth 2 (two) times daily. 180 tablet 2   Objective: BP 110/72 mmHg  Pulse 66  Temp(Src) 98.4 F (36.9 C)  Wt 235 lb (106.595 kg) Gen: NAD, resting comfortably CV: regular rate Lungs: nonlabored Abdomen: soft/nontender/nondistended/normal bowel sounds. No rebound or guarding. obese Ext: no edema Skin: warm, dry Neuro: grossly normal, moves all extremities Psych: non anxious   Assessment/Plan:  Insomnia Provided Xanax 0.5-1mg  for travel and for sleep while traveling. We discussed risks of benzos and that we would avoid chronic use.     Meds ordered this encounter  Medications  .  ALPRAZolam (XANAX) 1 MG tablet    Sig: Take 0.5-1 tablets (0.5-1 mg total) by mouth at bedtime as needed for sleep (with travel).    Dispense:  30 tablet    Refill:  0   >50% of 15 minute office visit was spent on counseling (benefits/risks of benzos, alternate treatments) and coordination of care

## 2015-02-11 ENCOUNTER — Encounter (HOSPITAL_COMMUNITY): Payer: BLUE CROSS/BLUE SHIELD

## 2015-02-13 ENCOUNTER — Encounter (HOSPITAL_COMMUNITY): Payer: BLUE CROSS/BLUE SHIELD

## 2015-02-16 ENCOUNTER — Encounter (HOSPITAL_COMMUNITY): Payer: BLUE CROSS/BLUE SHIELD

## 2015-02-16 ENCOUNTER — Ambulatory Visit (INDEPENDENT_AMBULATORY_CARE_PROVIDER_SITE_OTHER): Payer: BLUE CROSS/BLUE SHIELD | Admitting: Cardiology

## 2015-02-16 ENCOUNTER — Encounter: Payer: Self-pay | Admitting: Cardiology

## 2015-02-16 VITALS — BP 110/70 | HR 55 | Ht 67.0 in | Wt 233.0 lb

## 2015-02-16 DIAGNOSIS — I4510 Unspecified right bundle-branch block: Secondary | ICD-10-CM | POA: Diagnosis not present

## 2015-02-16 DIAGNOSIS — I2510 Atherosclerotic heart disease of native coronary artery without angina pectoris: Secondary | ICD-10-CM | POA: Diagnosis not present

## 2015-02-16 DIAGNOSIS — E785 Hyperlipidemia, unspecified: Secondary | ICD-10-CM | POA: Diagnosis not present

## 2015-02-16 DIAGNOSIS — Z9861 Coronary angioplasty status: Secondary | ICD-10-CM

## 2015-02-16 DIAGNOSIS — E669 Obesity, unspecified: Secondary | ICD-10-CM

## 2015-02-16 DIAGNOSIS — I255 Ischemic cardiomyopathy: Secondary | ICD-10-CM

## 2015-02-16 MED ORDER — METOPROLOL SUCCINATE ER 25 MG PO TB24: 25.0000 mg | ORAL_TABLET | Freq: Every day | ORAL | Status: DC

## 2015-02-16 NOTE — Progress Notes (Signed)
Cardiology Office Note   Date:  02/16/2015   ID:  Luke Rice, DOB September 02, 1960, MRN [id number]  PCP:  Tana Conch, MD  Cardiologist:  Dr. Swaziland   Post hospital follow up- CAD    History of Present Illness: Luke Rice is a 54 y.o. male with a history of CAD s/p PTCA/DES to prox RCA (09/2014), ischemic cardiomyopathy EF 45, HLD, hypothyroidism, chronic RBBB, morbid obesity and psoriasis who presents for follow up.  He presented with symptoms of angina.  Nuc results were abnormal with mild reversibility along the inferolateral wall near the apex and mid segment, findings concerning for stress-induced ischemia, mild diffuse HK, EF 44%. He was started on 12.5 mg BID Lopressor. He had also recently been started on ASA/Lipitor by his PCP. He was brought in for cardiac cath 10/20/14 which revealed: Left mainstem: Normal Left anterior descending (LAD): There is an eccentric 50-60% stenosis in the mid LAD. There is a 50% stenosis in the distal LAD  [place] Intermediate: Large vessel with 40% segmental stenosis in the proximal vessel.  Left circumflex (LCx): 30% mid LCx disease. The distal LCx is occluded prior to a terminal small OM branch. There are left to left collaterals to the distal OM.  Right coronary artery (RCA): 100% occlusion proximally. There are left to right collaterals to the RCA.  Left ventriculography: Left ventricular systolic function is abnormal. There is moderate hypokinesis of the basal to mid inferior wall. LVEF is estimated at 45%, there is no significant mitral regurgitation  He ultimately underwent DES to RCA. FFR of the moderate mid LAD stenosis was 0.84 indicating that this lesion was not hemodynamically significant. Medical therapy was recommended for residual CAD in PDA/PLOM/ and distal LCx OM. Statin was titrated to  daily.   Today he is doing quite well. He has no CP or SOB. No orthopnea, PND or LE edema. Was able to go Scuba diving in [place].  Taking all  of his medications. He initially had some dizziness but this resolved. He still has a lot of room for improvement in diet an exercise.    Past Medical History  Diagnosis Date  . Psoriasis   . Plantar fasciitis of left foot   . BPH (benign prostatic hyperplasia)     [place], not currently as bothresome  . Obesity   . Renal cyst 01/22/2014    17mm left kidney. Bozniak Class I in discussion with [facility]. No further follow up.    . Coronary artery disease     a.  LHC 10/20/14 s/p PTCA/DES to prox RCA, FFR of mLAD (found to be not hemodynamically significant), also occluded distal LCx prior to terminal small OM branch with L-L collaterals. Residual dz for med rx.  . Hypothyroidism   . Ischemic cardiomyopathy     a. EF 45% by cath 09/2014.  Marland Kitchen Hyperglycemia     a. A1C 6.1 in 09/2014.  Marland Kitchen Dyslipidemia   . RBBB     Past Surgical History  Procedure Laterality Date  . Colonoscopy      30s. concern for diverticulitis.   . Coronary angioplasty with stent placement  10/20/2014    "1"  . Left heart catheterization with coronary angiogram N/A 10/20/2014    Procedure: LEFT HEART CATHETERIZATION WITH CORONARY ANGIOGRAM;  Surgeon: Teela Narducci M Swaziland, MD;  Location: [facility] CATH LAB;  Service: Cardiovascular;  Laterality: N/A;  . Cardiac catheterization N/A 10/20/2014    Procedure: CORONARY STENT  INTERVENTION;  Surgeon: Jeramey Lanuza M Swaz[place], MD;  Location: [facility] CATH LAB;  Service: Cardiovascular;  Laterality: N/A;  . Fractional flow reserve wire  10/20/2014    Procedure: FRACTIONAL FLOW RESERVE WIRE;  Surgeon: Aanchal Cope M Swaziland, MD;  Location: [facility] CATH LAB;  Service: Cardiovascular;;     Current Outpatient Prescriptions  Medication Sig Dispense Refill  . ALPRAZolam (XANAX) 1 MG tablet Take 0.5-1 tablets (0.5-1 mg total) by mouth at bedtime as needed for sleep (with travel). 30 tablet 0  . aspirin 81 MG tablet Take 81 mg by mouth daily.    Marland Kitchen atorvastatin (LIPITOR)  80 MG tablet Take 1 tablet (80 mg total) by mouth every evening. 90 tablet 1  . levothyroxine (SYNTHROID, LEVOTHROID) 25 MCG tablet Take 1 tablet (25 mcg total) by mouth daily before breakfast. 90 tablet 1  . nitroGLYCERIN (NITROSTAT) 0.4 MG SL tablet Place 1 tablet (0.4 mg total) under the tongue every 5 (five) minutes as needed for chest pain (3 doses max. Call 911 if not relieved.). 30 tablet 1  . ticagrelor (BRILINTA) 90 MG TABS tablet Take 1 tablet (90 mg total) by mouth 2 (two) times daily. 180 tablet 2  . metoprolol succinate (TOPROL XL) 25 MG 24 hr tablet Take 1 tablet (25 mg total) by mouth daily. 90 tablet 3   No current facility-administered medications for this visit.    Allergies:   Review of patient's allergies indicates no known allergies.    Social History:  The patient  reports that he has never smoked. He has never used smokeless tobacco. He reports that he drinks about 2.4 oz of alcohol per week. He reports that he does not use illicit drugs.   Family History:  The patient's family history includes Bipolar disorder in his brother; Diabetes in his brother; Hypothyroidism in his mother and sister; Lung cancer in an other family member. There is no history of Colon cancer, Rectal cancer, or Stomach cancer.    ROS:  Please see the history of present illness.   Otherwise, review of systems are positive for none.   All other systems are reviewed and negative.    PHYSICAL EXAM: VS:  BP 110/70 mmHg  Pulse 55  Ht 5\' 7"  (1.702 m)  Wt 105.688 kg (233 lb)  BMI 36.48 kg/m2 , BMI Body mass index is 36.48 kg/(m^2). GEN: Well nourished, well developed, in no acute distress. Obese HEENT: normal Neck: no JVD, carotid bruits, or masses Cardiac: RRR; no murmurs, rubs, or gallops,no edema  Respiratory:  clear to auscultation bilaterally, normal work of breathing GI: soft, nontender, nondistended, + BS MS: no deformity or atrophy Skin: warm and dry, no rash Neuro:  Strength and  sensation are intact Psych: euthymic mood, full affect   EKG:  EKG is not ordered today.    Recent Labs: 09/03/2014: TSH 4.82 10/21/2014: BUN 11; Creatinine, Ser 0.86; Hemoglobin 12.9*; Platelets 268; Potassium 4.1; Sodium 138 12/08/2014: ALT 38    Lipid Panel    Component Value Date/Time   CHOL 118 12/08/2014 1020   TRIG 122.0 12/08/2014 1020   HDL 45.40 12/08/2014 1020   CHOLHDL 3 12/08/2014 1020   VLDL 24.4 12/08/2014 1020   LDLCALC 48 12/08/2014 1020      Wt Readings from Last 3 Encounters:  02/16/15 105.688 kg (233 lb)  02/10/15 106.595 kg (235 lb)  11/06/14 103.42 kg (228 lb)      Other studies Reviewed: Additional studies/ records that were reviewed today include: none Review of  the above records demonstrates:  N/A   ASSESSMENT AND PLAN:   CAD-  s/p PTCA/DES to prox RCA (09/2014)  -- Continue ASA/Brilinta, statin and BB. Switch to Toprol XL 25 mg daily for convenience.  -- Encourage increased aerobic activity.   Ischemic CM- EF 45% -- Appears euvolemic today. Continue BB. His BP is 115/60. Will not add an ACE today.  Will check Echo now. If EF normalized will continue current therapy. If EF is reduced would add ACEi.   Hyperglycemia- A1C 6.1 indicating pre-diabetes. Follow up with PCP  HLD- Continue statin. Lipids well controlled.   Current medicines are reviewed at length with the patient today.  The patient does not have concerns regarding medicines.  The following changes have been made:  no change  Labs/ tests ordered today include:   Orders Placed This Encounter  Procedures  . Basic metabolic panel  . Lipid panel  . Hepatic function panel  . ECHOCARDIOGRAM COMPLETE     Disposition:   FU with Dr. Swaziland in 6 months   Signed, Malic Rosten Swaziland, MD  02/16/2015 1:07 PM

## 2015-02-16 NOTE — Patient Instructions (Signed)
Continue efforts at lifestyle modification  We will switch metoprolol to Toprol XL 25 mg daily  We will schedule you for an Echocardiogram  I will see you in 6 months.

## 2015-02-27 ENCOUNTER — Other Ambulatory Visit: Payer: Self-pay | Admitting: Family Medicine

## 2015-03-09 ENCOUNTER — Ambulatory Visit (HOSPITAL_COMMUNITY): Payer: BLUE CROSS/BLUE SHIELD

## 2015-03-12 ENCOUNTER — Other Ambulatory Visit: Payer: Self-pay

## 2015-03-12 ENCOUNTER — Ambulatory Visit (HOSPITAL_COMMUNITY): Payer: BLUE CROSS/BLUE SHIELD | Attending: Cardiology

## 2015-03-12 DIAGNOSIS — Z9861 Coronary angioplasty status: Secondary | ICD-10-CM

## 2015-03-12 DIAGNOSIS — E669 Obesity, unspecified: Secondary | ICD-10-CM | POA: Diagnosis not present

## 2015-03-12 DIAGNOSIS — I350 Nonrheumatic aortic (valve) stenosis: Secondary | ICD-10-CM | POA: Diagnosis not present

## 2015-03-12 DIAGNOSIS — I2510 Atherosclerotic heart disease of native coronary artery without angina pectoris: Secondary | ICD-10-CM

## 2015-03-12 DIAGNOSIS — E785 Hyperlipidemia, unspecified: Secondary | ICD-10-CM | POA: Diagnosis not present

## 2015-03-12 DIAGNOSIS — Z6836 Body mass index (BMI) 36.0-36.9, adult: Secondary | ICD-10-CM | POA: Insufficient documentation

## 2015-03-12 DIAGNOSIS — I4510 Unspecified right bundle-branch block: Secondary | ICD-10-CM

## 2015-03-12 DIAGNOSIS — I255 Ischemic cardiomyopathy: Secondary | ICD-10-CM

## 2015-03-19 ENCOUNTER — Other Ambulatory Visit: Payer: Self-pay

## 2015-03-19 DIAGNOSIS — Z9861 Coronary angioplasty status: Secondary | ICD-10-CM

## 2015-03-19 DIAGNOSIS — I2510 Atherosclerotic heart disease of native coronary artery without angina pectoris: Secondary | ICD-10-CM

## 2015-03-19 DIAGNOSIS — E785 Hyperlipidemia, unspecified: Secondary | ICD-10-CM

## 2015-04-06 ENCOUNTER — Other Ambulatory Visit: Payer: Self-pay | Admitting: Physician Assistant

## 2015-05-08 ENCOUNTER — Encounter: Payer: Self-pay | Admitting: Cardiology

## 2015-05-08 ENCOUNTER — Encounter: Payer: Self-pay | Admitting: Family Medicine

## 2015-05-09 ENCOUNTER — Encounter: Payer: Self-pay | Admitting: Family Medicine

## 2015-05-11 ENCOUNTER — Other Ambulatory Visit: Payer: Self-pay | Admitting: Family Medicine

## 2015-05-11 DIAGNOSIS — Z0000 Encounter for general adult medical examination without abnormal findings: Secondary | ICD-10-CM

## 2015-05-11 NOTE — Telephone Encounter (Signed)
The labs mentioned should be covered in standard labs Make sure he gets  CBC, CMET, Lipid Panel, TSH at a minimum PSA if he opts for it

## 2015-05-13 ENCOUNTER — Other Ambulatory Visit (INDEPENDENT_AMBULATORY_CARE_PROVIDER_SITE_OTHER): Payer: BLUE CROSS/BLUE SHIELD

## 2015-05-13 DIAGNOSIS — Z0000 Encounter for general adult medical examination without abnormal findings: Secondary | ICD-10-CM | POA: Diagnosis not present

## 2015-05-13 LAB — COMPREHENSIVE METABOLIC PANEL
ALT: 53 U/L (ref 0–53)
AST: 28 U/L (ref 0–37)
Albumin: 4.5 g/dL (ref 3.5–5.2)
Alkaline Phosphatase: 57 U/L (ref 39–117)
BUN: 21 mg/dL (ref 6–23)
CO2: 30 mEq/L (ref 19–32)
Calcium: 10.1 mg/dL (ref 8.4–10.5)
Chloride: 107 mEq/L (ref 96–112)
Creatinine, Ser: 1.11 mg/dL (ref 0.40–1.50)
GFR: 73.82 mL/min (ref >60.00)
Glucose, Bld: 96 mg/dL (ref 70–99)
Potassium: 4.9 mEq/L (ref 3.5–5.1)
Sodium: 144 mEq/L (ref 135–145)
Total Bilirubin: 0.8 mg/dL (ref 0.2–1.2)
Total Protein: 7.2 g/dL (ref 6.0–8.3)

## 2015-05-13 LAB — TSH: TSH: 3.76 u[IU]/mL (ref 0.35–4.50)

## 2015-05-13 LAB — LIPID PANEL
Cholesterol: 121 mg/dL (ref 0–200)
HDL: 44.4 mg/dL (ref >39.00)
LDL Cholesterol: 52 mg/dL (ref 0–99)
NonHDL: 76.42
Total CHOL/HDL Ratio: 3
Triglycerides: 120 mg/dL (ref 0.0–149.0)
VLDL: 24 mg/dL (ref 0.0–40.0)

## 2015-05-13 LAB — CBC
HCT: 45.7 % (ref 39.0–52.0)
Hemoglobin: 15.1 g/dL (ref 13.0–17.0)
MCHC: 33.1 g/dL (ref 30.0–36.0)
MCV: 84.7 fl (ref 78.0–100.0)
Platelets: 293 10*3/uL (ref 150.0–400.0)
RBC: 5.4 Mil/uL (ref 4.22–5.81)
RDW: 14.7 % (ref 11.5–15.5)
WBC: 8.7 10*3/uL (ref 4.0–10.5)

## 2015-05-13 LAB — PSA: PSA: 0.89 ng/mL (ref 0.10–4.00)

## 2015-05-18 ENCOUNTER — Encounter: Payer: Self-pay | Admitting: Family Medicine

## 2015-05-18 ENCOUNTER — Ambulatory Visit (INDEPENDENT_AMBULATORY_CARE_PROVIDER_SITE_OTHER): Payer: BLUE CROSS/BLUE SHIELD | Admitting: Family Medicine

## 2015-05-18 ENCOUNTER — Encounter: Payer: Self-pay | Admitting: Cardiology

## 2015-05-18 VITALS — BP 110/76 | HR 67 | Temp 98.6°F | Ht 67.0 in | Wt 236.0 lb

## 2015-05-18 DIAGNOSIS — Z0000 Encounter for general adult medical examination without abnormal findings: Secondary | ICD-10-CM

## 2015-05-18 DIAGNOSIS — R5383 Other fatigue: Secondary | ICD-10-CM | POA: Diagnosis not present

## 2015-05-18 NOTE — Progress Notes (Signed)
Tana Conch, MD Phone: [phone]  Subjective:  Patient presents today for their annual physical. Chief complaint-noted.   See problem oriented charting ROS- full  review of systems was completed and negative except for as noted in HPI (fatigue, chest discomfort, SOB).   The following were reviewed and entered/updated in epic: Past Medical History  Diagnosis Date  . Psoriasis   . Plantar fasciitis of left foot   . BPH (benign prostatic hyperplasia)     [place], not currently as bothresome  . Obesity   . Renal cyst 01/22/2014    17mm left kidney. Bozniak Class I in discussion with [facility]. No further follow up.    . Coronary artery disease     a.  LHC 10/20/14 s/p PTCA/DES to prox RCA, FFR of mLAD (found to be not hemodynamically significant), also occluded distal LCx prior to terminal small OM branch with L-L collaterals. Residual dz for med rx.  . Hypothyroidism   . Ischemic cardiomyopathy     a. EF 45% by cath 09/2014.  Marland Kitchen Hyperglycemia     a. A1C 6.1 in 09/2014.  Marland Kitchen Dyslipidemia   . RBBB    Patient Active Problem List   Diagnosis Date Noted  . CAD S/P percutaneous coronary angioplasty 10/21/2014    Priority: High  . Ischemic cardiomyopathy     Priority: High  . Insomnia 02/10/2015    Priority: Medium  . Dyslipidemia 10/21/2014    Priority: Medium  . Hypothyroidism     Priority: Medium  . RBBB     Priority: Low  . Obesity 01/22/2014    Priority: Low  . Migraine 01/22/2014    Priority: Low  . Psoriasis     Priority: Low  . Plantar fasciitis of left foot     Priority: Low  . BPH (benign prostatic hyperplasia)     Priority: Low   Past Surgical History  Procedure Laterality Date  . Colonoscopy      30s. concern for diverticulitis.   . Coronary angioplasty with stent placement  10/20/2014    "1"  . Left heart catheterization with coronary angiogram N/A 10/20/2014    Procedure: LEFT HEART  CATHETERIZATION WITH CORONARY ANGIOGRAM;  Surgeon: Peter M Swaziland, MD;  Location: [facility] CATH LAB;  Service: Cardiovascular;  Laterality: N/A;  . Cardiac catheterization N/A 10/20/2014    Procedure: CORONARY STENT INTERVENTION;  Surgeon: Peter M Swaziland, MD;  Location: [facility] CATH LAB;  Service: Cardiovascular;  Laterality: N/A;  . Fractional flow reserve wire  10/20/2014    Procedure: FRACTIONAL FLOW RESERVE WIRE;  Surgeon: Peter M Swaziland, MD;  Location: [facility] CATH LAB;  Service: Cardiovascular;;    Family History  Problem Relation Age of Onset  . Hypothyroidism Mother   . Lung cancer      father and grand father but both smokers. Both deceased due to this.   . Diabetes Brother   . Hypothyroidism Sister   . Bipolar disorder Brother   . Colon cancer Neg Hx   . Rectal cancer Neg Hx   . Stomach cancer Neg Hx     Medications- reviewed and updated Current Outpatient Prescriptions  Medication Sig Dispense Refill  . aspirin 81 MG tablet Take 81 mg by mouth daily.    Marland Kitchen atorvastatin (LIPITOR) 80 MG tablet Take 1 tablet (80 mg total) by mouth every evening. 90 tablet 1  . levothyroxine (SYNTHROID, LEVOTHROID) 25 MCG tablet TAKE 1 TABLET DAILY BEFORE BREAKFAST 90 tablet  0  . metoprolol succinate (TOPROL XL) 25 MG 24 hr tablet Take 1 tablet (25 mg total) by mouth daily. 90 tablet 3  . ticagrelor (BRILINTA) 90 MG TABS tablet Take 1 tablet (90 mg total) by mouth 2 (two) times daily. 180 tablet 2  . ALPRAZolam (XANAX) 1 MG tablet Take 0.5-1 tablets (0.5-1 mg total) by mouth at bedtime as needed for sleep (with travel). (Patient not taking: Reported on 05/18/2015) 30 tablet 0  . nitroGLYCERIN (NITROSTAT) 0.4 MG SL tablet Place 1 tablet (0.4 mg total) under the tongue every 5 (five) minutes as needed for chest pain (3 doses max. Call 911 if not relieved.). (Patient not taking: Reported on 05/18/2015) 30 tablet 1   No current facility-administered medications for this visit.    Allergies-reviewed and updated No  Known Allergies  Social History   Social History  . Marital Status: Married    Spouse Name: N/A  . Number of Children: N/A  . Years of Education: N/A   Social History Main Topics  . Smoking status: Never Smoker   . Smokeless tobacco: Never Used  . Alcohol Use: 2.4 oz/week    3 Standard drinks or equivalent, 1 Cans of beer per week  . Drug Use: No  . Sexual Activity: Yes   Other Topics Concern  . None   Social History Narrative   Married for 26 years in 2015. 2 kids- 20 daughter-[employer] and 17 son.    Hobbies- yardwork, family time, travel-[place]; sports-pro football and college basketball, music.    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 26 years-lots of travel previously lived in [place], [place] (12 years). Just relocated to [facility] 12/2013.     ROS--See HPI   Objective: BP 110/76 mmHg  Pulse 67  Temp(Src) 98.6 F (37 C)  Ht  (1.702 m)  Wt 236 lb (107.049 kg)  BMI 36.95 kg/m2 Gen: NAD, resting comfortably HEENT: Mucous membranes are moist. Oropharynx normal Neck: no thyromegaly CV: 2/6 SEM no murmurs rubs or gallops Lungs: CTAB no crackles, wheeze, rhonchi Abdomen: soft/nontender/nondistended/normal bowel sounds. No rebound or guarding.  Rectal: normal tone, diffusely enlarged prostate, no masses or tenderness Ext: no edema Skin: warm, dry, no rash. Full skin exam performed with no worrisome lesions. Advised subscreen on scalp though- some areas of dryness- watch for AK.  Neuro: grossly normal, moves all extremities, PERRLA  Assessment/Plan:  54 y.o. male presenting for annual physical.  Health Maintenance counseling: 1. Anticipatory guidance: Patient counseled regarding regular dental exams, eye exams (no issues though),  wearing seatbelts.  2. Risk factor reduction:  Advised patient of need for regular exercise and diet rich and fruits and vegetables to reduce risk of heart attack and stroke.  3.  Immunizations/screenings/ancillary studies Health Maintenance Due  Topic Date Due  . INFLUENZA VACCINE - received at work 01/26/2015  4. Prostate cancer screening- low risk based off psa and rectal  Lab Results  Component Value Date   PSA 0.89 05/13/2015   PSA 0.66 09/03/2014   PSA 0.64 01/22/2014  5. Colon cancer screening - 08/04/14 with 10 year follow up 6. Skin cancer screening-  Full skin exam today  CAD s/p stent 2016- compliant with asa, atorv, brilinta. Metoprolol. Having some exertional chest discomfort that he has a hard time describing. Also SOB with this. Relieved by rest. Only took nitro one time with this but symptoms already seemed to be improving. Cath this year but will get  back in with cardiology for their opinion- could be deconditioning Hypothyroidism- TSH controlled Hyperlipidemia- LDL <70 as desired Wt Readings from Last 3 Encounters:  05/18/15 236 lb (107.049 kg)  02/16/15 233 lb (105.688 kg)  02/10/15 235 lb (106.595 kg)  weight watchers planned  Fatigue 10 years- osa vs testosterone  6 month check in Return precautions advised.   Orders Placed This Encounter  Procedures  . Testosterone, Free, Total, SHBG  . Ambulatory referral to Sleep Studies    Referral Priority:  Routine    Referral Type:  Consultation    Referral Reason:  Specialty Services Required    Number of Visits Requested:  1

## 2015-05-18 NOTE — Telephone Encounter (Signed)
Spoke to patient.Dr.Jordan received your email.He advised if having anginal symptoms needs to be seen.Stated he is having chest discomfort similar to pain he had before stents.Stated he saw PCP this morning, appointment already scheduled with Dr.Jordan 05/20/15 at 9:00 am.Advised to go to ER if needed.

## 2015-05-18 NOTE — Addendum Note (Signed)
Addended by: Shelva MajesticHUNTER, STEPHEN O on: 05/18/2015 01:47 PM   Modules accepted: Orders

## 2015-05-18 NOTE — Patient Instructions (Addendum)
See cardiology this week. Please avoid strenuous activity until you see them.   Labs look great overall  With fatigue 1. We will call you within a week about your referral to sleep medicine. If you do not hear within 2 weeks, give us a call.  2. Also return for fasting labs between 8-10 Am to check on testosterone. Anytime in next few weeks  Consider 6 month follow up to check in on weight and fatigue especially if workup unrevealing. Also couldbe due to slight valve issue in heart but I doubt- suspect this is deconditioning related

## 2015-05-20 ENCOUNTER — Ambulatory Visit (INDEPENDENT_AMBULATORY_CARE_PROVIDER_SITE_OTHER): Payer: BLUE CROSS/BLUE SHIELD | Admitting: Cardiology

## 2015-05-20 ENCOUNTER — Encounter: Payer: Self-pay | Admitting: Cardiology

## 2015-05-20 ENCOUNTER — Other Ambulatory Visit (INDEPENDENT_AMBULATORY_CARE_PROVIDER_SITE_OTHER): Payer: BLUE CROSS/BLUE SHIELD

## 2015-05-20 VITALS — BP 110/84 | HR 56 | Ht 71.0 in | Wt 235.5 lb

## 2015-05-20 DIAGNOSIS — E785 Hyperlipidemia, unspecified: Secondary | ICD-10-CM

## 2015-05-20 DIAGNOSIS — Z9861 Coronary angioplasty status: Secondary | ICD-10-CM | POA: Diagnosis not present

## 2015-05-20 DIAGNOSIS — R5383 Other fatigue: Secondary | ICD-10-CM

## 2015-05-20 DIAGNOSIS — I4510 Unspecified right bundle-branch block: Secondary | ICD-10-CM | POA: Diagnosis not present

## 2015-05-20 DIAGNOSIS — I2510 Atherosclerotic heart disease of native coronary artery without angina pectoris: Secondary | ICD-10-CM | POA: Diagnosis not present

## 2015-05-20 MED ORDER — ISOSORBIDE MONONITRATE ER 30 MG PO TB24: 30.0000 mg | ORAL_TABLET | Freq: Every day | ORAL | Status: DC

## 2015-05-20 NOTE — Progress Notes (Signed)
Cardiology Office Note   Date:  05/20/2015   ID:  Luke Biblelbert Papin, DOB 02/26/1961, MRN [id number]  PCP:  Tana ConchStephen Hunter, MD  Cardiologist:  Dr. SwazilandJordan   Post hospital follow up- CAD    History of Present Illness: Luke Rice is a 54 y.o. male with a history of CAD s/p PTCA/DES to prox RCA (09/2014), ischemic cardiomyopathy EF 45, HLD, hypothyroidism, chronic RBBB, morbid obesity and psoriasis who presents for follow up.  He presented with symptoms of angina.  Nuc results were abnormal with  reversibility along the inferolateral wall near the apex and mid segment, findings concerning for stress-induced ischemia, mild diffuse HK, EF 44%.  Cardiac cath 10/20/14 which revealed: Left mainstem: Normal Left anterior descending (LAD): There is an eccentric 50-60% stenosis in the mid LAD. There is a 50% stenosis in the distal LAD  [place] Intermediate: Large vessel with 40% segmental stenosis in the proximal vessel.  Left circumflex (LCx): 30% mid LCx disease. The distal LCx is occluded prior to a terminal small OM branch. There are left to left collaterals to the distal OM.  Right coronary artery (RCA): 100% occlusion proximally. There are left to right collaterals to the RCA.  Left ventriculography: Left ventricular systolic function is abnormal. There is moderate hypokinesis of the basal to mid inferior wall. LVEF is estimated at 45%, there is no significant mitral regurgitation  He ultimately underwent DES to RCA. FFR of the moderate mid LAD stenosis was 0.84 indicating that this lesion was not hemodynamically significant. Medical therapy was recommended for residual CAD in PDA/PLOM/ and distal LCx OM. Statin was titrated to 80mg  daily.   On follow up today he reports a 2 week history of some mid chest pain when he initially starts to exert. He rests briefly and then is able to proceed. He states it feels 'uncomfortable" and he has some mild SOB. Repeat Echo in September showed normalization  of EF.    Past Medical History  Diagnosis Date  . Psoriasis   . Plantar fasciitis of left foot   . BPH (benign prostatic hyperplasia)     [place], not currently as bothresome  . Obesity   . Renal cyst 01/22/2014    17mm left kidney. Bozniak Class [facility]. No further follow up.    . Coronary artery disease     a.  LHC 10/20/14 s/p PTCA/DES to prox RCA, FFR of mLAD (found to be not hemodynamically significant), also occluded distal LCx prior to terminal small OM branch with L-L collaterals. Residual dz for med rx.  . Hypothyroidism   . Ischemic cardiomyopathy     a. EF 45% by cath 09/2014.  Marland Kitchen. Hyperglycemia     a. A1C 6.1 in 09/2014.  Marland Kitchen. Dyslipidemia   . RBBB     Past Surgical History  Procedure Laterality Date  . Colonoscopy      30s. concern for diverticulitis.   . Coronary angioplasty with stent placement  10/20/2014    "1"  . Left heart catheterization with coronary angiogram N/A 10/20/2014    Procedure: LEFT HEART CATHETERIZATION WITH CORONARY ANGIOGRAM;  Surgeon: Elizbeth Posa M SwazilandJordan, MD;  [facility];  Service: Cardiovascular;  Laterality: N/A;  . Cardiac catheterization N/A 10/20/2014    Procedure: CORONARY STENT INTERVENTION;  Surgeon: Kallin Henk M SwazilandJordan, MD;  [facility];  Service: Cardiovascular;  Laterality: N/A;  . Fractional flow reserve wire  10/20/2014  Procedure: FRACTIONAL FLOW RESERVE WIRE;  Surgeon: Geraldina Parrott M Swaz[place], MD;  Location: [facility] LAB;  Service: Cardiovascular;;     Current Outpatient Prescriptions  Medication Sig Dispense Refill  . ALPRAZolam (XANAX) 1 MG tablet Take 0.5-1 tablets (0.5-1 mg total) by mouth at bedtime as needed for sleep (with travel). 30 tablet 0  . aspirin 81 MG tablet Take 81 mg by mouth daily.    Marland Kitchen atorvastatin (LIPITOR) 80 MG tablet Take 1 tablet (80 mg total) by mouth every evening. 90 tablet 1  . isosorbide mononitrate (IMDUR) 30 MG 24 hr  tablet Take 1 tablet (30 mg total) by mouth daily. 90 tablet 3  . isosorbide mononitrate (IMDUR) 30 MG 24 hr tablet Take 1 tablet (30 mg total) by mouth daily. 30 tablet 0  . levothyroxine (SYNTHROID, LEVOTHROID) 25 MCG tablet TAKE 1 TABLET DAILY BEFORE BREAKFAST 90 tablet 0  . metoprolol succinate (TOPROL XL) 25 MG 24 hr tablet Take 1 tablet (25 mg total) by mouth daily. 90 tablet 3  . nitroGLYCERIN (NITROSTAT) 0.4 MG SL tablet Place 1 tablet (0.4 mg total) under the tongue every 5 (five) minutes as needed for chest pain (3 doses max. Call 911 if not relieved.). (Patient not taking: Reported on 05/18/2015) 30 tablet 1  . ticagrelor (BRILINTA) 90 MG TABS tablet Take 1 tablet (90 mg total) by mouth 2 (two) times daily. 180 tablet 2   No current facility-administered medications for this visit.    Allergies:   Review of patient's allergies indicates no known allergies.    Social History:  The patient  reports that he has never smoked. He has never used smokeless tobacco. He reports that he drinks about 2.4 oz of alcohol per week. He reports that he does not use illicit drugs.   Family History:  The patient's family history includes Bipolar disorder in his brother; Diabetes in his brother; Hypothyroidism in his mother and sister; Lung cancer in an other family member. There is no history of Colon cancer, Rectal cancer, or Stomach cancer.    ROS:  Please see the history of present illness.   Otherwise, review of systems are positive for none.   All other systems are reviewed and negative.    PHYSICAL EXAM: VS:  BP 110/84 mmHg  Pulse 56  Ht  (1.803 m)  Wt 106.822 kg (235 lb 8 oz)  BMI 32.86 kg/m2 , BMI Body mass index is 32.86 kg/(m^2). GEN: Well nourished, well developed, in no acute distress. Obese HEENT: normal Neck: no JVD, carotid bruits, or masses Cardiac: RRR; no murmurs, rubs, or gallops,no edema  Respiratory:  clear to auscultation bilaterally, normal work of breathing GI:  soft, nontender, nondistended, + BS MS: no deformity or atrophy Skin: warm and dry, no rash Neuro:  Strength and sensation are intact Psych: euthymic mood, full affect   EKG:  EKG is ordered today. NSR. RBBB. Otherwise normal. I have personally reviewed and interpreted this study.     Recent Labs: 05/13/2015: ALT 53; BUN 21; Creatinine, Ser 1.11; Hemoglobin 15.1; Platelets 293.0; Potassium 4.9; Sodium 144; TSH 3.76    Lipid Panel    Component Value Date/Time   CHOL 121 05/13/2015 0836   TRIG 120.0 05/13/2015 0836   HDL 44.40 05/13/2015 0836   CHOLHDL 3 05/13/2015 0836   VLDL 24.0 05/13/2015 0836   LDLCALC 52 05/13/2015 0836     Lab Results  Component Value Date   WBC 8.7 05/13/2015   HGB 15.1 05/13/2015  HCT 45.7 05/13/2015   PLT 293.0 05/13/2015   GLUCOSE 96 05/13/2015   CHOL 121 05/13/2015   TRIG 120.0 05/13/2015   HDL 44.40 05/13/2015   LDLCALC 52 05/13/2015   ALT 53 05/13/2015   AST 28 05/13/2015   NA 144 05/13/2015   K 4.9 05/13/2015   CL 107 05/13/2015   CREATININE 1.11 05/13/2015   BUN 21 05/13/2015   CO2 30 05/13/2015   TSH 3.76 05/13/2015   PSA 0.89 05/13/2015   INR 1.01 10/18/2014   HGBA1C 6.1* 09/03/2014     Wt Readings from Last 3 Encounters:  05/20/15 106.822 kg (235 lb 8 oz)  05/18/15 107.049 kg (236 lb)  02/16/15 105.688 kg (233 lb)      Other studies Reviewed: Additional studies/ records that were reviewed today include: Echo: 03/12/15: Review of the above records demonstrates:  Study Conclusions  - Left ventricle: The cavity size was normal. Systolic function was normal. The estimated ejection fraction was in the range of 60% to 65%. Wall motion was normal; there were no regional wall motion abnormalities. Doppler parameters are consistent with abnormal left ventricular relaxation (grade 1 diastolic dysfunction). - Aortic valve: Valve mobility was restricted. There was mild stenosis. Peak velocity (S): 297 cm/s. Mean  gradient (S): 18 mm Hg.    ASSESSMENT AND PLAN:   1. CAD-  s/p PTCA/DES to prox RCA (09/2014)  -- Continue ASA/Brilinta for one year, statin and BB. Symptoms consistent with some angina. Will add Imdur 30 mg daily. Arrange a follow up stress test to assess degree of residual ischemia.  -- Encourage increased aerobic activity.   2. Ischemic CM- EF now normal.    3. Hyperglycemia- A1C 6.1 indicating pre-diabetes. Follow up with PCP  4. HLD- Continue statin. Lipids well controlled.   Current medicines are reviewed at length with the patient today.  The patient does not have concerns regarding medicines.  The following changes have been made:  no change  Labs/ tests ordered today include:   Orders Placed This Encounter  Procedures  . Myocardial Perfusion Imaging  . EKG 12-Lead     Disposition:   FU with Dr. Swaziland in 6 months   Signed, Pasquale Matters Swaziland, MD  05/20/2015 1:10 PM

## 2015-05-20 NOTE — Patient Instructions (Signed)
We will schedule you for a stress test  Start isosorbide 30 mg daily  Continue your other therapy

## 2015-05-25 ENCOUNTER — Telehealth: Payer: Self-pay | Admitting: Cardiology

## 2015-05-25 LAB — TESTOSTERONE, FREE, TOTAL, SHBG
Sex Hormone Binding: 29 nmol/L (ref 10–50)
Testosterone, Free: 50.7 pg/mL (ref 47.0–244.0)
Testosterone-% Free: 2.1 % (ref 1.6–2.9)
Testosterone: 244 ng/dL — ABNORMAL LOW (ref 300–890)

## 2015-05-25 NOTE — Telephone Encounter (Signed)
Returned call to patient no answer.Left message on personal voice mail Dr.Jordan's recommendations Advised to call back if needed.

## 2015-05-25 NOTE — Telephone Encounter (Signed)
Please call,having real bad side effects from Isosorbide.

## 2015-05-25 NOTE — Telephone Encounter (Signed)
Returned call to patient.He stated he started on 11/24 taking Isosorbide 30 mg daily.Stated he has had a bad headache every day.Message sent to Dr.Jordan for advice.

## 2015-05-25 NOTE — Telephone Encounter (Signed)
Would hold for 24 hours and then try to resume at 15 mg daily. The HA will usually subside within 48 hours if he stays on it but if HA is severe may need to stop.  Peter SwazilandJordan MD, [facility]

## 2015-05-28 ENCOUNTER — Other Ambulatory Visit: Payer: Self-pay | Admitting: Family Medicine

## 2015-05-28 NOTE — Telephone Encounter (Signed)
Spoke to patient he stated he is tolerating Isosorbide 15 mg daily.Stated he has had a slight headache but thinks he will be able to take.

## 2015-06-03 ENCOUNTER — Telehealth (HOSPITAL_COMMUNITY): Payer: Self-pay

## 2015-06-03 NOTE — Telephone Encounter (Signed)
Encounter complete. 

## 2015-06-05 ENCOUNTER — Ambulatory Visit (HOSPITAL_COMMUNITY)
Admission: RE | Admit: 2015-06-05 | Discharge: 2015-06-05 | Disposition: A | Discharge status: Home / Self Care | Payer: BLUE CROSS/BLUE SHIELD | Source: Ambulatory Visit | Attending: Cardiology | Admitting: Cardiology

## 2015-06-05 DIAGNOSIS — R079 Chest pain, unspecified: Secondary | ICD-10-CM | POA: Insufficient documentation

## 2015-06-05 DIAGNOSIS — Z9861 Coronary angioplasty status: Secondary | ICD-10-CM | POA: Diagnosis not present

## 2015-06-05 DIAGNOSIS — R0602 Shortness of breath: Secondary | ICD-10-CM | POA: Insufficient documentation

## 2015-06-05 DIAGNOSIS — R5383 Other fatigue: Secondary | ICD-10-CM | POA: Insufficient documentation

## 2015-06-05 DIAGNOSIS — I4510 Unspecified right bundle-branch block: Secondary | ICD-10-CM | POA: Insufficient documentation

## 2015-06-05 DIAGNOSIS — Z6832 Body mass index (BMI) 32.0-32.9, adult: Secondary | ICD-10-CM | POA: Diagnosis not present

## 2015-06-05 DIAGNOSIS — E785 Hyperlipidemia, unspecified: Secondary | ICD-10-CM | POA: Diagnosis not present

## 2015-06-05 DIAGNOSIS — R9439 Abnormal result of other cardiovascular function study: Secondary | ICD-10-CM | POA: Insufficient documentation

## 2015-06-05 DIAGNOSIS — E669 Obesity, unspecified: Secondary | ICD-10-CM | POA: Insufficient documentation

## 2015-06-05 DIAGNOSIS — I2510 Atherosclerotic heart disease of native coronary artery without angina pectoris: Secondary | ICD-10-CM

## 2015-06-05 DIAGNOSIS — R0609 Other forms of dyspnea: Secondary | ICD-10-CM | POA: Diagnosis not present

## 2015-06-05 LAB — MYOCARDIAL PERFUSION IMAGING
Estimated workload: 10.8 METS
Exercise duration (min): 9 min
Exercise duration (sec): 30 s
LV dias vol: 113 mL
LV sys vol: 45 mL
MPHR: 168 {beats}/min
Peak HR: 160 {beats}/min
Percent HR: 95 %
RPE: 17
Rest HR: 80 {beats}/min
SDS: 10
SRS: 1
SSS: 11
TID: 0.91

## 2015-06-05 MED ORDER — TECHNETIUM TC 99M SESTAMIBI GENERIC - CARDIOLITE
10.8000 | Freq: Once | INTRAVENOUS | Status: AC | PRN
  Administered 2015-06-05: 11 via INTRAVENOUS

## 2015-06-05 MED ORDER — TECHNETIUM TC 99M SESTAMIBI GENERIC - CARDIOLITE
31.0000 | Freq: Once | INTRAVENOUS | Status: AC | PRN
  Administered 2015-06-05: 31 via INTRAVENOUS

## 2015-06-08 ENCOUNTER — Encounter: Payer: Self-pay | Admitting: Cardiology

## 2015-06-17 ENCOUNTER — Encounter: Payer: Self-pay | Admitting: Family Medicine

## 2015-06-23 ENCOUNTER — Telehealth: Payer: Self-pay | Admitting: Family Medicine

## 2015-06-23 ENCOUNTER — Encounter: Payer: Self-pay | Admitting: Family Medicine

## 2015-06-23 NOTE — Telephone Encounter (Signed)
Patient Name: Luke Rice DOB: Nov 07, 1960 Initial Comment caller states he recev'd some oxycodone from the ER - he wants some instruction re this Nurse Assessment Nurse: Yetta BarreJones, RN, Miranda Date/Time (Eastern Time): 06/23/2015 10:01:52 AM Confirm and document reason for call. If symptomatic, describe symptoms. ---Caller states he has a cracked rib and seen in ED on Saturday morning. He was prescribed oxycodone 5/325mg  take 1 every 6 hrs. He is having uncontrolled pain but taking 2 pills every 6 hours. Has the patient traveled out of the country within the last 30 days? ---Not Applicable Does the patient have any new or worsening symptoms? ---Yes Will a triage be completed? ---Yes Related visit to physician within the last 2 weeks? ---Yes Does the PT have any chronic conditions? (i.e. diabetes, asthma, etc.) ---Yes List chronic conditions. ---Heart disease Is this a behavioral health or substance abuse call? ---No Guidelines Guideline Title Affirmed Question Affirmed Notes Chest Injury [1] After 72 hours AND [2] chest pain not improving Final Disposition User See PCP When Office is Open (within 3 days) Yetta BarreJones, RN, [employer] Comments Caller has an appt already scheduled for tomorrow at 9am with Duane Lopeorey Nafziger, NP Told caller that he should only take the prescription medication as it is prescribed. He would like the provider to evaluate the medication prescribed for pain control. Referrals REFERRED TO PCP OFFICE Disagree/Comply:

## 2015-06-23 NOTE — Telephone Encounter (Signed)
Noted  

## 2015-06-24 ENCOUNTER — Ambulatory Visit (INDEPENDENT_AMBULATORY_CARE_PROVIDER_SITE_OTHER): Payer: BLUE CROSS/BLUE SHIELD | Admitting: Adult Health

## 2015-06-24 ENCOUNTER — Other Ambulatory Visit: Payer: Self-pay | Admitting: Cardiology

## 2015-06-24 ENCOUNTER — Encounter: Payer: Self-pay | Admitting: Adult Health

## 2015-06-24 VITALS — BP 102/78 | Temp 98.3°F | Ht 71.0 in | Wt 238.5 lb

## 2015-06-24 DIAGNOSIS — S2232XA Fracture of one rib, left side, initial encounter for closed fracture: Secondary | ICD-10-CM | POA: Diagnosis not present

## 2015-06-24 MED ORDER — OXYCODONE-ACETAMINOPHEN 10-325 MG PO TABS: 1.0000 | ORAL_TABLET | Freq: Three times a day (TID) | ORAL | Status: DC | PRN

## 2015-06-24 NOTE — Progress Notes (Signed)
Subjective:    Patient ID: Luke Rice, male    DOB: [birth year], 54 y.o.   MRN: [id number]  HPI  54 year old male who presents to the office today for follow up after [facility] s/p fall resulting in broken rib ( I do not have these records). He fell on a set of stairs falling on his left side. He was given Percocet 5/325 and has been having to take an extra dose every 4-6 hours to adequately control the pain. He is also taking ibuprofen.   He endorses using his incentive spirometer as directed. Is starting to feel a little bit of improvement.   He denies any fevers, SOB, or congestion.  Review of Systems  Constitutional: Positive for activity change.  HENT: Negative.   Eyes: Negative.   Respiratory: Positive for cough. Negative for wheezing.   Musculoskeletal:       Left flank pain  Skin: Negative.   Neurological: Negative.   Psychiatric/Behavioral: Negative.   All other systems reviewed and are negative.  Past Medical History  Diagnosis Date  . Psoriasis   . Plantar fasciitis of left foot   . BPH (benign prostatic hyperplasia)     describes treatment when lived out of country, not currently as bothresome  . Obesity   . Renal cyst 01/22/2014    17mm left kidney. Bozniak Class I in [facility] with [facility]. No further follow up.    . Coronary artery disease     a.  LHC 10/20/14 s/p PTCA/DES to prox RCA, FFR of mLAD (found to be not hemodynamically significant), also occluded distal LCx prior to terminal small OM branch with L-L collaterals. Residual dz for med rx.  . Hypothyroidism   . Ischemic cardiomyopathy     a. EF 45% by cath 09/2014.  Marland Kitchen. Hyperglycemia     a. A1C 6.1 in 09/2014.  Marland Kitchen. Dyslipidemia   . RBBB     Social History   Social History  . Marital Status: Married    Spouse Name: N/A  . Number of Children: N/A  . Years of Education: N/A   Occupational History  . Not on file.   Social History Main  Topics  . Smoking status: Never Smoker   . Smokeless tobacco: Never Used  . Alcohol Use: 2.4 oz/week    3 Standard drinks or equivalent, 1 Cans of beer per week  . Drug Use: No  . Sexual Activity: Yes   Other Topics Concern  . Not on file   Social History Narrative   Married for 26 years in 2015. 2 kids- 20 daughter-[employer] and [id number] son.    Hobbies- yardwork, family time, travel-[place]; sports-pro football and college basketball, music.    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 26 years-lots of travel previously lived in [place] ([place] years[place]     Past Surgical History  Procedure Laterality Date  . Colonoscopy      30s. concern for diverticulitis.   . Coronary angioplasty with stent placement  10/20/2014    "1"  . Left heart catheterization with coronary angiogram N/A 10/20/2014    Procedure: LEFT HEART CATHETERIZATION WITH CORONARY ANGIOGRAM;  Surgeon: Peter M SwazilandJordan, MD;  [facility];  Service: Cardiovascular;  Laterality: N/A;  . Cardiac catheterization N/A 10/20/2014    Procedure: CORONARY STENT INTERVENTION;  Surgeon: Peter M SwazilandJordan, MD;  Location:  [facility] CATH LAB;  Service: Cardiovascular;  Laterality: N/A;  . Fractional flow reserve wire  10/20/2014    Procedure: FRACTIONAL FLOW RESERVE WIRE;  Surgeon: Peter M Swaziland, MD;  Location: [facility] CATH LAB;  Service: Cardiovascular;;    Family History  Problem Relation Age of Onset  . Hypothyroidism Mother   . Lung cancer      father and grand father but both smokers. Both deceased due to this.   . Diabetes Brother   . Hypothyroidism Sister   . Bipolar disorder Brother   . Colon cancer Neg Hx   . Rectal cancer Neg Hx   . Stomach cancer Neg Hx     No Known Allergies  Current Outpatient Prescriptions on File Prior to Visit  Medication Sig Dispense Refill  . ALPRAZolam (XANAX) 1 MG tablet Take 0.5-1 tablets (0.5-1 mg total) by mouth at  bedtime as needed for sleep (with travel). 30 tablet 0  . aspirin 81 MG tablet Take 81 mg by mouth daily.    Marland Kitchen atorvastatin (LIPITOR) 80 MG tablet Take 1 tablet (80 mg total) by mouth every evening. 90 tablet 1  . isosorbide mononitrate (IMDUR) 30 MG 24 hr tablet Take 1 tablet (30 mg total) by mouth daily. 90 tablet 3  . isosorbide mononitrate (IMDUR) 30 MG 24 hr tablet Take 1 tablet (30 mg total) by mouth daily. 30 tablet 0  . levothyroxine (SYNTHROID, LEVOTHROID) 25 MCG tablet TAKE 1 TABLET DAILY BEFORE BREAKFAST 90 tablet 2  . metoprolol succinate (TOPROL XL) 25 MG 24 hr tablet Take 1 tablet (25 mg total) by mouth daily. 90 tablet 3  . nitroGLYCERIN (NITROSTAT) 0.4 MG SL tablet Place 1 tablet (0.4 mg total) under the tongue every 5 (five) minutes as needed for chest pain (3 doses max. Call 911 if not relieved.). (Patient not taking: Reported on 05/18/2015) 30 tablet 1  . ticagrelor (BRILINTA) 90 MG TABS tablet Take 1 tablet (90 mg total) by mouth 2 (two) times daily. 180 tablet 2   No current facility-administered medications on file prior to visit.    BP 102/78 mmHg  Temp(Src) 98.3 F (36.8 C) (Oral)  Ht  (1.803 m)  Wt 238 lb 8 oz (108.183 kg)  BMI 33.28 kg/m2       Objective:   Physical Exam  Constitutional: He is oriented to person, place, and time. He appears well-developed and well-nourished. No distress.  Cardiovascular: Normal rate, regular rhythm, normal heart sounds and intact distal pulses.  Exam reveals no gallop and no friction rub.   No murmur heard. Pulmonary/Chest: Breath sounds normal. No respiratory distress. He has no wheezes. He has no rales.  Neurological: He is alert and oriented to person, place, and time.  Skin: Skin is warm and dry. No rash noted. He is not diaphoretic. No erythema.  No bruising noted  Psychiatric: He has a normal mood and affect. His behavior is normal. Judgment and thought content normal.  Nursing note and vitals reviewed.       Assessment & Plan:  1. Broken rib, left, closed, initial encounter - Continue with incentive spirometer. Only take new prescription for Percocet for break through pain and do not take it any more often then every 8 hours.  - oxyCODONE-acetaminophen (PERCOCET) 10-325 MG tablet; Take 1 tablet by mouth every 8 (eight) hours as needed for pain.  Dispense: 20 tablet; Refill: 0 - Follow up with fever, sob, productive cough or feeling acutely ill.

## 2015-06-24 NOTE — Telephone Encounter (Signed)
REFILL 

## 2015-06-24 NOTE — Progress Notes (Signed)
Pre visit review using our clinic review tool, if applicable. No additional management support is needed unless otherwise documented below in the visit note. 

## 2015-07-02 ENCOUNTER — Encounter: Payer: Self-pay | Admitting: Adult Health

## 2015-08-21 ENCOUNTER — Ambulatory Visit: Payer: BLUE CROSS/BLUE SHIELD | Admitting: Cardiology

## 2015-08-21 ENCOUNTER — Encounter: Payer: Self-pay | Admitting: *Deleted

## 2015-08-25 ENCOUNTER — Encounter: Payer: Self-pay | Admitting: Cardiology

## 2015-08-28 ENCOUNTER — Other Ambulatory Visit: Payer: Self-pay | Admitting: Family Medicine

## 2015-08-31 ENCOUNTER — Other Ambulatory Visit: Payer: Self-pay

## 2015-08-31 MED ORDER — ALPRAZOLAM 1 MG PO TABS: 0.5000 mg | ORAL_TABLET | Freq: Every evening | ORAL | Status: DC | PRN

## 2015-09-04 ENCOUNTER — Institutional Professional Consult (permissible substitution): Payer: BLUE CROSS/BLUE SHIELD | Admitting: Pulmonary Disease

## 2015-10-09 ENCOUNTER — Encounter: Payer: Self-pay | Admitting: Family Medicine

## 2015-10-19 ENCOUNTER — Encounter: Payer: Self-pay | Admitting: Adult Health

## 2015-10-21 ENCOUNTER — Other Ambulatory Visit: Payer: Self-pay

## 2015-10-21 ENCOUNTER — Other Ambulatory Visit: Payer: Self-pay | Admitting: Physician Assistant

## 2015-10-21 DIAGNOSIS — Z23 Encounter for immunization: Secondary | ICD-10-CM

## 2015-10-21 NOTE — Telephone Encounter (Signed)
Rx(s) sent to pharmacy electronically.  

## 2015-10-27 ENCOUNTER — Encounter: Payer: Self-pay | Admitting: Cardiology

## 2015-10-27 ENCOUNTER — Ambulatory Visit (INDEPENDENT_AMBULATORY_CARE_PROVIDER_SITE_OTHER): Payer: BLUE CROSS/BLUE SHIELD | Admitting: Cardiology

## 2015-10-27 VITALS — BP 112/69 | HR 64 | Ht 67.0 in | Wt 218.4 lb

## 2015-10-27 DIAGNOSIS — I2510 Atherosclerotic heart disease of native coronary artery without angina pectoris: Secondary | ICD-10-CM | POA: Diagnosis not present

## 2015-10-27 DIAGNOSIS — I4510 Unspecified right bundle-branch block: Secondary | ICD-10-CM

## 2015-10-27 DIAGNOSIS — E785 Hyperlipidemia, unspecified: Secondary | ICD-10-CM

## 2015-10-27 DIAGNOSIS — Z9861 Coronary angioplasty status: Secondary | ICD-10-CM

## 2015-10-27 DIAGNOSIS — E669 Obesity, unspecified: Secondary | ICD-10-CM

## 2015-10-27 NOTE — Progress Notes (Signed)
Cardiology Office Note   Date:  10/27/2015   ID:  Luke Rice, DOB September 22, 1960, MRN [id number]  PCP:  Tana Conch, MD  Cardiologist:  Dr. Swaziland   History of Present Illness: Luke Rice is a 55 y.o. male with a history of CAD s/p PTCA/DES to prox RCA (09/2014), ischemic cardiomyopathy EF 45, HLD, hypothyroidism, chronic RBBB, morbid obesity and psoriasis who presents for follow up.  He presented with symptoms of angina.  Nuc results were abnormal with  reversibility along the inferolateral wall near the apex and mid segment, findings concerning for stress-induced ischemia, mild diffuse HK, EF 44%.  Cardiac cath 10/20/14 which revealed: 100% occlusion of the proximal RCA with collaterals. The distal LCx was also occluded. There was a 50-60% mid LAD stenosis with normal FFR. He  underwent DES to RCA.  Medical therapy was recommended for residual CAD in PDA/PLOM/ and distal LCx OM. He had a follow up stress Myoview in Dec 2016 which showed a small area of ischemia in the basal inferior wall c/w LCx occlusion. Perfusion was markedly improved from prior study. No ischemia in LAD territory. EF returned to normal on follow up Echo and Myoview.  On follow up today he is feeling very well. No chest pain or SOB. Walks his dog regularly. Likes to [employer]. No bleeding. Still working on his diet with Weight watchers.    Past Medical History  Diagnosis Date  . Psoriasis   . Plantar fasciitis of left foot   . BPH (benign prostatic hyperplasia)     [place], not currently as bothresome  . Obesity   . Renal cyst 01/22/2014    17mm left kidney. Bozniak Class I in discussion with [facility]. No further follow up.    . Coronary artery disease     a.  LHC 10/20/14 s/p PTCA/DES to prox RCA, FFR of mLAD (found to be not hemodynamically significant), also occluded distal LCx prior to terminal small OM branch with L-L collaterals. Residual dz for med  rx.  . Hypothyroidism   . Ischemic cardiomyopathy     a. EF 45% by cath 09/2014.  Marland Kitchen Hyperglycemia     a. A1C 6.1 in 09/2014.  Marland Kitchen Dyslipidemia   . RBBB     Past Surgical History  Procedure Laterality Date  . Colonoscopy      30s. concern for diverticulitis.   . Coronary angioplasty with stent placement  10/20/2014    "1"  . Left heart catheterization with coronary angiogram N/A 10/20/2014    Procedure: LEFT HEART CATHETERIZATION WITH CORONARY ANGIOGRAM;  Surgeon: Leafy Motsinger M Swaziland, MD;  Location: [facility] CATH LAB;  Service: Cardiovascular;  Laterality: N/A;  . Cardiac catheterization N/A 10/20/2014    Procedure: CORONARY STENT INTERVENTION;  Surgeon: Laureen Frederic M Sw[place], MD;  Location: [facility] CATH LAB;  Service: Cardiovascular;  Laterality: N/A;  . Fractional flow reserve wire  10/20/2014    Procedure: FRACTIONAL FLOW RESERVE WIRE;  Surgeon: Jagger Beahm M [place], MD;  Location: [facility] CATH LAB;  Service: Cardiovascular;;     Current Outpatient Prescriptions  Medication Sig Dispense Refill  . ALPRAZolam (XANAX) 1 MG tablet Take 0.5-1 tablets (0.5-1 mg total) by mouth at bedtime as needed for sleep (with travel). 30 tablet 3  . aspirin 81 MG tablet Take 81 mg by mouth daily.    Marland Kitchen atorvastatin (LIPITOR) 80 MG tablet Take 1 tablet (80 mg total) by mouth every evening. 90 tablet 1  .  levothyroxine (SYNTHROID, LEVOTHROID) 25 MCG tablet TAKE 1 TABLET DAILY BEFORE BREAKFAST 90 tablet 2  . metoprolol succinate (TOPROL XL) 25 MG 24 hr tablet Take 1 tablet (25 mg total) by mouth daily. 90 tablet 3   No current facility-administered medications for this visit.    Allergies:   Review of patient's allergies indicates no known allergies.    Social History:  The patient  reports that he has never smoked. He has never used smokeless tobacco. He reports that he drinks about 2.4 oz of alcohol per week. He reports that he does not use illicit drugs.   Family History:  The patient's family history includes Bipolar disorder in his  brother; Diabetes in his brother; Hypothyroidism in his mother and sister. There is no history of Colon cancer, Rectal cancer, or Stomach cancer.    ROS:  Please see the history of present illness.   Otherwise, review of systems are positive for none.   All other systems are reviewed and negative.    PHYSICAL EXAM: VS:  BP 112/69 mmHg  Pulse 64  Ht 5\' 7"  (1.702 m)  Wt 99.066 kg (218 lb 6.4 oz)  BMI 34.20 kg/m2 , BMI Body mass index is 34.2 kg/(m^2). GEN: Well nourished, well developed, in no acute distress. Obese HEENT: normal Neck: no JVD, carotid bruits, or masses Cardiac: RRR; no murmurs, rubs, or gallops,no edema  Respiratory:  clear to auscultation bilaterally, normal work of breathing GI: soft, nontender, nondistended, + BS MS: no deformity or atrophy Skin: warm and dry, no rash Neuro:  Strength and sensation are intact Psych: euthymic mood, full affect   EKG:  EKG is not ordered today.     Recent Labs: 05/13/2015: ALT 53; BUN 21; Creatinine, Ser 1.11; Hemoglobin 15.1; Platelets 293.0; Potassium 4.9; Sodium 144; TSH 3.76    Lipid Panel    Component Value Date/Time   CHOL 121 05/13/2015 0836   TRIG 120.0 05/13/2015 0836   HDL 44.40 05/13/2015 0836   CHOLHDL 3 05/13/2015 0836   VLDL 24.0 05/13/2015 0836   LDLCALC 52 05/13/2015 0836     Lab Results  Component Value Date   WBC 8.7 05/13/2015   HGB 15.1 05/13/2015   HCT 45.7 05/13/2015   PLT 293.0 05/13/2015   GLUCOSE 96 05/13/2015   CHOL 121 05/13/2015   TRIG 120.0 05/13/2015   HDL 44.40 05/13/2015   LDLCALC 52 05/13/2015   ALT 53 05/13/2015   AST 28 05/13/2015   NA 144 05/13/2015   K 4.9 05/13/2015   CL 107 05/13/2015   CREATININE 1.11 05/13/2015   BUN 21 05/13/2015   CO2 30 05/13/2015   TSH 3.76 05/13/2015   PSA 0.89 05/13/2015   INR 1.01 10/18/2014   HGBA1C 6.1* 09/03/2014     Wt Readings from Last 3 Encounters:  10/27/15 99.066 kg (218 lb 6.4 oz)  06/24/15 108.183 kg (238 lb 8 oz)    06/05/15 106.595 kg (235 lb)      Other studies Reviewed: Additional studies/ records that were reviewed today include: none    ASSESSMENT AND PLAN:   1. CAD-  s/p PTCA/DES to prox RCA (09/2014) Myoview in Dec 2016 as noted above. -- Continue ASA, statin and BB. May stop Brilinta at this time.  -- Encourage increased aerobic activity.   2. Ischemic CM- EF has recovered.   3. HLD- Continue statin. Lipids well controlled.   Current medicines are reviewed at length with the patient today.  The patient does not  have concerns regarding medicines.  The following changes have been made:  Stop Bilinta  Labs/ tests ordered today include:   No orders of the defined types were placed in this encounter.     Disposition:   FU with Dr. SwazilandJordan in 6 months   Signed, Blenda Wisecup SwazilandJordan, MD  10/27/2015 12:31 PM

## 2015-10-27 NOTE — Patient Instructions (Signed)
Stop taking Brilinta  Continue your other therapy  I will see you in 6 months.   

## 2016-02-22 ENCOUNTER — Other Ambulatory Visit: Payer: Self-pay | Admitting: Family Medicine

## 2016-03-23 ENCOUNTER — Other Ambulatory Visit: Payer: Self-pay | Admitting: Cardiology

## 2016-05-17 ENCOUNTER — Other Ambulatory Visit: Payer: Self-pay | Admitting: Cardiology

## 2016-05-18 ENCOUNTER — Ambulatory Visit (INDEPENDENT_AMBULATORY_CARE_PROVIDER_SITE_OTHER): Payer: Managed Care, Other (non HMO) | Admitting: Family Medicine

## 2016-05-18 ENCOUNTER — Other Ambulatory Visit: Payer: Self-pay | Admitting: Emergency Medicine

## 2016-05-18 ENCOUNTER — Encounter: Payer: Self-pay | Admitting: Family Medicine

## 2016-05-18 VITALS — BP 132/88 | HR 82 | Temp 98.6°F | Ht 67.0 in | Wt 225.2 lb

## 2016-05-18 DIAGNOSIS — Z23 Encounter for immunization: Secondary | ICD-10-CM | POA: Diagnosis not present

## 2016-05-18 DIAGNOSIS — Z0000 Encounter for general adult medical examination without abnormal findings: Secondary | ICD-10-CM

## 2016-05-18 LAB — TSH: TSH: 4.82 u[IU]/mL — ABNORMAL HIGH (ref 0.35–4.50)

## 2016-05-18 LAB — COMPREHENSIVE METABOLIC PANEL
ALT: 39 U/L (ref 0–53)
AST: 22 U/L (ref 0–37)
Albumin: 4.3 g/dL (ref 3.5–5.2)
Alkaline Phosphatase: 50 U/L (ref 39–117)
BUN: 14 mg/dL (ref 6–23)
CO2: 30 mEq/L (ref 19–32)
Calcium: 9.5 mg/dL (ref 8.4–10.5)
Chloride: 103 mEq/L (ref 96–112)
Creatinine, Ser: 1.03 mg/dL (ref 0.40–1.50)
GFR: 80.16 mL/min (ref >60.00)
Glucose, Bld: 100 mg/dL — ABNORMAL HIGH (ref 70–99)
Potassium: 4.7 mEq/L (ref 3.5–5.1)
Sodium: 140 mEq/L (ref 135–145)
Total Bilirubin: 0.9 mg/dL (ref 0.2–1.2)
Total Protein: 6.8 g/dL (ref 6.0–8.3)

## 2016-05-18 LAB — LIPID PANEL
Cholesterol: 157 mg/dL (ref 0–200)
HDL: 49.4 mg/dL (ref >39.00)
LDL Cholesterol: 77 mg/dL (ref 0–99)
NonHDL: 107.61
Total CHOL/HDL Ratio: 3
Triglycerides: 151 mg/dL — ABNORMAL HIGH (ref 0.0–149.0)
VLDL: 30.2 mg/dL (ref 0.0–40.0)

## 2016-05-18 LAB — CBC
HCT: 45.9 % (ref 39.0–52.0)
Hemoglobin: 15.3 g/dL (ref 13.0–17.0)
MCHC: 33.2 g/dL (ref 30.0–36.0)
MCV: 85 fl (ref 78.0–100.0)
Platelets: 280 10*3/uL (ref 150.0–400.0)
RBC: 5.4 Mil/uL (ref 4.22–5.81)
RDW: 14.3 % (ref 11.5–15.5)
WBC: 6.7 10*3/uL (ref 4.0–10.5)

## 2016-05-18 LAB — PSA: PSA: 0.78 ng/mL (ref 0.10–4.00)

## 2016-05-18 MED ORDER — LEVOTHYROXINE SODIUM 25 MCG PO TABS: 25.0000 ug | ORAL_TABLET | Freq: Every day | ORAL | 1 refills | Status: DC

## 2016-05-18 MED ORDER — ATORVASTATIN CALCIUM 80 MG PO TABS: 80.0000 mg | ORAL_TABLET | Freq: Every evening | ORAL | 3 refills | Status: DC

## 2016-05-18 MED ORDER — METOPROLOL SUCCINATE ER 25 MG PO TB24: 25.0000 mg | ORAL_TABLET | Freq: Every day | ORAL | 1 refills | Status: DC

## 2016-05-18 NOTE — Progress Notes (Signed)
Phone: [phone]  Subjective:  Patient presents today for their annual physical. [name] Kitchen975 [id number] PSwazilandye Rice.   See problem [name] Kitchen8145 We41PSwaz2 ow up.     Patient Active Problem List   Diagnosis Date Noted  . CAD S/P percutaneous coronary angioplasty 10/21/2014    Priority: High  . Insomnia 02/10/2015    Priority: Medium  . Dyslipidemia 10/21/2014    Priority: Medium  . Hypothyroidism     Priority: Medium  . RBBB     Priority: Low  . Obesity 01/22/2014    Priority: Low  . Migraine 01/22/2014    Priority: Low  . Psoriasis     Priority: Low  . Plantar fasciitis of left foot     Priority: Low  . BPH (benign prostatic hyperplasia)     Priority: Low   Past Surgical History:  Procedure Laterality Date  . CARDIAC CATHETERIZATION N/A 10/20/2014   Procedure: CORONARY STENT INTERVENTION;  Surgeon: Luke M Jordan, MD;  Location: [facility] CATH LAB;  Service: Cardiovascular;  Laterality: N/A;  . COLONOSCOPY     30s. concern for diverticulitis.   .  CORONARY ANGIOPLASTY WITH STENT PLACEMENT  10/20/2014   "1"  . FRACTIONAL FLOW RESERVE WIRE  10/20/2014   Procedure: FRACTIONAL FLOW RESERVE WIRE;  Surgeon: Luke M Jordan, MD;  Location: [facility] CATH LAB;  Service: Cardiovascular;;  . LEFT HEART CATHETERIZATION WITH CORONARY ANGIOGRAM N/A 10/20/2014   Procedure: LEFT HEART CATHETERIZATION WITH CORONARY ANGIOGRAM;  Surgeon: Luke M Jordan, MD;  Location: [facility] CATH LAB;  Service: Cardiovascular;  Laterality: N/A;    Family History  Problem Relation Age of Onset  . Hypothyroidism Mother   . Lung cancer      father and grand father but both smokers. Both deceased due to this.   . Diabetes Brother   . Hypothyroidism Sister   . Bipolar disorder Brother   . Colon cancer Neg Hx   . Rectal cancer Neg Hx   . Stomach cancer Neg Hx     Medications- reviewed and updated Current Outpatient Prescriptions  Medication Sig Dispense Refill  . ALPRAZolam (XANAX) 1 MG tablet Take 0.5-1 tablets (0.5-1 mg total) by mouth at bedtime as needed for sleep (with travel). 30 tablet 3  . aspirin 81 MG tablet Take 81 mg by mouth daily.    . atorvastatin (LIPITOR) 80 MG tablet Take 1 tablet (80 mg total) by mouth every evening. 90 tablet 3  . levothyroxine (SYNTHROID,  LEVOTHROID) 25 MCG tablet TAKE 1 TABLET DAILY BEFORE BREAKFAST 90 tablet 2  . metoprolol succinate (TOPROL XL) 25 MG 24 hr tablet Take 1 tablet (25 mg total) by mouth daily. 90 tablet 3   No current facility-administered medications for this visit.     Allergies-reviewed and updated No Known Allergies  Social History   Social History  . Marital status: Married    Spouse name: N/A  . Number of children: N/A  . Years of education: N/A   Social History Main Topics  . Smoking status: Never Smoker  . Smokeless tobacco: Never Used  . Alcohol use 2.4 oz/week    3 Standard drinks or equivalent, 1 Cans of beer per week  . Drug use: No  . Sexual activity: Yes   Other Topics Concern  . None   Social  History Narrative   Married for 26 years in 2015. 2 kids- 20 daughter-[employer] and 17 son.    Hobbies- yardwork, family time, travel[place]; sports-pro football and college basketball, music.    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 26 years-lots of travel previously lived in [place] ([place] years[place]     Objective: BP 132/88 (BP Location: Left Arm, Patient Position: Sitting, Cuff Size: Normal)   Pulse 82   Temp 98.6 F (37 C) (Oral)   Ht 5\' 7"  (1.702 m)   Wt 225 lb 3.2 oz (102.2 kg)   SpO2 96%   BMI 35.27 kg/m  Gen: NAD, resting comfortably HEENT: Mucous membranes are moist. Oropharynx normal Neck: no thyromegaly CV: RRR no murmurs rubs or gallops Lungs: CTAB no crackles, wheeze, rhonchi Abdomen: soft/nontender/nondistended/normal bowel sounds. No rebound or guarding.  Ext: no edema Skin: warm, dry Neuro: grossly normal, moves all extremities, PERRLA Rectal: normal tone, slightly enlargedprostate, no masses or tenderness   Assessment/Plan:  55 y.Rice. male presenting for annual physical.  Health Maintenance counseling: 1. Anticipatory guidance: Patient counseled regarding regular dental exams, eye exams, wearing seatbelts.  2. Risk factor reduction:  Advised patient of need for regular exercise and diet rich and fruits and vegetables to reduce risk of heart attack and stroke.  Wt Readings from Last 3 Encounters:  05/18/16 225 lb 3.2 oz (102.2 kg)  10/27/15 218 lb 6.4 oz (99.1 kg)  06/24/15 238 lb 8 oz (108.2 kg)  Exercise has been down- about to restart. Not eating as well. In [place] most of the time- has been tough on diet- more bachelor style- knows he needs to reverse this.  3. Immunizations/screenings/ancillary studies Immunization History  Administered Date(s) Administered  . Influenza-Unspecified 02/05/2015  . PPD Test 04/09/2014  . Tdap 04/09/2014   Health Maintenance Due  Topic Date  Due  . INFLUENZA VACCINE -today 01/26/2016   4. Prostate cancer screening- low risk based on rectal exam - will get PSA . Apparently had been treated for BPH in past. Trouble getting stream started. Weak stream.  BPH on exam- does not want treatmen Lab Results  Component Value Date   PSA 0.89 05/13/2015   PSA 0.66 09/03/2014   PSA 0.64 01/22/2014   5. Colon cancer screening - 08/04/14 with 10 year follow up 6. Skin cancer screening- saw dermatology within a year- given cream for psoriasis.   Status of chronic or acute concerns  CAD- saw Dr. SwazilandJordan in may. RCA stent 09/2014. On aspirin, statin, beta blocker.  History ischemic cadriomyoopathy- improved on repeat echo. Did have mild  aortic stenosis  Hypothyroidism- on levothyroxine 25 mcg- update tsh today  Insomnia- xanax only for travel. Has not taken in about 6 months- has if needed  HLD- on atorvastatin 80mg - update lipids. Ran out yesterday  Needing reading glasses- new. Advised to see optho.   Gingivitis- working with dentist.   May call in for ED meds- some trouble recently- considering cialis prn  6 month weight check in or 12 month physical  Orders Placed This Encounter  Procedures  . CBC    [place]  . Comprehensive metabolic panel    [place]    Order Specific Question:   Has the patient fasted?    Answer:   No  . Lipid panel    [place]    Order Specific Question:   Has the patient fasted?    Answer:   No  . PSA  . TSH    [place]    Meds ordered this encounter  Medications  . atorvastatin (LIPITOR) 80 MG tablet    Sig: Take 1 tablet (80 mg total) by mouth every evening.    Dispense:  90 tablet    Refill:  3    Return precautions advised.   Tana Conch, MD

## 2016-05-18 NOTE — Patient Instructions (Addendum)
Flu shot before you leave  Labs before you leave  Wt Readings from Last 3 Encounters:  05/18/16 225 lb 3.2 oz (102.2 kg)  10/27/15 218 lb 6.4 oz (99.1 kg)  06/24/15 238 lb 8 oz (108.2 kg)  gotta reverse the weight trend- you can do this again!   Go see an eye doctor just for a check in

## 2016-05-18 NOTE — Progress Notes (Signed)
Pre visit review using our clinic review tool, if applicable. No additional management support is needed unless otherwise documented below in the visit note. 

## 2016-06-19 NOTE — Progress Notes (Signed)
Cardiology Office Note   Date:  06/23/2016   ID:  Luke Rice, DOB [birth year], MRN [id number]  PCP:  Tana ConchStephen Hunter, MD  Cardiologist:  Dr. SwazilandJordan   History of Present Illness: Luke Rice is a 55 y.o. male with a history of CAD s/p PTCA/DES to prox RCA (09/2014), ischemic cardiomyopathy EF 45, HLD, hypothyroidism, chronic RBBB, morbid obesity and psoriasis who presents for follow up.  He presented in 2016 with symptoms of angina.  Nuc results were abnormal with  reversibility along the inferolateral wall near the apex and mid segment, findings concerning for stress-induced ischemia, mild diffuse HK, EF 44%.  Cardiac cath 10/20/14 which revealed: 100% occlusion of the proximal RCA with collaterals. The distal LCx was also occluded. There was a 50-60% mid LAD stenosis with normal FFR. He  underwent DES to RCA.  Medical therapy was recommended for residual CAD in PDA/PLOM/ and distal LCx OM. He had a follow up stress Myoview in Dec 2016 which showed a small area of ischemia in the basal inferior wall c/w LCx occlusion. Perfusion was markedly improved from prior study. No ischemia in LAD territory. EF returned to normal on follow up Echo and Myoview.  He is now working a lot in [place] with [employer]. He was seen there in September and had an ETT and Echo done. The Echo showed normal LV function with EF 59%. Normal valves. On ETT he reached 10 mets with no angina and no ST changes.  On follow up today he is feeling very well. No chest pain or SOB. Does not walk as much with job change.  Likes to [employer]. No bleeding. Still working on his diet with [employer] wat[employer].    Past Medical History:  Diagnosis Date  . BPH (benign prostatic hyperplasia)    describes treatment when lived out of country, not currently as bothresome  . Coronary artery disease    a.  LHC 10/20/14 s/p PTCA/DES to prox RCA, FFR of mLAD (found to be not hemodynamically significant), also occluded distal LCx prior to  terminal small OM branch with L-L collaterals. Residual dz for med rx.  . Dyslipidemia   . Hyperglycemia    a. A1C 6.1 in 09/2014.  Marland Kitchen. Hypothyroidism   . Ischemic cardiomyopathy    a. EF 45% by cath 09/2014.  . Obesity   . Plantar fasciitis of left foot   . Psoriasis   . RBBB   . Renal cyst 01/22/2014   17mm left kidney. Bozniak Class [facility]. No further follow up.      Past Surgical History:  Procedure Laterality Date  . CARDIAC CATHETERIZATION N/A 10/20/2014   Procedure: CORONARY STENT INTERVENTION;  Surgeon: Alen Matheson M SwazilandJordan, MD;  [facility];  Service: Cardiovascular;  Laterality: N/A;  . COLONOSCOPY     30s. concern for diverticulitis.   . CORONARY ANGIOPLASTY WITH STENT PLACEMENT  10/20/2014   "1"  . FRACTIONAL FLOW RESERVE WIRE  10/20/2014   Procedure: FRACTIONAL FLOW RESERVE WIRE;  Surgeon: Haven Foss M SwazilandJordan, MD;  [facility];  Service: Cardiovascular;;  . LEFT HEART CATHETERIZATION WITH CORONARY ANGIOGRAM N/A 10/20/2014   Procedure: LEFT HEART CATHETERIZATION WITH CORONARY ANGIOGRAM;  Surgeon: Sade Mehlhoff M SwazilandJordan, MD;  [facility];  Service: Cardiovascular;  Laterality: N/A;     Current Outpatient Prescriptions  Medication Sig Dispense Refill  . ALPRAZolam (XANAX) 1 MG tablet Take 0.5-1 tablets (0.5-1 mg total) by mouth at  bedtime as needed for sleep (with travel). 30 tablet 3  . aspirin 81 MG tablet Take 81 mg by mouth daily.    Marland Kitchen. atorvastatin (LIPITOR) 80 MG tablet Take 1 tablet (80 mg total) by mouth every evening. 90 tablet 3  . levothyroxine (SYNTHROID, LEVOTHROID) 25 MCG tablet Take 1 tablet (25 mcg total) by mouth daily before breakfast. 90 tablet 1  . metoprolol succinate (TOPROL XL) 25 MG 24 hr tablet Take 1 tablet (25 mg total) by mouth daily. 90 tablet 1   No current facility-administered medications for this visit.     Allergies:   Patient has no known allergies.    Social History:  The patient   reports that he has never smoked. He has never used smokeless tobacco. He reports that he drinks about 2.4 oz of alcohol per week . He reports that he does not use drugs.   Family History:  The patient's family history includes Bipolar disorder in his brother; Diabetes in his brother; Hypothyroidism in his mother and sister.    ROS:  Please see the history of present illness.   Otherwise, review of systems are positive for none.   All other systems are reviewed and negative.    PHYSICAL EXAM: VS:  BP 112/78 (BP Location: Left Arm)   Pulse 67   Ht 5\' 7"  (1.702 m)   Wt 231 lb 6.4 oz (105 kg)   BMI 36.24 kg/m  , BMI Body mass index is 36.24 kg/m. GEN: Well nourished, well developed, in no acute distress. Obese HEENT: normal  Neck: no JVD, carotid bruits, or masses Cardiac: RRR; no murmurs, rubs, or gallops,no edema  Respiratory:  clear to auscultation bilaterally, normal work of breathing GI: soft, nontender, nondistended, + BS MS: no deformity or atrophy  Skin: warm and dry, no rash Neuro:  Strength and sensation are intact Psych: euthymic mood, full affect   EKG:  EKG is  ordered today. NSR with RBBB. I have personally reviewed and interpreted this study.  Recent Labs: 05/18/2016: ALT 39; BUN 14; Creatinine, Ser 1.03; Hemoglobin 15.3; Platelets 280.0; Potassium 4.7; Sodium 140; TSH 4.82    Lipid Panel    Component Value Date/Time   CHOL 157 05/18/2016 1006   TRIG 151.0 (H) 05/18/2016 1006   HDL 49.40 05/18/2016 1006   CHOLHDL 3 05/18/2016 1006   VLDL 30.2 05/18/2016 1006   LDLCALC 77 05/18/2016 1006     Lab Results  Component Value Date   WBC 6.7 05/18/2016   HGB 15.3 05/18/2016   HCT 45.9 05/18/2016   PLT 280.0 05/18/2016   GLUCOSE 100 (H) 05/18/2016   CHOL 157 05/18/2016   TRIG 151.0 (H) 05/18/2016   HDL 49.40 05/18/2016   LDLCALC 77 05/18/2016   ALT 39 05/18/2016   AST 22 05/18/2016   NA 140 05/18/2016   K 4.7 05/18/2016   CL 103 05/18/2016   CREATININE  1.03 05/18/2016   BUN 14 05/18/2016   CO2 30 05/18/2016   TSH 4.82 (H) 05/18/2016   PSA 0.78 05/18/2016   INR 1.01 10/18/2014   HGBA1C 6.1 (A) 09/03/2014     Wt Readings from Last 3 Encounters:  06/23/16 231 lb 6.4 oz (105 kg)  05/18/16 225 lb 3.2 oz (102.2 kg)  10/27/15 218 lb 6.4 oz (99.1 kg)      Other studies Reviewed: Additional studies/ records that were reviewed today include: none    ASSESSMENT AND PLAN:   1. CAD-  s/p PTCA/DES to prox  RCA (09/2014) Myoview in Dec 2016 as noted above. Normal ETT in September in [place].  -- Continue ASA, statin and BB.  -- Encourage increased aerobic activity.   2. Ischemic CM- EF has recovered. Normal Echo in September in [place].  3. HLD- Continue statin. Lipids well controlled.   Current medicines are reviewed at length with the patient today.  The patient does not have concerns regarding medicines.  The following changes have been made:  none  Labs/ tests ordered today include:   Orders Placed This Encounter  Procedures  . EKG 12-Lead     Disposition:   FU with Dr. Swaziland in 6 months   Signed, Berley Gambrell Sw[place], MD  06/23/2016 11:41 AM

## 2016-06-23 ENCOUNTER — Ambulatory Visit (INDEPENDENT_AMBULATORY_CARE_PROVIDER_SITE_OTHER): Payer: Managed Care, Other (non HMO) | Admitting: Cardiology

## 2016-06-23 ENCOUNTER — Encounter: Payer: Self-pay | Admitting: Cardiology

## 2016-06-23 ENCOUNTER — Other Ambulatory Visit: Payer: Self-pay

## 2016-06-23 VITALS — BP 112/78 | HR 67 | Ht 67.0 in | Wt 231.4 lb

## 2016-06-23 DIAGNOSIS — E785 Hyperlipidemia, unspecified: Secondary | ICD-10-CM

## 2016-06-23 DIAGNOSIS — Z9861 Coronary angioplasty status: Secondary | ICD-10-CM | POA: Diagnosis not present

## 2016-06-23 DIAGNOSIS — I4510 Unspecified right bundle-branch block: Secondary | ICD-10-CM | POA: Diagnosis not present

## 2016-06-23 DIAGNOSIS — I2510 Atherosclerotic heart disease of native coronary artery without angina pectoris: Secondary | ICD-10-CM

## 2016-06-23 MED ORDER — ATORVASTATIN CALCIUM 80 MG PO TABS: 80.0000 mg | ORAL_TABLET | Freq: Every evening | ORAL | 3 refills | Status: DC

## 2016-06-23 MED ORDER — METOPROLOL SUCCINATE ER 25 MG PO TB24: 25.0000 mg | ORAL_TABLET | Freq: Every day | ORAL | 3 refills | Status: DC

## 2016-06-23 NOTE — Patient Instructions (Addendum)
Continue your current therapy  I will see you in 6 months.   

## 2016-10-08 IMAGING — NM NM MISC PROCEDURE
6 series · 36 of 36 positions shown · non-contrast
Comparison: none

[Series 1: wbr_r-proj_st wbr rest · 6.40mm/px · 6 of 64 frames shown]
[frame 6/64]
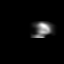
[frame 16/64]
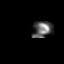
[frame 27/64]
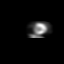
[frame 38/64]
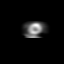
[frame 48/64]
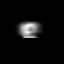
[frame 59/64]
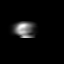

[Series 1: wbr rest · 6.40mm/px · 6 of 64 frames shown]
[frame 6/64]
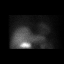
[frame 16/64]
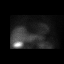
[frame 27/64]
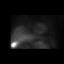
[frame 38/64]
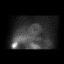
[frame 48/64]
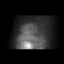
[frame 59/64]
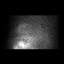

[Series 2: wbr_s-proj_st wbr stress-gsp · 6.40mm/px · 6 of 512 frames shown]
[frame 43/512]
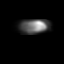
[frame 128/512]
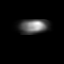
[frame 214/512]
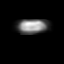
[frame 299/512]
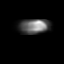
[frame 384/512]
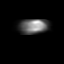
[frame 470/512]
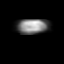

[Series 2: wbr stress-gsp · 6.40mm/px · 6 of 508 frames shown]
[frame 43/508  full-range]
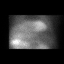
[frame 127/508  full-range]
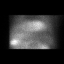
[frame 212/508  full-range]
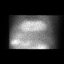
[frame 297/508  full-range]
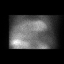
[frame 381/508  full-range]
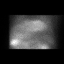
[frame 466/508  full-range]
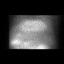

[Series 3: wbr_s-proj_st wbr stress-sum-em · 6.40mm/px · 6 of 64 frames shown]
[frame 6/64]
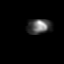
[frame 16/64]
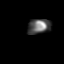
[frame 27/64]
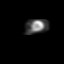
[frame 38/64]
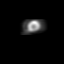
[frame 48/64]
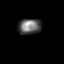
[frame 59/64]
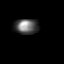

[Series 3: wbr stress-sum-em · 6.40mm/px · 6 of 64 frames shown]
[frame 6/64]
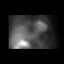
[frame 16/64]
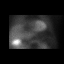
[frame 27/64]
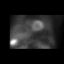
[frame 38/64]
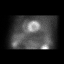
[frame 48/64]
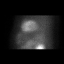
[frame 59/64]
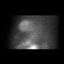

[36 of 36 positions shown; findings below may reference images not displayed]

Canned report from images found in remote index.

Refer to host system for actual result text.

## 2016-11-13 ENCOUNTER — Other Ambulatory Visit: Payer: Self-pay | Admitting: Cardiology

## 2016-11-13 ENCOUNTER — Other Ambulatory Visit: Payer: Self-pay | Admitting: Family Medicine

## 2016-11-14 ENCOUNTER — Other Ambulatory Visit: Payer: Self-pay

## 2016-11-14 MED ORDER — LEVOTHYROXINE SODIUM 25 MCG PO TABS: ORAL_TABLET | ORAL | 1 refills | Status: DC

## 2016-11-15 ENCOUNTER — Other Ambulatory Visit: Payer: Self-pay | Admitting: Cardiology

## 2016-11-15 NOTE — Telephone Encounter (Signed)
Should be filled by primary care  Peter Jordan MD, FACC   

## 2016-11-16 NOTE — Addendum Note (Signed)
Addended by: Neoma LamingPUGH, CHERYL J on: 11/16/2016 09:24 AM   Modules accepted: Orders

## 2016-11-19 ENCOUNTER — Other Ambulatory Visit: Payer: Self-pay | Admitting: Family Medicine

## 2017-01-06 ENCOUNTER — Encounter: Payer: Self-pay | Admitting: Cardiology

## 2017-01-06 ENCOUNTER — Encounter: Payer: Self-pay | Admitting: Family Medicine

## 2017-01-06 MED ORDER — CALCIPOTRIENE-BETAMETH DIPROP 0.005-0.064 % EX FOAM: 1.0000 "application " | Freq: Every day | CUTANEOUS | 1 refills | Status: DC

## 2017-01-09 ENCOUNTER — Encounter: Payer: Self-pay | Admitting: Family Medicine

## 2017-01-11 ENCOUNTER — Telehealth: Payer: Self-pay

## 2017-01-11 NOTE — Telephone Encounter (Signed)
Called and left a voicemail message asking for a return phone call in regards to a My Chart message that he had sent yesterday regarding his daughter. We are offering to work her in today at noon per Dr. Durene CalHunter. Awaiting a return phone call at this time.

## 2017-01-20 ENCOUNTER — Encounter: Payer: Self-pay | Admitting: *Deleted

## 2017-02-06 NOTE — Progress Notes (Signed)
Cardiology Office Note   Date:  02/07/2017   ID:  Luke Rice, DOB 06-09-1961, MRN [id number]  PCP:  Shelva Majestic, MD  Cardiologist:  Dr. Swaziland   History of Present Illness: Luke Rice is a 56 y.o. male with a history of CAD s/p PTCA/DES to prox RCA (09/2014), ischemic cardiomyopathy EF 45, HLD, hypothyroidism, chronic RBBB, morbid obesity and psoriasis who presents for follow up.  He presented in 2016 with symptoms of angina.  Nuc results were abnormal with  reversibility along the inferolateral wall near the apex and mid segment, findings concerning for stress-induced ischemia, mild diffuse HK, EF 44%.  Cardiac cath 10/20/14 which revealed: 100% occlusion of the proximal RCA with collaterals. The distal LCx was also occluded. There was a 50-60% mid LAD stenosis with normal FFR. He  underwent DES to RCA.  Medical therapy was recommended for residual CAD in PDA/PLOM/ and distal LCx OM. He had a follow up stress Myoview in Dec 2016 which showed a small area of ischemia in the basal inferior wall c/w LCx occlusion. Perfusion was markedly improved from prior study. No ischemia in LAD territory. EF returned to normal on follow up Echo and Myoview.  He was seen there in September 2017 and had an ETT and Echo done. The Echo showed normal LV function with EF 59%. Normal valves. On ETT he reached 10 mets with no angina and no ST changes.   On follow up today he is feeling very well. No chest pain or SOB. He is recently retired from [employer] and is thinking about doing something different. He is exercising more. He is in weight watchers and has lost 8 lbs.   Likes to [employer].    Past Medical History:  Diagnosis Date  . BPH (benign prostatic hyperplasia)    [place], not currently as bothresome  . Coronary artery disease    a.  LHC 10/20/14 s/p PTCA/DES to prox RCA, FFR of mLAD (found to be not hemodynamically significant), also occluded distal  LCx prior to terminal small OM branch with L-L collaterals. Residual dz for med rx.  . Dyslipidemia   . Hyperglycemia    a. A1C 6.1 in 09/2014.  Marland Kitchen Hypothyroidism   . Ischemic cardiomyopathy    a. EF 45% by cath 09/2014.  . Obesity   . Plantar fasciitis of left foot   . Psoriasis   . RBBB   . Renal cyst 01/22/2014   17mm left kidney. Bozniak Class I in discussion with [facility]. No further follow up.      Past Surgical History:  Procedure Laterality Date  . CARDIAC CATHETERIZATION N/A 10/20/2014   Procedure: CORONARY STENT INTERVENTION;  Surgeon: Andreah Goheen M Swaziland, MD;  Location: [facility] CATH LAB;  Service: Cardiovascular;  Laterality: N/A;  . COLONOSCOPY     30s. concern for diverticulitis.   . CORONARY ANGIOPLASTY WITH STENT PLACEMENT  10/20/2014   "1"  . FRACTIONAL FLOW RESERVE WIRE  10/20/2014   Procedure: FRACTIONAL FLOW RESERVE WIRE;  Surgeon: Erion Hermans M Swaziland, MD;  Location: [facility] CATH LAB;  Service: Cardiovascular;;  . LEFT HEART CATHETERIZATION WITH CORONARY ANGIOGRAM N/A 10/20/2014   Procedure: LEFT HEART CATHETERIZATION WITH CORONARY ANGIOGRAM;  Surgeon: Dago Jungwirth M Swaziland, MD;  Location: [facility] CATH LAB;  Service: Cardiovascular;  Laterality: N/A;     Current Outpatient Prescriptions  Medication Sig Dispense Refill  . ALPRAZolam (XANAX) 1 MG tablet Take 0.5-1 tablets (0.5-1 mg total)  by mouth at bedtime as needed for sleep (with travel). 30 tablet 3  . aspirin 81 MG tablet Take 81 mg by mouth daily.    Marland Kitchen. atorvastatin (LIPITOR) 80 MG tablet Take 1 tablet (80 mg total) by mouth every evening. 90 tablet 3  . levothyroxine (SYNTHROID, LEVOTHROID) 25 MCG tablet TAKE 1 TABLET BY MOUTH EVERY DAY BEFORE BREAKFAST. 90 tablet 1  . metoprolol succinate (TOPROL-XL) 25 MG 24 hr tablet TAKE 1 TABLET BY MOUTH EVERY DAY 90 tablet 1  . nitroGLYCERIN (NITROSTAT) 0.4 MG SL tablet Place 1 tablet (0.4 mg total) under the tongue every 5 (five) minutes as needed for chest pain. 90 tablet 3   No  current facility-administered medications for this visit.     Allergies:   Patient has no known allergies.    Social History:  The patient  reports that he has never smoked. He has never used smokeless tobacco. He reports that he drinks about 2.4 oz of alcohol per week . He reports that he does not use drugs.   Family History:  The patient's family history includes Bipolar disorder in his brother; Diabetes in his brother; Hypothyroidism in his mother and sister; Lung cancer in his unknown relative.    ROS:  Please see the history of present illness.   Otherwise, review of systems are positive for none.   All other systems are reviewed and negative.    PHYSICAL EXAM: VS:  BP 108/72   Pulse 62   Ht 5\' 7"  (1.702 m)   Wt 223 lb 12.8 oz (101.5 kg)   BMI 35.05 kg/m  , BMI Body mass index is 35.05 kg/m. GEN: Well nourished, well developed, in no acute distress. Obese HEENT: normal  Neck: no JVD, carotid bruits, or masses Cardiac: RRR; no murmurs, rubs, or gallops,no edema  Respiratory:  clear to auscultation bilaterally, normal work of breathing GI: soft, nontender, nondistended, + BS MS: no deformity or atrophy  Skin: warm and dry, no rash Neuro:  Strength and sensation are intact Psych: euthymic mood, full affect   EKG:  EKG is not ordered today.  Recent Labs: 05/18/2016: ALT 39; BUN 14; Creatinine, Ser 1.03; Hemoglobin 15.3; Platelets 280.0; Potassium 4.7; Sodium 140; TSH 4.82    Lipid Panel    Component Value Date/Time   CHOL 157 05/18/2016 1006   TRIG 151.0 (H) 05/18/2016 1006   HDL 49.40 05/18/2016 1006   CHOLHDL 3 05/18/2016 1006   VLDL 30.2 05/18/2016 1006   LDLCALC 77 05/18/2016 1006     Lab Results  Component Value Date   WBC 6.7 05/18/2016   HGB 15.3 05/18/2016   HCT 45.9 05/18/2016   PLT 280.0 05/18/2016   GLUCOSE 100 (H) 05/18/2016   CHOL 157 05/18/2016   TRIG 151.0 (H) 05/18/2016   HDL 49.40 05/18/2016   LDLCALC 77 05/18/2016   ALT 39 05/18/2016    AST 22 05/18/2016   NA 140 05/18/2016   K 4.7 05/18/2016   CL 103 05/18/2016   CREATININE 1.03 05/18/2016   BUN 14 05/18/2016   CO2 30 05/18/2016   TSH 4.82 (H) 05/18/2016   PSA 0.78 05/18/2016   INR 1.01 10/18/2014   HGBA1C 6.1 (A) 09/03/2014     Wt Readings from Last 3 Encounters:  02/07/17 223 lb 12.8 oz (101.5 kg)  06/23/16 231 lb 6.4 oz (105 kg)  05/18/16 225 lb 3.2 oz (102.2 kg)      Other studies Reviewed: Additional studies/ records that were reviewed  today include: none    ASSESSMENT AND PLAN:   1. CAD-  s/p PTCA/DES to prox RCA (09/2014) Myoview in Dec 2016 as noted above. Normal ETT in September 2017 in [place].  -- Continue ASA, statin and BB.  -- Encourage increased aerobic activity.   2. Ischemic CM- EF has recovered. Normal Echo in September in [place].  3. HLD- Continue statin. Will check fasting labs today.   Current medicines are reviewed at length with the patient today.  The patient does not have concerns regarding medicines.  The following changes have been made:  none  Labs/ tests ordered today include:   Orders Placed This Encounter  Procedures  . Basic metabolic panel  . Lipid Panel w/o Chol/HDL Ratio  . Hepatic function panel     Disposition:   FU with Dr. Swaziland in 6 months   Signed, Lyn Joens Swaziland, MD  02/07/2017 10:14 AM

## 2017-02-07 ENCOUNTER — Encounter: Payer: Self-pay | Admitting: Cardiology

## 2017-02-07 ENCOUNTER — Ambulatory Visit (INDEPENDENT_AMBULATORY_CARE_PROVIDER_SITE_OTHER): Payer: BLUE CROSS/BLUE SHIELD | Admitting: Cardiology

## 2017-02-07 VITALS — BP 108/72 | HR 62 | Ht 67.0 in | Wt 223.8 lb

## 2017-02-07 DIAGNOSIS — I2510 Atherosclerotic heart disease of native coronary artery without angina pectoris: Secondary | ICD-10-CM | POA: Diagnosis not present

## 2017-02-07 DIAGNOSIS — I4510 Unspecified right bundle-branch block: Secondary | ICD-10-CM | POA: Diagnosis not present

## 2017-02-07 DIAGNOSIS — E785 Hyperlipidemia, unspecified: Secondary | ICD-10-CM | POA: Diagnosis not present

## 2017-02-07 DIAGNOSIS — Z9861 Coronary angioplasty status: Secondary | ICD-10-CM

## 2017-02-07 MED ORDER — NITROGLYCERIN 0.4 MG SL SUBL: 0.4000 mg | SUBLINGUAL_TABLET | SUBLINGUAL | 3 refills | Status: DC | PRN

## 2017-02-07 NOTE — Patient Instructions (Signed)
Continue your current therapy  We will check blood work today  I will see you in

## 2017-02-08 ENCOUNTER — Telehealth: Payer: Self-pay | Admitting: Cardiology

## 2017-02-08 LAB — HEPATIC FUNCTION PANEL
ALT: 36 IU/L (ref 0–44)
AST: 24 IU/L (ref 0–40)
Albumin: 4.5 g/dL (ref 3.5–5.5)
Alkaline Phosphatase: 62 IU/L (ref 39–117)
Bilirubin Total: 0.8 mg/dL (ref 0.0–1.2)
Bilirubin, Direct: 0.2 mg/dL (ref 0.00–0.40)
Total Protein: 6.9 g/dL (ref 6.0–8.5)

## 2017-02-08 LAB — LIPID PANEL W/O CHOL/HDL RATIO
Cholesterol, Total: 116 mg/dL (ref 100–199)
HDL: 40 mg/dL (ref >39)
LDL Calculated: 61 mg/dL (ref 0–99)
Triglycerides: 74 mg/dL (ref 0–149)
VLDL Cholesterol Cal: 15 mg/dL (ref 5–40)

## 2017-02-08 LAB — BASIC METABOLIC PANEL
BUN/Creatinine Ratio: 17 (ref 9–20)
BUN: 17 mg/dL (ref 6–24)
CO2: 21 mmol/L (ref 20–29)
Calcium: 9.3 mg/dL (ref 8.7–10.2)
Chloride: 105 mmol/L (ref 96–106)
Creatinine, Ser: 0.98 mg/dL (ref 0.76–1.27)
GFR calc Af Amer: 101 mL/min/{1.73_m2} (ref >59)
GFR calc non Af Amer: 87 mL/min/{1.73_m2} (ref >59)
Glucose: 96 mg/dL (ref 65–99)
Potassium: 4.5 mmol/L (ref 3.5–5.2)
Sodium: 141 mmol/L (ref 134–144)

## 2017-02-08 NOTE — Telephone Encounter (Signed)
Returning your call. °

## 2017-02-08 NOTE — Telephone Encounter (Signed)
Returned call to patient no answer.LMTC. 

## 2017-02-09 NOTE — Telephone Encounter (Signed)
Returned call to patient lab results given. 

## 2017-03-07 ENCOUNTER — Encounter: Payer: Self-pay | Admitting: Family Medicine

## 2017-03-08 ENCOUNTER — Other Ambulatory Visit: Payer: Self-pay | Admitting: Family Medicine

## 2017-03-08 MED ORDER — CIPROFLOXACIN HCL 500 MG PO TABS: 500.0000 mg | ORAL_TABLET | Freq: Two times a day (BID) | ORAL | 0 refills | Status: AC

## 2017-03-08 NOTE — Progress Notes (Signed)
cipo

## 2017-05-16 ENCOUNTER — Other Ambulatory Visit: Payer: Self-pay | Admitting: *Deleted

## 2017-05-16 MED ORDER — ATORVASTATIN CALCIUM 80 MG PO TABS: 80.0000 mg | ORAL_TABLET | Freq: Every evening | ORAL | 0 refills | Status: DC

## 2017-05-22 ENCOUNTER — Ambulatory Visit (INDEPENDENT_AMBULATORY_CARE_PROVIDER_SITE_OTHER): Payer: BLUE CROSS/BLUE SHIELD | Admitting: Family Medicine

## 2017-05-22 ENCOUNTER — Encounter: Payer: Self-pay | Admitting: Family Medicine

## 2017-05-22 VITALS — BP 112/72 | HR 72 | Temp 98.3°F | Ht 67.0 in | Wt 228.8 lb

## 2017-05-22 DIAGNOSIS — I2510 Atherosclerotic heart disease of native coronary artery without angina pectoris: Secondary | ICD-10-CM | POA: Diagnosis not present

## 2017-05-22 DIAGNOSIS — Z9861 Coronary angioplasty status: Secondary | ICD-10-CM

## 2017-05-22 DIAGNOSIS — Z0000 Encounter for general adult medical examination without abnormal findings: Secondary | ICD-10-CM | POA: Diagnosis not present

## 2017-05-22 DIAGNOSIS — R351 Nocturia: Secondary | ICD-10-CM | POA: Diagnosis not present

## 2017-05-22 DIAGNOSIS — G4700 Insomnia, unspecified: Secondary | ICD-10-CM | POA: Diagnosis not present

## 2017-05-22 DIAGNOSIS — E785 Hyperlipidemia, unspecified: Secondary | ICD-10-CM

## 2017-05-22 DIAGNOSIS — L409 Psoriasis, unspecified: Secondary | ICD-10-CM | POA: Diagnosis not present

## 2017-05-22 DIAGNOSIS — N401 Enlarged prostate with lower urinary tract symptoms: Secondary | ICD-10-CM | POA: Diagnosis not present

## 2017-05-22 DIAGNOSIS — Z23 Encounter for immunization: Secondary | ICD-10-CM | POA: Diagnosis not present

## 2017-05-22 DIAGNOSIS — E039 Hypothyroidism, unspecified: Secondary | ICD-10-CM

## 2017-05-22 LAB — CBC
HCT: 42.5 % (ref 39.0–52.0)
Hemoglobin: 14.1 g/dL (ref 13.0–17.0)
MCHC: 33.1 g/dL (ref 30.0–36.0)
MCV: 87 fl (ref 78.0–100.0)
Platelets: 270 10*3/uL (ref 150.0–400.0)
RBC: 4.89 Mil/uL (ref 4.22–5.81)
RDW: 14 % (ref 11.5–15.5)
WBC: 7.2 10*3/uL (ref 4.0–10.5)

## 2017-05-22 LAB — TSH: TSH: 5.79 u[IU]/mL — ABNORMAL HIGH (ref 0.35–4.50)

## 2017-05-22 LAB — PSA: PSA: 0.53 ng/mL (ref 0.10–4.00)

## 2017-05-22 MED ORDER — ALPRAZOLAM 1 MG PO TABS: 0.5000 mg | ORAL_TABLET | Freq: Every evening | ORAL | 1 refills | Status: DC | PRN

## 2017-05-22 NOTE — Progress Notes (Signed)
Phone: [phone]  Subjective:  Patient presents today for their annual physical. Chief complaint-noted.   See problem oriented charting- ROS- full  review of systems was completed and negative except for: some cold intolerance, some concentration issues, rash with psoriasis  The following were reviewed and entered/updated in epic: Past Medical History:  Diagnosis Date  . BPH (benign prostatic hyperplasia)    [place], not currently as bothresome  . Coronary artery disease    a.  LHC 10/20/14 s/p PTCA/DES to prox RCA, FFR of mLAD (found to be not hemodynamically significant), also occluded distal LCx prior to terminal small OM branch with L-L collaterals. Residual dz for med rx.  . Dyslipidemia   . Hyperglycemia    a. A1C 6.1 in 09/2014.  Marland Kitchen. Hypothyroidism   . Ischemic cardiomyopathy    a. EF 45% by cath 09/2014.  . Obesity   . Plantar fasciitis of left foot   . Psoriasis   . RBBB   . Renal cyst 01/22/2014   17mm left kidney. Bozniak Class I [facility]. No further follow up.     Patient Active Problem List   Diagnosis Date Noted  . CAD S/P percutaneous coronary angioplasty 10/21/2014    Priority: High  . Insomnia 02/10/2015    Priority: Medium  . Dyslipidemia 10/21/2014    Priority: Medium  . Hypothyroidism     Priority: Medium  . RBBB     Priority: Low  . Obesity 01/22/2014    Priority: Low  . Migraine 01/22/2014    Priority: Low  . Psoriasis     Priority: Low  . Plantar fasciitis of left foot     Priority: Low  . BPH (benign prostatic hyperplasia)     Priority: Low   Past Surgical History:  Procedure Laterality Date  . CARDIAC CATHETERIZATION N/A 10/20/2014   Procedure: CORONARY STENT INTERVENTION;  Surgeon: Peter M SwazilandJordan, MD;  [facility];  Service: Cardiovascular;  Laterality: N/A;  . COLONOSCOPY     30s. concern for diverticulitis.   . CORONARY ANGIOPLASTY WITH STENT  PLACEMENT  10/20/2014   "1"  . FRACTIONAL FLOW RESERVE WIRE  10/20/2014   Procedure: FRACTIONAL FLOW RESERVE WIRE;  Surgeon: Peter M SwazilandJordan, MD;  [facility];  Service: Cardiovascular;;  . LEFT HEART CATHETERIZATION WITH CORONARY ANGIOGRAM N/A 10/20/2014   Procedure: LEFT HEART CATHETERIZATION WITH CORONARY ANGIOGRAM;  Surgeon: Peter M SwazilandJordan, MD;  [facility];  Service: Cardiovascular;  Laterality: N/A;    Family History  Problem Relation Age of Onset  . Hypothyroidism Mother   . Lung cancer Unknown        father and grand father but both smokers. Both deceased due to this.   . Diabetes Brother   . Hypothyroidism Sister   . Bipolar disorder Brother   . Colon cancer Neg Hx   . Rectal cancer Neg Hx   . Stomach cancer Neg Hx     Medications- reviewed and updated Current Outpatient Medications  Medication Sig Dispense Refill  . ALPRAZolam (XANAX) 1 MG tablet Take 0.5-1 tablets (0.5-1 mg total) by mouth at bedtime as needed for sleep (with travel). 30 tablet 3  . aspirin 81 MG tablet Take 81 mg by mouth daily.    Marland Kitchen. atorvastatin (LIPITOR) 80 MG tablet Take 1 tablet (80 mg total) by mouth every evening. 90 tablet 0  . levothyroxine (SYNTHROID, LEVOTHROID) 25 MCG tablet TAKE 1  TABLET BY MOUTH EVERY DAY BEFORE BREAKFAST. 90 tablet 1  . metoprolol succinate (TOPROL-XL) 25 MG 24 hr tablet TAKE 1 TABLET BY MOUTH EVERY DAY 90 tablet 1  . nitroGLYCERIN (NITROSTAT) 0.4 MG SL tablet Place 1 tablet (0.4 mg total) under the tongue every 5 (five) minutes as needed for chest pain. 90 tablet 3   Allergies-reviewed and updated No Known Allergies  Social History   Socioeconomic History  . Marital status: Married    Spouse name: None  . Number of children: None  . Years of education: None  . Highest education level: None  Social Needs  . Financial resource strain: None  . Food insecurity - worry: None  . Food insecurity - inability: None  . Transportation needs - medical: None   . Transportation needs - non-medical: None  Occupational History  . None  Tobacco Use  . Smoking status: Never Smoker  . Smokeless tobacco: Never Used  Substance and Sexual Activity  . Alcohol use: Yes    Alcohol/week: 2.4 oz    Types: 3 Standard drinks or equivalent, 1 Cans of beer per week  . Drug use: No  . Sexual activity: Yes  Other Topics Concern  . None  Social History Narrative   Married for 26 years in 2015. 2 kids- 2822 daughter at [employer] 20 son at [employer] 04/2017.    Hobbies- yardwork, family time, travel-[place], [place], [place]; sports-pro football and college basketball, music.    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 26 years-lots of travel previously lived in [place] ([place] years[place]     Objective: BP 112/72 (BP Location: Left Arm, Patient Position: Sitting, Cuff Size: Large)   Pulse 72   Temp 98.3 F (36.8 C) (Oral)   Ht 5\' 7"  (1.702 m)   Wt 228 lb 12.8 oz (103.8 kg)   SpO2 95%   BMI 35.84 kg/m  Gen: NAD, resting comfortably HEENT: Mucous membranes are moist. Oropharynx normal Neck: no thyromegaly CV: RRR no murmurs rubs or gallops Lungs: CTAB no crackles, wheeze, rhonchi Abdomen: soft/nontender/nondistended/normal bowel sounds. No rebound or guarding.  Ext: no edema Skin: warm, dry Neuro: grossly normal, moves all extremities, PERRLA Rectal: normal tone, diffusely mildly enlarged prostate, no masses or tenderness   Assessment/Plan:  56 y.o. male presenting for annual physical.  Health Maintenance counseling: 1. Anticipatory guidance: Patient counseled regarding regular dental exams q6 months, eye exams - advised to start at least every other yearly, wearing seatbelts.  2. Risk factor reduction:  Advised patient of need for regular exercise and diet rich and fruits and vegetables to reduce risk of heart attack and stroke. Exercise- doing some weight lifting but needs to do some cardio.  Diet-weight has slipped back up after getting down as low as 223.  Wt Readings from Last 3 Encounters:  05/22/17 228 lb 12.8 oz (103.8 kg)  02/07/17 223 lb 12.8 oz (101.5 kg)  06/23/16 231 lb 6.4 oz (105 kg)  3. Immunizations/screenings/ancillary studies- flu shot given. Discussed shingrix availability issues -hopeful for next year Immunization History  Administered Date(s) Administered  . Influenza,inj,Quad PF,6+ Mos 05/18/2016, 05/22/2017  . Influenza-Unspecified 02/05/2015  . PPD Test 04/09/2014  . Tdap 04/09/2014  4. Prostate cancer screening-  low risk prior psa trend . Has been told BPH in past- has trouble starting stream and with weak stream. Does not want treatment. Nocturia once a night.  Lab Results  Component Value Date  PSA 0.78 05/18/2016   PSA 0.89 05/13/2015   PSA 0.66 09/03/2014   5. Colon cancer screening - 07/2014 with 10 year follow up  6. Skin cancer screening- has seen dermatology for psoriasis- did see early this summer in [place]- light therapy has been helpful for him (tanning bed). advised regular sunscreen use. Denies worrisome, changing, or new skin lesions.   Status of chronic or acute concerns   CAD- saw Dr. Swaziland in august. RCA stent 09/2014. On aspirin, statin, beta blocker.  History ischemic cadiomyopathy- resolved by time of [place] echo.   Hypothyroidism- on levothyroxine 25 mcg- update tsh   Insomnia/anxiety- xanax only for travel. Refilled #30 with 1 refill to last at least a year.   HLD- on atorvastatin 80mg - updated lipids in august  Gingivitis- working with dentist- going in right direction  Some toenail fungus- did not complete 3 months rx from [place]. May retrial in future. LFTs 6  Weeks in.   Some erectile issues- declines meds for now  6 months advised. 1 year CPE at latest  Orders Placed This Encounter  Procedures  . Flu Vaccine QUAD 36+ mos IM  . CBC    [facility]  . TSH    [facility]  . PSA    Meds ordered this encounter    Medications  . DISCONTD: ALPRAZolam (XANAX) 1 MG tablet    Sig: Take 0.5-1 tablets (0.5-1 mg total) by mouth at bedtime as needed for sleep (with travel).    Dispense:  30 tablet    Refill:  1  . ALPRAZolam (XANAX) 1 MG tablet    Sig: Take 0.5-1 tablets (0.5-1 mg total) by mouth at bedtime as needed for sleep (with travel).    Dispense:  30 tablet    Refill:  1    Return precautions advised.  Tana Conch, MD

## 2017-05-22 NOTE — Patient Instructions (Addendum)
Please stop by lab before you go  Since you are stateside - lets have you back in 6 months to check in on weight Wt Readings from Last 3 Encounters:  05/22/17 228 lb 12.8 oz (103.8 kg)  02/07/17 223 lb 12.8 oz (101.5 kg)  06/23/16 231 lb 6.4 oz (105 kg)  I want you to set a goal of getting below 215 in that time frame. Healthy eating, regular exercise can go a long way for helping with this. j

## 2017-05-23 ENCOUNTER — Other Ambulatory Visit: Payer: Self-pay | Admitting: *Deleted

## 2017-05-23 MED ORDER — LEVOTHYROXINE SODIUM 50 MCG PO TABS
50.0000 ug | ORAL_TABLET | Freq: Every day | ORAL | 0 refills | Status: DC
Start: 2017-05-23 — End: 2017-05-25

## 2017-05-25 ENCOUNTER — Other Ambulatory Visit: Payer: Self-pay

## 2017-05-25 MED ORDER — METOPROLOL SUCCINATE ER 25 MG PO TB24: 25.0000 mg | ORAL_TABLET | Freq: Every day | ORAL | 1 refills | Status: DC

## 2017-05-25 MED ORDER — LEVOTHYROXINE SODIUM 50 MCG PO TABS: 50.0000 ug | ORAL_TABLET | Freq: Every day | ORAL | 0 refills | Status: DC

## 2017-07-04 ENCOUNTER — Other Ambulatory Visit: Payer: Self-pay

## 2017-07-04 ENCOUNTER — Other Ambulatory Visit (INDEPENDENT_AMBULATORY_CARE_PROVIDER_SITE_OTHER): Payer: BLUE CROSS/BLUE SHIELD

## 2017-07-04 ENCOUNTER — Telehealth: Payer: Self-pay | Admitting: Family Medicine

## 2017-07-04 ENCOUNTER — Encounter: Payer: Self-pay | Admitting: Family Medicine

## 2017-07-04 DIAGNOSIS — E039 Hypothyroidism, unspecified: Secondary | ICD-10-CM

## 2017-07-04 LAB — TSH: TSH: 3.14 u[IU]/mL (ref 0.35–4.50)

## 2017-07-04 NOTE — Telephone Encounter (Signed)
CRM for notification. See Telephone encounter for:   07/04/17.  Relation to pt: self  Call back number:[phone] Pharmacy:  Reason for call:  Patient requesting TSH lab orders stating he would like to come in today. Patient requesting to speak with Dr. Durene CalHunter nurse today this morning if possible, patient didn't want to elaborate, please advise

## 2017-07-04 NOTE — Telephone Encounter (Signed)
Patient in office today. Order placed for TSH

## 2017-07-04 NOTE — Telephone Encounter (Signed)
Please contact patient to advise on TSH labs he is requesting to today.

## 2017-08-18 ENCOUNTER — Other Ambulatory Visit: Payer: Self-pay

## 2017-08-18 MED ORDER — ATORVASTATIN CALCIUM 80 MG PO TABS: 80.0000 mg | ORAL_TABLET | Freq: Every evening | ORAL | 3 refills | Status: DC

## 2017-09-25 ENCOUNTER — Ambulatory Visit: Payer: BLUE CROSS/BLUE SHIELD | Admitting: Cardiology

## 2017-10-24 ENCOUNTER — Other Ambulatory Visit: Payer: Self-pay

## 2017-10-24 MED ORDER — METOPROLOL SUCCINATE ER 25 MG PO TB24: 25.0000 mg | ORAL_TABLET | Freq: Every day | ORAL | 1 refills | Status: DC

## 2017-11-18 NOTE — Progress Notes (Signed)
Cardiology Office Note   Date:  11/18/2017   ID:  Kayton Dunaj, DOB 1961/04/07, MRN [id number]  PCP:  Shelva Majestic, MD  Cardiologist:  Dr. Swaziland   History of Present Illness: Luke Rice is a 57 y.o. male with a history of CAD s/p PTCA/DES to prox RCA (09/2014), ischemic cardiomyopathy EF 45, HLD, hypothyroidism, chronic RBBB, morbid obesity and psoriasis who presents for follow up.  He presented in 2016 with symptoms of angina.  Nuc results were abnormal with  reversibility along the inferolateral wall near the apex and mid segment, findings concerning for stress-induced ischemia, mild diffuse HK, EF 44%.  Cardiac cath 10/20/14 which revealed: 100% occlusion of the proximal RCA with collaterals. The distal LCx was also occluded. There was a 50-60% mid LAD stenosis with normal FFR. He  underwent DES to RCA.  Medical therapy was recommended for residual CAD in PDA/PLOM/ and distal LCx OM. He had a follow up stress Myoview in Dec 2016 which showed a small area of ischemia in the basal inferior wall c/w LCx occlusion. Perfusion was markedly improved from prior study. No ischemia in LAD territory. EF returned to normal on follow up Echo and Myoview.  He was seen there in September 2017 and had an ETT and Echo done. The Echo showed normal LV function with EF 59%. Normal valves. On ETT he reached 10 mets with no angina and no ST changes.   On follow up today he is feeling very well. He denies any chest pain or SOB. Is working on finding a new job since he retired from [employer]. Admits he could do better with diet and exercise. One time last month he felt cold and had a clammy sweat. Thought it might be from heat stress.    Past Medical History:  Diagnosis Date  . BPH (benign prostatic hyperplasia)    [place], not currently as bothresome  . Coronary artery disease    a.  LHC 10/20/14 s/p PTCA/DES to prox RCA, FFR of mLAD (found to be not hemodynamically  significant), also occluded distal LCx prior to terminal small OM branch with L-L collaterals. Residual dz for med rx.  . Dyslipidemia   . Hyperglycemia    a. A1C 6.1 in 09/2014.  Marland Kitchen Hypothyroidism   . Ischemic cardiomyopathy    a. EF 45% by cath 09/2014.  . Obesity   . Plantar fasciitis of left foot   . Psoriasis   . RBBB   . Renal cyst 01/22/2014   17mm left kidney. Bozniak Class I in discussion with [facility]. No further follow up.      Past Surgical History:  Procedure Laterality Date  . CARDIAC CATHETERIZATION N/A 10/20/2014   Procedure: CORONARY STENT INTERVENTION;  Surgeon: Peter M Swaziland, MD;  Location: [facility] CATH LAB;  Service: Cardiovascular;  Laterality: N/A;  . COLONOSCOPY     30s. concern for diverticulitis.   . CORONARY ANGIOPLASTY WITH STENT PLACEMENT  10/20/2014   "1"  . FRACTIONAL FLOW RESERVE WIRE  10/20/2014   Procedure: FRACTIONAL FLOW RESERVE WIRE;  Surgeon: Peter M Swaziland, MD;  Location: [facility] CATH LAB;  Service: Cardiovascular;;  . LEFT HEART CATHETERIZATION WITH CORONARY ANGIOGRAM N/A 10/20/2014   Procedure: LEFT HEART CATHETERIZATION WITH CORONARY ANGIOGRAM;  Surgeon: Peter M Swaziland, MD;  Location: [facility] CATH LAB;  Service: Cardiovascular;  Laterality: N/A;     Current Outpatient Medications  Medication Sig Dispense Refill  . ALPRAZolam Prudy Feeler)  1 MG tablet Take 0.5-1 tablets (0.5-1 mg total) by mouth at bedtime as needed for sleep (with travel). 30 tablet 1  . aspirin 81 MG tablet Take 81 mg by mouth daily.    Marland Kitchen atorvastatin (LIPITOR) 80 MG tablet Take 1 tablet (80 mg total) by mouth every evening. 90 tablet 3  . levothyroxine (SYNTHROID, LEVOTHROID) 50 MCG tablet Take 1 tablet (50 mcg total) by mouth daily. 90 tablet 0  . metoprolol succinate (TOPROL-XL) 25 MG 24 hr tablet Take 1 tablet (25 mg total) by mouth daily. 90 tablet 1  . nitroGLYCERIN (NITROSTAT) 0.4 MG SL tablet Place 1 tablet (0.4 mg total) under the tongue every 5 (five) minutes as  needed for chest pain. 90 tablet 3   No current facility-administered medications for this visit.     Allergies:   Patient has no known allergies.    Social History:  The patient  reports that he has never smoked. He has never used smokeless tobacco. He reports that he drinks about 2.4 oz of alcohol per week. He reports that he does not use drugs.   Family History:  The patient's family history includes Bipolar disorder in his brother; Diabetes in his brother; Hypothyroidism in his mother and sister; Lung cancer in his unknown relative.    ROS:  Please see the history of present illness.   Otherwise, review of systems are positive for none.   All other systems are reviewed and negative.    PHYSICAL EXAM: VS:  There were no vitals taken for this visit. , BMI There is no height or weight on file to calculate BMI. GENERAL:  Well appearing, overweight WM in NAD HEENT:  PERRL, EOMI, sclera are clear. Oropharynx is clear. NECK:  No jugular venous distention, carotid upstroke brisk and symmetric, no bruits, no thyromegaly or adenopathy LUNGS:  Clear to auscultation bilaterally CHEST:  Unremarkable HEART:  RRR,  PMI not displaced or sustained,S1 and S2 within normal limits, no S3, no S4: no clicks, no rubs, no murmurs ABD:  Soft, nontender. BS +, no masses or bruits. No hepatomegaly, no splenomegaly EXT:  2 + pulses throughout, no edema, no cyanosis no clubbing SKIN:  Warm and dry.  No rashes NEURO:  Alert and oriented x 3. Cranial nerves II through XII intact. PSYCH:  Cognitively intact   EKG:  EKG is  ordered today. NSR rate 67. Occ. PVC. RBBB. I have personally reviewed and interpreted this study.   Recent Labs: 02/07/2017: ALT 36; BUN 17; Creatinine, Ser 0.98; Potassium 4.5; Sodium 141 05/22/2017: Hemoglobin 14.1; Platelets 270.0 07/04/2017: TSH 3.14    Lipid Panel    Component Value Date/Time   CHOL 116 02/07/2017 1020   TRIG 74 02/07/2017 1020   HDL 40 02/07/2017 1020    CHOLHDL 3 05/18/2016 1006   VLDL 30.2 05/18/2016 1006   LDLCALC 61 02/07/2017 1020     Lab Results  Component Value Date   WBC 7.2 05/22/2017   HGB 14.1 05/22/2017   HCT 42.5 05/22/2017   PLT 270.0 05/22/2017   GLUCOSE 96 02/07/2017   CHOL 116 02/07/2017   TRIG 74 02/07/2017   HDL 40 02/07/2017   LDLCALC 61 02/07/2017   ALT 36 02/07/2017   AST 24 02/07/2017   NA 141 02/07/2017   K 4.5 02/07/2017   CL 105 02/07/2017   CREATININE 0.98 02/07/2017   BUN 17 02/07/2017   CO2 21 02/07/2017   TSH 3.14 07/04/2017   PSA 0.53 05/22/2017  INR 1.01 10/18/2014   HGBA1C 6.1 (A) 09/03/2014     Wt Readings from Last 3 Encounters:  05/22/17 228 lb 12.8 oz (103.8 kg)  02/07/17 223 lb 12.8 oz (101.5 kg)  06/23/16 231 lb 6.4 oz (105 kg)      Other studies Reviewed: Additional studies/ records that were reviewed today include: none    ASSESSMENT AND PLAN:   1. CAD-  s/p PTCA/DES to prox RCA (09/2014) Myoview in Dec 2016 as noted above. Normal ETT in September 2017. He is asymptomatic.  -- Continue ASA, statin and BB.  -- Encourage more aerobic activity and weight loss  2. Ischemic CM- EF has recovered. Normal Echo in September 2017  3. HLD- Continue statin. Planning to have repeat lab work soon with Dr. Durene Cal.    Current medicines are reviewed at length with the patient today.  The patient does not have concerns regarding medicines.  The following changes have been made:  none  Labs/ tests ordered today include:   No orders of the defined types were placed in this encounter.    Disposition:   FU with Dr. Swaziland in 6 months   Signed, Peter Swaziland, MD  11/18/2017 3:56 PM

## 2017-11-21 ENCOUNTER — Ambulatory Visit: Payer: BLUE CROSS/BLUE SHIELD | Admitting: Family Medicine

## 2017-11-24 ENCOUNTER — Encounter: Payer: Self-pay | Admitting: Family Medicine

## 2017-11-24 ENCOUNTER — Other Ambulatory Visit: Payer: Self-pay

## 2017-11-24 ENCOUNTER — Encounter: Payer: Self-pay | Admitting: Cardiology

## 2017-11-24 ENCOUNTER — Encounter

## 2017-11-24 ENCOUNTER — Ambulatory Visit (INDEPENDENT_AMBULATORY_CARE_PROVIDER_SITE_OTHER): Payer: BLUE CROSS/BLUE SHIELD | Admitting: Cardiology

## 2017-11-24 ENCOUNTER — Ambulatory Visit (INDEPENDENT_AMBULATORY_CARE_PROVIDER_SITE_OTHER): Payer: BLUE CROSS/BLUE SHIELD | Admitting: Family Medicine

## 2017-11-24 VITALS — BP 104/72 | HR 67 | Ht 67.0 in | Wt 229.0 lb

## 2017-11-24 VITALS — BP 108/76 | HR 67 | Temp 98.4°F | Ht 67.0 in | Wt 230.4 lb

## 2017-11-24 DIAGNOSIS — Z9861 Coronary angioplasty status: Secondary | ICD-10-CM | POA: Diagnosis not present

## 2017-11-24 DIAGNOSIS — E785 Hyperlipidemia, unspecified: Secondary | ICD-10-CM

## 2017-11-24 DIAGNOSIS — E039 Hypothyroidism, unspecified: Secondary | ICD-10-CM | POA: Diagnosis not present

## 2017-11-24 DIAGNOSIS — G4700 Insomnia, unspecified: Secondary | ICD-10-CM | POA: Diagnosis not present

## 2017-11-24 DIAGNOSIS — I2510 Atherosclerotic heart disease of native coronary artery without angina pectoris: Secondary | ICD-10-CM

## 2017-11-24 DIAGNOSIS — L409 Psoriasis, unspecified: Secondary | ICD-10-CM | POA: Diagnosis not present

## 2017-11-24 DIAGNOSIS — I4510 Unspecified right bundle-branch block: Secondary | ICD-10-CM

## 2017-11-24 MED ORDER — ATORVASTATIN CALCIUM 80 MG PO TABS: 80.0000 mg | ORAL_TABLET | Freq: Every evening | ORAL | 3 refills | Status: DC

## 2017-11-24 MED ORDER — CALCIPOTRIENE-BETAMETH DIPROP 0.005-0.064 % EX FOAM: 1.0000 "application " | Freq: Every day | CUTANEOUS | 1 refills | Status: AC

## 2017-11-24 MED ORDER — METOPROLOL SUCCINATE ER 25 MG PO TB24: 25.0000 mg | ORAL_TABLET | Freq: Every day | ORAL | 3 refills | Status: DC

## 2017-11-24 MED ORDER — LEVOTHYROXINE SODIUM 50 MCG PO TABS: 50.0000 ug | ORAL_TABLET | Freq: Every day | ORAL | 3 refills | Status: DC

## 2017-11-24 NOTE — Assessment & Plan Note (Signed)
S: denies chest pain. No shortness of breath. Compliant with aspirin, metoprolol, statin.  A/P: just had cardiology follow up this AM

## 2017-11-24 NOTE — Assessment & Plan Note (Signed)
enstilar still helping him but uses sparingly. He asks about 2 spots on face- erythematous with slight scale. Could be AK. Mild discomfort with touching- he wants to trial hydrocortisone 1% for 7 days (in case not AK) - if not better could refer to derm or consider cryotherapy at CPE.

## 2017-11-24 NOTE — Assessment & Plan Note (Signed)
S:  controlled on atorvastatin 80mg  with last LDL at 61 in the fall  A/P: continue current medicine, update direct LDL per patient request- I suspect his #s are in normal range still <70. Add zetia perhaps if not

## 2017-11-24 NOTE — Assessment & Plan Note (Signed)
S: On thyroid medication- levothyroxine 50 mcg Lab Results  Component Value Date   TSH 3.14 07/04/2017  A/P: continue current rx

## 2017-11-24 NOTE — Progress Notes (Signed)
Subjective:  Luke Rice is a 57 y.o. year old very pleasant male patient who presents for/with See problem oriented charting ROS- No chest pain or shortness of breath. No headache or blurry vision.  Rash on face noted   Past Medical History-  Patient Active Problem List   Diagnosis Date Noted  . CAD S/P percutaneous coronary angioplasty 10/21/2014    Priority: High  . Psoriasis     Priority: High  . Insomnia 02/10/2015    Priority: Medium  . Dyslipidemia 10/21/2014    Priority: Medium  . Hypothyroidism     Priority: Medium  . RBBB     Priority: Low  . Obesity 01/22/2014    Priority: Low  . Migraine 01/22/2014    Priority: Low  . Plantar fasciitis of left foot     Priority: Low  . BPH (benign prostatic hyperplasia)     Priority: Low    Medications- reviewed and updated Current Outpatient Medications  Medication Sig Dispense Refill  . ALPRAZolam (XANAX) 1 MG tablet Take 0.5-1 tablets (0.5-1 mg total) by mouth at bedtime as needed for sleep (with travel). 30 tablet 1  . aspirin 81 MG tablet Take 81 mg by mouth daily.    Marland Kitchen atorvastatin (LIPITOR) 80 MG tablet Take 1 tablet (80 mg total) by mouth every evening. 90 tablet 3  . Calcipotriene-Betameth Diprop (ENSTILAR) 0.005-0.064 % FOAM Apply 1 application topically daily. For plaque psoriasis for 30 days maximum 60 g 1  . levothyroxine (SYNTHROID, LEVOTHROID) 50 MCG tablet Take 1 tablet (50 mcg total) by mouth daily. 90 tablet 3  . metoprolol succinate (TOPROL-XL) 25 MG 24 hr tablet Take 1 tablet (25 mg total) by mouth daily. 90 tablet 3  . nitroGLYCERIN (NITROSTAT) 0.4 MG SL tablet Place 1 tablet (0.4 mg total) under the tongue every 5 (five) minutes as needed for chest pain. 90 tablet 3   Objective: BP 108/76 (BP Location: Left Arm, Patient Position: Sitting, Cuff Size: Normal)   Pulse 67   Temp 98.4 F (36.9 C) (Oral)   Ht  (1.702 m)   Wt 230 lb 6.4 oz (104.5 kg)   SpO2 95%   BMI 36.09 kg/m  Gen: NAD, resting  comfortably CV: RRR no murmurs rubs or gallops Lungs: CTAB no crackles, wheeze, rhonchi Abdomen: soft/nontender/nondistended/normal bowel sounds.  Ext: no edema Skin: warm, dry, 5 mm and 15 mm erythematous patches on left cheek with fine scale on top  Assessment/Plan:  Hypothyroidism S: On thyroid medication- levothyroxine 50 mcg Lab Results  Component Value Date   TSH 3.14 07/04/2017  A/P: continue current rx  Insomnia S: patient continues to use xanax - gave #30 with 1 refill in November with hopes it would last a year. He has used this some to help him with sleep with anxiety outside of travel A/P: He refilled #30 on 10/23/17- I told him to call me when he needs a refill- too soon for refill at present.   Dyslipidemia S:  controlled on atorvastatin  with last LDL at 61 in the fall  A/P: continue current medicine, update direct LDL per patient request- I suspect his #s are in normal range still <70. Add zetia perhaps if not  CAD S/P percutaneous coronary angioplasty S: denies chest pain. No shortness of breath. Compliant with aspirin, metoprolol, statin.  A/P: just had cardiology follow up this AM  Psoriasis enstilar still helping him but uses sparingly. He asks about 2 spots on face- erythematous with slight scale.  Could be AK. Mild discomfort with touching- he wants to trial hydrocortisone 1% for 7 days (in case not AK) - if not better could refer to derm or consider cryotherapy at CPE.    Lab/Order associations: Dyslipidemia - Plan: TSH, Comprehensive metabolic panel, LDL cholesterol, direct, LDL cholesterol, direct, Comprehensive metabolic panell  Hypothyroidism, unspecified type - Plan: TSH, Comprehensive metabolic panel, Comprehensive metabolic panel, TSH  Meds ordered this encounter  Medications  . levothyroxine (SYNTHROID, LEVOTHROID) 50 MCG tablet    Sig: Take 1 tablet (50 mcg total) by mouth daily.    Dispense:  90 tablet    Refill:  3    Dosage has been  changed, discontinue Levothyroxine 25 mcg.  . Calcipotriene-Betameth Diprop (ENSTILAR) 0.005-0.064 % FOAM    Sig: Apply 1 application topically daily. For plaque psoriasis for 30 days maximum    Dispense:  60 g    Refill:  1   Return precautions advised.  Tana Conch, MD

## 2017-11-24 NOTE — Patient Instructions (Signed)
Continue your current therapy  Work on dietary modification and weight loss.  Exercise daily.

## 2017-11-24 NOTE — Assessment & Plan Note (Signed)
S: patient continues to use xanax - gave #30 with 1 refill in November with hopes it would last a year. He has used this some to help him with sleep with anxiety outside of travel A/P: He refilled #30 on 10/23/17- I told him to call me when he needs a refill- too soon for refill at present.

## 2017-11-24 NOTE — Patient Instructions (Addendum)
Please stop by lab before you go  No changes today  Trial hydrocortisone 1% twice a day for 7 days for spots on the face. If not better- we can put in a referral to dermatology if you want- can just send me a message through mychart  Another option would be at your physical if its still there to try cryotherapy as this could be an actinic keratosis/which is a precancerous lesion

## 2017-11-25 ENCOUNTER — Encounter: Payer: Self-pay | Admitting: Family Medicine

## 2017-11-25 LAB — COMPREHENSIVE METABOLIC PANEL
AG Ratio: 1.7 (calc) (ref 1.0–2.5)
ALT: 33 U/L (ref 9–46)
AST: 22 U/L (ref 10–35)
Albumin: 4.1 g/dL (ref 3.6–5.1)
Alkaline phosphatase (APISO): 49 U/L (ref 40–115)
BUN: 14 mg/dL (ref 7–25)
CO2: 23 mmol/L (ref 20–32)
Calcium: 9.3 mg/dL (ref 8.6–10.3)
Chloride: 106 mmol/L (ref 98–110)
Creat: 0.92 mg/dL (ref 0.70–1.33)
Globulin: 2.4 g/dL (calc) (ref 1.9–3.7)
Glucose, Bld: 99 mg/dL (ref 65–99)
Potassium: 4.3 mmol/L (ref 3.5–5.3)
Sodium: 140 mmol/L (ref 135–146)
Total Bilirubin: 0.6 mg/dL (ref 0.2–1.2)
Total Protein: 6.5 g/dL (ref 6.1–8.1)

## 2017-11-25 LAB — LDL CHOLESTEROL, DIRECT: Direct LDL: 61 mg/dL (ref <100)

## 2017-11-25 LAB — TSH: TSH: 4.56 mIU/L — ABNORMAL HIGH (ref 0.40–4.50)

## 2017-11-25 NOTE — Progress Notes (Signed)
Your CMET was normal (kidney, liver, and electrolytes, blood sugar).  Your cholesterol looks great with bad cholesterol under 70 at 61 Thyroid remains slightly undertreated. Please adjust levothyroxine to 75 mcg and help her set up a repeat tsh under hypothyroidism in 6 weeks.

## 2017-11-27 ENCOUNTER — Other Ambulatory Visit: Payer: Self-pay

## 2017-11-27 DIAGNOSIS — E039 Hypothyroidism, unspecified: Secondary | ICD-10-CM

## 2017-11-27 MED ORDER — LEVOTHYROXINE SODIUM 75 MCG PO TABS: 75.0000 ug | ORAL_TABLET | Freq: Every day | ORAL | 3 refills | Status: DC

## 2018-02-01 ENCOUNTER — Encounter: Payer: Self-pay | Admitting: Family Medicine

## 2018-02-01 ENCOUNTER — Other Ambulatory Visit (INDEPENDENT_AMBULATORY_CARE_PROVIDER_SITE_OTHER): Payer: BLUE CROSS/BLUE SHIELD

## 2018-02-01 DIAGNOSIS — E039 Hypothyroidism, unspecified: Secondary | ICD-10-CM | POA: Diagnosis not present

## 2018-02-02 LAB — TSH: TSH: 2.37 u[IU]/mL (ref 0.35–4.50)

## 2018-03-19 ENCOUNTER — Encounter: Payer: Self-pay | Admitting: Family Medicine

## 2018-03-27 DIAGNOSIS — L821 Other seborrheic keratosis: Secondary | ICD-10-CM | POA: Diagnosis not present

## 2018-03-27 DIAGNOSIS — L570 Actinic keratosis: Secondary | ICD-10-CM | POA: Diagnosis not present

## 2018-03-29 ENCOUNTER — Ambulatory Visit (INDEPENDENT_AMBULATORY_CARE_PROVIDER_SITE_OTHER): Payer: BLUE CROSS/BLUE SHIELD | Admitting: Family Medicine

## 2018-03-29 ENCOUNTER — Encounter: Payer: Self-pay | Admitting: Family Medicine

## 2018-03-29 VITALS — BP 121/70 | HR 67 | Temp 98.4°F | Ht 67.0 in | Wt 225.0 lb

## 2018-03-29 DIAGNOSIS — Z283 Underimmunization status: Secondary | ICD-10-CM | POA: Diagnosis not present

## 2018-03-29 DIAGNOSIS — Z7184 Encounter for health counseling related to travel: Secondary | ICD-10-CM

## 2018-03-29 DIAGNOSIS — Z789 Other specified health status: Secondary | ICD-10-CM

## 2018-03-29 DIAGNOSIS — Z23 Encounter for immunization: Secondary | ICD-10-CM

## 2018-03-29 DIAGNOSIS — Z2839 Other underimmunization status: Secondary | ICD-10-CM

## 2018-03-29 NOTE — Patient Instructions (Addendum)
Thanks for doing a flu shot today  Reasonable to trial a probiotic like align or Florastor  Reasonable to take a men's multivitamin-[employer] brand is fine  Please stop by lab before you go  There is a code for Hep B and measles shot status unknown but not a great code for Hep A- I still think we should pursue this (hoping cost is reasonable)   Progress on the new job- wishing you safe and healthy travels

## 2018-03-29 NOTE — Progress Notes (Signed)
Subjective:  Luke Rice is a 57 y.o. year old very pleasant male patient who presents for/with See problem oriented charting ROS-no chest pain or shortness of breath reported.  Reports illness with travel every few months but tends to recover and under 10 days.  No weight gain.  No increased edema.  Past Medical History-  Patient Active Problem List   Diagnosis Date Noted  . CAD S/P percutaneous coronary angioplasty 10/21/2014    Priority: High  . Psoriasis     Priority: High  . Insomnia 02/10/2015    Priority: Medium  . Dyslipidemia 10/21/2014    Priority: Medium  . Hypothyroidism     Priority: Medium  . RBBB     Priority: Low  . Obesity 01/22/2014    Priority: Low  . Migraine 01/22/2014    Priority: Low  . Plantar fasciitis of left foot     Priority: Low  . BPH (benign prostatic hyperplasia)     Priority: Low    Medications- reviewed and updated Current Outpatient Medications  Medication Sig Dispense Refill  . ALPRAZolam (XANAX) 1 MG tablet Take 0.5-1 tablets (0.5-1 mg total) by mouth at bedtime as needed for sleep (with travel). 30 tablet 1  . aspirin 81 MG tablet Take 81 mg by mouth daily.    Marland Kitchen atorvastatin (LIPITOR) 80 MG tablet Take 1 tablet (80 mg total) by mouth every evening. 90 tablet 3  . levothyroxine (SYNTHROID, LEVOTHROID) 75 MCG tablet Take 1 tablet (75 mcg total) by mouth daily. 90 tablet 3  . metoprolol succinate (TOPROL-XL) 25 MG 24 hr tablet Take 1 tablet (25 mg total) by mouth daily. 90 tablet 3  . nitroGLYCERIN (NITROSTAT) 0.4 MG SL tablet Place 1 tablet (0.4 mg total) under the tongue every 5 (five) minutes as needed for chest pain. 90 tablet 3   No current facility-administered medications for this visit.     Objective: BP 121/70 (BP Location: Left Arm, Patient Position: Sitting, Cuff Size: Large)   Pulse 67   Temp 98.4 F (36.9 C) (Oral)   Ht 5' 7"  (1.702 m)   Wt 225 lb (102.1 kg)   SpO2 97%   BMI 35.24 kg/m  Gen: NAD, resting  comfortably CV: RRR no murmurs rubs or gallops Lungs: CTAB no crackles, wheeze, rhonchi Abdomen: soft/nontender/nondistended/normal bowel sounds.  Ext: no edema Skin: warm, dry, recently with cryotherapy yesterday-several erythematous patches on face and head noted.  Results for orders placed or performed in visit on 03/29/18 (from the past 72 hour(s))  Measles/Mumps/Rubella Immunity     Status: Abnormal   Collection Time: 03/29/18  2:24 PM  Result Value Ref Range   Rubeola IgG <25.00 (L) AU/mL    Comment: AU/mL            Interpretation -----            -------------- <25.00           Negative 25.00-29.99      Equivocal >29.99           Positive . A positive result indicates that the patient has antibody to measles virus. It does not differentiate  between an active or past infection. The clinical  diagnosis must be interpreted in conjunction with  clinical signs and symptoms of the patient.    Mumps IgG 195.00 AU/mL    Comment:  AU/mL           Interpretation -------         ---------------- <9.00  Negative 9.00-10.99        Equivocal >10.99            Positive A positive result indicates that the patient has  antibody to mumps virus. It does not differentiate between an  active or past infection. The clinical diagnosis must be interpreted in conjunction with clinical signs and symptoms of the patient. .    Rubella 1.38 index    Comment:     Index            Interpretation     -----            --------------       <0.90            Not consistent with Immunity     0.90-0.99        Equivocal     > or = 1.00      Consistent with Immunity  . The presence of rubella IgG antibody suggests  immunization or past or current infection with rubella virus.   Hepatitis A antibody, total     Status: None   Collection Time: 03/29/18  2:24 PM  Result Value Ref Range   Hepatitis A AB,Total NON-REACTIVE NON-REACTI  Hepatitis B core antibody, total     Status: None    Collection Time: 03/29/18  2:24 PM  Result Value Ref Range   Hep B Core Total Ab NON-REACTIVE NON-REACTI  Hepatitis B surface antigen     Status: None   Collection Time: 03/29/18  2:24 PM  Result Value Ref Range   Hepatitis B Surface Ag NON-REACTIVE NON-REACTI  Hepatitis B surface antibody,qualitative     Status: None   Collection Time: 03/29/18  2:24 PM  Result Value Ref Range   Hep B S Ab NON-REACTIVE NON-REACTI    Assessment/Plan:   Counseling about travel S:  traveling to [place] and [place]. Will be traveling to them in 3 weeks bursts about 5 x a year to each. [facility] and is up to date except for japanese encephalitis- was very expensive (other than below concerns)  They advised to be checked for immunity to mumps as well as Hep A and B. He was vaccinated for Hep A and B in [place]. Probably couldn't get records.  Immunization History  Administered Date(s) Administered  . Influenza,inj,Quad PF,6+ Mos 05/18/2016, 05/22/2017, 03/29/2018  . Influenza-Unspecified 02/05/2015  . PPD Test 04/09/2014  . Tdap 04/09/2014   Patient is also worried about his immunity due to a few infections in last year- most of which he has been able to clear with self care at home. He asks about using multivitamin and probiotic.  A/P: Patient requesting immunity testing for Hep A, Hep B, MMR. Unfortunately not immune to Hep B, Hep A, or measles. He will return for 1x MMR. Will need twinrix at 0,1, 6 months.   Extended time counseling about building immune system. He was using a colloidal silver and a reviewed NIH site with him that stated no clear benefit and had potential harms- I advised against this. We also discussed probiotics and MV- I think it is reasonable for him to use probiotic like align or florastore and take a MV- we discussed data based recommendations do not clearly favor these but also that they are low risk to trial and with his travel very reasonable.   Future  Appointments  Date Time Provider [place]  05/23/2018  8:45 AM Marin Olp, MD [facility]  [facility]   Lab/Order associations: Counseling about travel  Measles, mumps, rubella (MMR) vaccination status unknown - Plan: Measles/Mumps/Rubella Immunity  Hepatitis B vaccination status unknown - Plan: Hepatitis B core antibody, total, Hepatitis B surface antigen, Hepatitis B surface antibody,qualitative  Hepatitis A vaccination not up to date - Plan: Hepatitis A antibody, total  Need for prophylactic vaccination and inoculation against influenza - Plan: Flu Vaccine QUAD 36+ mos IM  Time Stamp The duration of face-to-face time during this visit was greater than 25 minutes (1:50 PM- 2:19 PM). Greater than 50% of this time was spent in counseling, explanation of diagnosis, planning of further management, and/or coordination of care including primarily discussion of attempts to improve immunity with benefit/risk discussion as well as discussion of immunizations. .     Return precautions advised.  Garret Reddish, MD

## 2018-03-30 LAB — MEASLES/MUMPS/RUBELLA IMMUNITY
Mumps IgG: 195 AU/mL
Rubella: 1.38 index
Rubeola IgG: <25 AU/mL — ABNORMAL LOW

## 2018-03-30 LAB — HEPATITIS B CORE ANTIBODY, TOTAL: Hep B Core Total Ab: NONREACTIVE

## 2018-03-30 LAB — HEPATITIS A ANTIBODY, TOTAL: Hepatitis A AB,Total: NONREACTIVE

## 2018-03-30 LAB — HEPATITIS B SURFACE ANTIGEN: Hepatitis B Surface Ag: NONREACTIVE

## 2018-03-30 LAB — HEPATITIS B SURFACE ANTIBODY,QUALITATIVE: Hep B S Ab: NONREACTIVE

## 2018-04-06 ENCOUNTER — Ambulatory Visit (INDEPENDENT_AMBULATORY_CARE_PROVIDER_SITE_OTHER): Payer: BLUE CROSS/BLUE SHIELD

## 2018-04-06 DIAGNOSIS — Z23 Encounter for immunization: Secondary | ICD-10-CM | POA: Diagnosis not present

## 2018-04-06 NOTE — Progress Notes (Signed)
Patient here today for immunizations. MMR and Twinrix. Patient is leaving for [place] in 2 weeks. VIS sheets given for all immunizations  MMR administered in right arm. Tolerated well.  Twinrix administered in left arm. Tolerated well.

## 2018-04-06 NOTE — Patient Instructions (Signed)
There are no preventive care reminders to display for this patient.  Depression screen [facility] 2/9 05/22/2017 11/12/2014  Decreased Interest 0 0  Down, Depressed, Hopeless 0 0  PHQ - 2 Score 0 0

## 2018-04-15 ENCOUNTER — Encounter: Payer: Self-pay | Admitting: Family Medicine

## 2018-04-16 MED ORDER — TADALAFIL 20 MG PO TABS: 20.0000 mg | ORAL_TABLET | ORAL | 0 refills | Status: DC | PRN

## 2018-05-23 ENCOUNTER — Encounter: Payer: Self-pay | Admitting: Family Medicine

## 2018-05-23 ENCOUNTER — Ambulatory Visit (INDEPENDENT_AMBULATORY_CARE_PROVIDER_SITE_OTHER): Payer: BLUE CROSS/BLUE SHIELD | Admitting: Family Medicine

## 2018-05-23 VITALS — BP 108/74 | HR 74 | Temp 98.2°F | Ht 67.0 in | Wt 231.8 lb

## 2018-05-23 DIAGNOSIS — E039 Hypothyroidism, unspecified: Secondary | ICD-10-CM | POA: Diagnosis not present

## 2018-05-23 DIAGNOSIS — N401 Enlarged prostate with lower urinary tract symptoms: Secondary | ICD-10-CM | POA: Diagnosis not present

## 2018-05-23 DIAGNOSIS — Z9861 Coronary angioplasty status: Secondary | ICD-10-CM

## 2018-05-23 DIAGNOSIS — R351 Nocturia: Secondary | ICD-10-CM

## 2018-05-23 DIAGNOSIS — L409 Psoriasis, unspecified: Secondary | ICD-10-CM

## 2018-05-23 DIAGNOSIS — G4700 Insomnia, unspecified: Secondary | ICD-10-CM

## 2018-05-23 DIAGNOSIS — Z23 Encounter for immunization: Secondary | ICD-10-CM

## 2018-05-23 DIAGNOSIS — Z6836 Body mass index (BMI) 36.0-36.9, adult: Secondary | ICD-10-CM

## 2018-05-23 DIAGNOSIS — R739 Hyperglycemia, unspecified: Secondary | ICD-10-CM | POA: Diagnosis not present

## 2018-05-23 DIAGNOSIS — Z0000 Encounter for general adult medical examination without abnormal findings: Secondary | ICD-10-CM

## 2018-05-23 DIAGNOSIS — I2510 Atherosclerotic heart disease of native coronary artery without angina pectoris: Secondary | ICD-10-CM | POA: Diagnosis not present

## 2018-05-23 DIAGNOSIS — E785 Hyperlipidemia, unspecified: Secondary | ICD-10-CM

## 2018-05-23 LAB — COMPREHENSIVE METABOLIC PANEL
ALT: 28 U/L (ref 0–53)
AST: 23 U/L (ref 0–37)
Albumin: 4.1 g/dL (ref 3.5–5.2)
Alkaline Phosphatase: 46 U/L (ref 39–117)
BUN: 14 mg/dL (ref 6–23)
CO2: 29 mEq/L (ref 19–32)
Calcium: 9.1 mg/dL (ref 8.4–10.5)
Chloride: 105 mEq/L (ref 96–112)
Creatinine, Ser: 0.95 mg/dL (ref 0.40–1.50)
GFR: 87.34 mL/min (ref >60.00)
Glucose, Bld: 107 mg/dL — ABNORMAL HIGH (ref 70–99)
Potassium: 4.3 mEq/L (ref 3.5–5.1)
Sodium: 141 mEq/L (ref 135–145)
Total Bilirubin: 0.8 mg/dL (ref 0.2–1.2)
Total Protein: 6.6 g/dL (ref 6.0–8.3)

## 2018-05-23 LAB — LIPID PANEL
Cholesterol: 114 mg/dL (ref 0–200)
HDL: 45.7 mg/dL (ref >39.00)
LDL Cholesterol: 49 mg/dL (ref 0–99)
NonHDL: 68.18
Total CHOL/HDL Ratio: 2
Triglycerides: 98 mg/dL (ref 0.0–149.0)
VLDL: 19.6 mg/dL (ref 0.0–40.0)

## 2018-05-23 LAB — CBC
HCT: 42.1 % (ref 39.0–52.0)
Hemoglobin: 13.9 g/dL (ref 13.0–17.0)
MCHC: 33 g/dL (ref 30.0–36.0)
MCV: 85.3 fl (ref 78.0–100.0)
Platelets: 258 10*3/uL (ref 150.0–400.0)
RBC: 4.94 Mil/uL (ref 4.22–5.81)
RDW: 14 % (ref 11.5–15.5)
WBC: 8.5 10*3/uL (ref 4.0–10.5)

## 2018-05-23 LAB — TSH: TSH: 2.73 u[IU]/mL (ref 0.35–4.50)

## 2018-05-23 LAB — PSA: PSA: 0.83 ng/mL (ref 0.10–4.00)

## 2018-05-23 LAB — HEMOGLOBIN A1C: Hgb A1c MFr Bld: 6.1 % (ref 4.6–6.5)

## 2018-05-23 MED ORDER — KETOCONAZOLE 2 % EX CREA: 1.0000 "application " | TOPICAL_CREAM | Freq: Every day | CUTANEOUS | 0 refills | Status: DC

## 2018-05-23 NOTE — Patient Instructions (Addendum)
Please stop by lab before you go  Trial ketoconazole for absolute minimum of 2 weeks for the groin- need to cover all irritated areas.  Use a week past resolution.  Can ask dermatology opinion if not improving.  Thanks for doing your 2nd twinrirx. Need final April 11th or later.   Would recommend updated eye exam  Definitely 1 year for physical-happy to check in in 6 months if you would like as well.  Would love to see you down at least 5 to 10 pounds in that timeframe

## 2018-05-23 NOTE — Addendum Note (Signed)
Addended by: London SheerFRIZZELL, BAILEY T on: 05/23/2018 09:46 AM   Modules accepted: Orders

## 2018-05-23 NOTE — Addendum Note (Signed)
Addended by: Rivka SaferSOUTHERN HIZER, Asher MuirJAMIE M on: 05/23/2018 11:21 AM   Modules accepted: Orders

## 2018-05-23 NOTE — Progress Notes (Signed)
Phone: [phone]  Subjective:  Patient presents today for their annual physical. Chief complaint-noted.   See problem oriented charting- ROS- full  review of systems was completed and negative except for: rash/redness in groin  The following were reviewed and entered/updated in epic: Past Medical History:  Diagnosis Date  . BPH (benign prostatic hyperplasia)    describes [place] of [place], not currently as bothresome  . Coronary artery disease    a.  LHC 10/20/14 s/p PTCA/DES to prox RCA, FFR of mLAD (found to be not hemodynamically significant), also occluded distal LCx prior to terminal small OM branch with L-L collaterals. Residual dz for med rx.  . Dyslipidemia   . Hyperglycemia    a. A1C 6.1 in 09/2014.  Marland Kitchen Hypothyroidism   . Ischemic cardiomyopathy    a. EF 45% by cath 09/2014.  . Obesity   . Plantar fasciitis of left foot   . Psoriasis   . RBBB   . Renal cyst 01/22/2014   66m left kidney. [facility] up.     Patient Active Problem List   Diagnosis Date Noted  . CAD S/P percutaneous coronary angioplasty 10/21/2014    Priority: High  . Psoriasis     Priority: High  . Insomnia 02/10/2015    Priority: Medium  . Dyslipidemia 10/21/2014    Priority: Medium  . Hypothyroidism     Priority: Medium  . RBBB     Priority: Low  . Obesity 01/22/2014    Priority: Low  . Migraine 01/22/2014    Priority: Low  . Plantar fasciitis of left foot     Priority: Low  . BPH (benign prostatic hyperplasia)     Priority: Low   Past Surgical History:  Procedure Laterality Date  . CARDIAC CATHETERIZATION N/A 10/20/2014   Procedure: CORONARY STENT INTERVENTION;  Surgeon: Peter M JMartinique MD;  [facility];  Service: Cardiovascular;  Laterality: N/A;  . COLONOSCOPY     30s. concern for diverticulitis.   . CORONARY ANGIOPLASTY WITH STENT PLACEMENT  10/20/2014   "1"  . FRACTIONAL FLOW  RESERVE WIRE  10/20/2014   Procedure: FRACTIONAL FLOW RESERVE WIRE;  Surgeon: Peter M JMartinique MD;  [facility];  Service: Cardiovascular;;  . LEFT HEART CATHETERIZATION WITH CORONARY ANGIOGRAM N/A 10/20/2014   Procedure: LEFT HEART CATHETERIZATION WITH CORONARY ANGIOGRAM;  Surgeon: Peter M JMartinique MD;  [facility];  Service: Cardiovascular;  Laterality: N/A;    Family History  Problem Relation Age of Onset  . Hypothyroidism Mother   . Lung cancer Unknown        father and grand father but both smokers. Both deceased due to this.   . Diabetes Brother   . Hypothyroidism Sister   . Bipolar disorder Brother   . Colon cancer Neg Hx   . Rectal cancer Neg Hx   . Stomach cancer Neg Hx     Medications- reviewed and updated Current Outpatient Medications  Medication Sig Dispense Refill  . ALPRAZolam (XANAX) 1 MG tablet Take 0.5-1 tablets (0.5-1 mg total) by mouth at bedtime as needed for sleep (with travel). 30 tablet 1  . aspirin 81 MG tablet Take 81 mg by mouth daily.    .Marland Kitchenatorvastatin (LIPITOR) 80 MG tablet Take 1 tablet (80 mg total) by mouth every evening. 90 tablet 3  . levothyroxine (SYNTHROID, LEVOTHROID) 75 MCG tablet Take 1 tablet (75 mcg total) by mouth  daily. 90 tablet 3  . metoprolol succinate (TOPROL-XL) 25 MG 24 hr tablet Take 1 tablet (25 mg total) by mouth daily. 90 tablet 3  . tadalafil (ADCIRCA/CIALIS) 20 MG tablet Take 1 tablet (20 mg total) by mouth every 3 (three) days as needed for erectile dysfunction. 10 tablet 0  . ketoconazole (NIZORAL) 2 % cream Apply 1 application topically daily. For absolute minimum 2 weeks,use for 1 week past clearance of rash. 60 g 0  . nitroGLYCERIN (NITROSTAT) 0.4 MG SL tablet Place 1 tablet (0.4 mg total) under the tongue every 5 (five) minutes as needed for chest pain. 90 tablet 3   No current facility-administered medications for this visit.     Allergies-reviewed and updated No Known Allergies  Social History    Social History Narrative   Married for 26 years in 2015. 2 kids- 62 daughter at [place] 20 son at [place] 04/2017.    Hobbies- yardwork, family time, travel-[place], [place], [place]; sports-pro football and college basketball, music.       Looking for work now- was tired of international work   DID work with [employer] in [employer], educational now working in Photographer. 47 years-lots of travel previously lived in [place], [place] (12 years). Just relocated to [facility] 12/2013.     Objective: BP 108/74 (BP Location: Left Arm, Patient Position: Sitting, Cuff Size: Large)   Pulse 74   Temp 98.2 F (36.8 C) (Oral)   Ht 5' 7"  (1.702 m)   Wt 231 lb 12.8 oz (105.1 kg)   SpO2 95%   BMI 36.31 kg/m  Gen: NAD, resting comfortably HEENT: Mucous membranes are moist. Oropharynx normal Neck: no thyromegaly CV: RRR no murmurs rubs or gallops Lungs: CTAB no crackles, wheeze, rhonchi Abdomen: soft/nontender/nondistended/normal bowel sounds. No rebound or guarding.  Ext: no edema Skin: warm, dry Neuro: grossly normal, moves all extremities, PERRLA GU: Erythematous rash in bilateral groin with defined border- some cracking of the skin  Assessment/Plan:   57 y.o. male presenting for annual physical.  Health Maintenance counseling: 1. Anticipatory guidance: Patient counseled regarding regular dental exams -q6 months, eye exams-vision worsening- advsied updated exam ,  avoiding smoking and second hand smoke, limiting alcohol to 2 beverages per day.   2. Risk factor reduction:  Advised patient of need for regular exercise and diet rich and fruits and vegetables to reduce risk of heart attack and stroke. Exercise-last year was primarily doing weight lifting- not doing well lately- some sparing walking- needs to get into his gym- has had a lot of travel. Diet-weight up 3 pounds from last physical but stable from late 2017- had been gone 3 weeks in [place]- was hard to eat good consistent diet with travels-  feels like eating too much junk with dinner- french fries with traveling.  Wt Readings from Last 3 Encounters:  05/23/18 231 lb 12.8 oz (105.1 kg)  03/29/18 225 lb (102.1 kg)  11/24/17 230 lb 6.4 oz (104.5 kg)  3. Immunizations/screenings/ancillary studies-second Twinrix today.  Already had flu shot Immunization History  Administered Date(s) Administered  . Hep A / Hep B 04/06/2018  . Influenza,inj,Quad PF,6+ Mos 05/18/2016, 05/22/2017, 03/29/2018  . Influenza-Unspecified 02/05/2015  . MMR 04/06/2018  . PPD Test 04/09/2014  . Tdap 04/09/2014  4. Prostate cancer screening- low risk prior PSA trend.  Some trouble starting stream and with weak stream with nocturia once a night-has been told BPH in the past.  We deferred rectal unless concerning PSA trend Lab Results  Component Value Date   PSA 0.53 05/22/2017   PSA 0.78 05/18/2016   PSA 0.89 05/13/2015   5. Colon cancer screening - February 2016 with 10-year follow-up 6. Skin cancer screening-light therapy has been helpful for him.  Has seen dermatology in the past for psoriasis- seeing Dr. Doy Hutching in [place]. advised regular sunscreen use. Denies worrisome, changing, or new skin lesions.  7.  Never smoker  Status of chronic or acute concerns   Bad jock itch in July while in [place]- has not been able to eriadicate it. H. as done lotrimin and other antifungals but never been able to do BID consistently -Treat with ketoconazole-strongly encouraged him to be consistent  Hypothyroidism-has been controlled on levothyroxine 75 mcg most recently-update TSH with labs  Insomnia/travel anxiety -uses sparing Xanax-particularly with travel. Was tough with [place] trip recently- has enough left- last filled last november  Dyslipidemia/hyperlipidemia-has been reasonably controlled on atorvastatin 80 mg.  Update lipids  Coronary artery disease with history of RCA stent in 2016- remains chest pain-free.  Compliant with aspirin, statin, metoprolol.   Follows with Dr. Martinique  Psoriasis- enstilar is helping him using sparingly.  Last visit he had some spots on face that were erythematous with slight scale-we trialed hydrocortisone and didn't improve-  So was treated as AK and worked  Definitely 1 year for physical-happy to check in in 6 months if you would like as well.  Would love to see you down at least 5 to 10 pounds in that timeframe Lab/Order associations: Not fasting- country butter beans Preventative health care - Plan: CBC, Comprehensive metabolic panel, Lipid panel, TSH, PSA, Hemoglobin A1c  CAD S/P percutaneous coronary angioplasty  Dyslipidemia - Plan: CBC, Comprehensive metabolic panel, Lipid panel  Hypothyroidism, unspecified type - Plan: TSH  Benign prostatic hyperplasia with nocturia - Plan: PSA  Psoriasis  Insomnia, unspecified type  BMI 36.0-36.9,adult  Hyperglycemia - Plan: Hemoglobin A1c  Meds ordered this encounter  Medications  . ketoconazole (NIZORAL) 2 % cream    Sig: Apply 1 application topically daily. For absolute minimum 2 weeks,use for 1 week past clearance of rash.    Dispense:  60 g    Refill:  0    Return precautions advised.  Garret Reddish, MD

## 2018-05-29 ENCOUNTER — Telehealth: Payer: Self-pay | Admitting: Family Medicine

## 2018-05-29 ENCOUNTER — Other Ambulatory Visit: Payer: Self-pay

## 2018-05-29 MED ORDER — ALPRAZOLAM 1 MG PO TABS: 0.5000 mg | ORAL_TABLET | Freq: Every evening | ORAL | 1 refills | Status: DC | PRN

## 2018-05-29 NOTE — Telephone Encounter (Signed)
Prescription sent to pharmacy as requested.

## 2018-05-29 NOTE — Telephone Encounter (Signed)
MEDICATION: Alprazolam 1 mg  PHARMACY:  [facility]  IS THIS A 90 DAY SUPPLY :   IS PATIENT OUT OF MEDICATION:   IF NOT; HOW MUCH IS LEFT:   LAST APPOINTMENT DATE: @11 /27/2019  NEXT APPOINTMENT DATE:@5 /27/2020  OTHER COMMENTS:    **Let patient know to contact pharmacy at the end of the day to make sure medication is ready. **  ** Please notify patient to allow 48-72 hours to process**  **Encourage patient to contact the pharmacy for refills or they can request refills through [facility]**

## 2018-06-06 ENCOUNTER — Telehealth: Payer: Self-pay | Admitting: Family Medicine

## 2018-06-06 NOTE — Telephone Encounter (Signed)
Reviewed lab results and physician's note with patient. Stated he understood with no questions.

## 2018-08-28 DIAGNOSIS — D225 Melanocytic nevi of trunk: Secondary | ICD-10-CM | POA: Diagnosis not present

## 2018-08-28 DIAGNOSIS — L570 Actinic keratosis: Secondary | ICD-10-CM | POA: Diagnosis not present

## 2018-08-28 DIAGNOSIS — L820 Inflamed seborrheic keratosis: Secondary | ICD-10-CM | POA: Diagnosis not present

## 2018-08-28 DIAGNOSIS — L298 Other pruritus: Secondary | ICD-10-CM | POA: Diagnosis not present

## 2018-08-28 DIAGNOSIS — L400 Psoriasis vulgaris: Secondary | ICD-10-CM | POA: Diagnosis not present

## 2018-11-21 ENCOUNTER — Ambulatory Visit: Payer: BLUE CROSS/BLUE SHIELD | Admitting: Family Medicine

## 2018-11-23 ENCOUNTER — Telehealth: Payer: Self-pay | Admitting: Cardiology

## 2018-11-23 NOTE — Telephone Encounter (Signed)
Patient states he has had some weight gain- he states he thinks he would need to do blood work possibly, and has been behind on his visits.  Patient would like to know if a virtual visit would be okay in this case, or if he should come in to be seen.  I advised I would route a message to MD and Nurse to get their opinion. Patient appointment was cancelled for Monday- so if virtual visit is okay, he states he would like to get added on.  Thanks!

## 2018-11-23 NOTE — Telephone Encounter (Signed)
He is just due for yearly follow up so virtual visit is OK. He had labs in November so doesn't need them again  Luke Prindle [place] MD, [facility]

## 2018-11-23 NOTE — Telephone Encounter (Signed)
New Message   Patient is calling about his 06/1 appt being cancelled that he was not aware of. He is wanting to know is there somewhere that he can be worked in. Please call to discuss.

## 2018-11-23 NOTE — Telephone Encounter (Signed)
Spoke to patient Dr.Jordan's advice given.Virtual appointment scheduled with Dr.Jordan 12/24/18 at 10:00 am.Patient gave permission for a virtual appointment.

## 2018-11-26 ENCOUNTER — Encounter

## 2018-11-26 ENCOUNTER — Ambulatory Visit: Payer: BLUE CROSS/BLUE SHIELD | Admitting: Cardiology

## 2018-12-13 ENCOUNTER — Other Ambulatory Visit: Payer: Self-pay | Admitting: Family Medicine

## 2018-12-14 ENCOUNTER — Other Ambulatory Visit: Payer: Self-pay

## 2018-12-14 MED ORDER — ATORVASTATIN CALCIUM 80 MG PO TABS: 80.0000 mg | ORAL_TABLET | Freq: Every evening | ORAL | 0 refills | Status: DC

## 2018-12-14 MED ORDER — METOPROLOL SUCCINATE ER 25 MG PO TB24: 25.0000 mg | ORAL_TABLET | Freq: Every day | ORAL | 0 refills | Status: DC

## 2018-12-14 NOTE — Addendum Note (Signed)
Addended by: Diana Eves on: 12/14/2018 12:38 PM   Modules accepted: Orders

## 2018-12-14 NOTE — Telephone Encounter (Signed)
Rx(s) sent to pharmacy electronically.  

## 2018-12-18 ENCOUNTER — Telehealth: Payer: Self-pay | Admitting: Cardiology

## 2018-12-21 ENCOUNTER — Telehealth: Payer: Self-pay | Admitting: Cardiology

## 2018-12-21 NOTE — Telephone Encounter (Signed)
I called pt to confirm his appt with Dr Martinique on 12-24-18.

## 2018-12-21 NOTE — Progress Notes (Signed)
Virtual Visit via Video Note   This visit type was conducted due to national recommendations for restrictions regarding the COVID-19 Pandemic (e.g. social distancing) in an effort to limit this patient's exposure and mitigate transmission in our community.  Due to his co-morbid illnesses, this patient is at least at moderate risk for complications without adequate follow up.  This format is felt to be most appropriate for this patient at this time.  All issues noted in this document were discussed and addressed.  A limited physical exam was performed with this format.  Please refer to the patient's chart for his consent to telehealth [facility].   Date:  12/24/2018   ID:  Luke Rice, DOB June 18, 1961, MRN [id number]  Patient Location: Home Provider Location: Home  PCP:  Shelva MajesticHunter, Stephen O, MD  Cardiologist:  Kellene Mccleary [place] MD Electrophysiologist:  None   Evaluation Performed:  Follow-Up Visit  Chief Complaint:  Follow up CAD  History of Present Illness:    Luke Rice is a 58 y.o. male with a history of CAD s/p PTCA/DES to prox RCA (09/2014), ischemic cardiomyopathy EF 45, HLD, hypothyroidism, chronic RBBB, morbid obesity and psoriasis who presents for follow up.  He presented in 2016 with symptoms of angina.  Nuc results were abnormal with  reversibility along the inferolateral wall near the apex and mid segment, findings concerning for stress-induced ischemia, mild diffuse HK, EF 44%.  Cardiac cath 10/20/14 which revealed: 100% occlusion of the proximal RCA with collaterals. The distal LCx was also occluded. There was a 50-60% mid LAD stenosis with normal FFR. He  underwent DES to RCA.  Medical therapy was recommended for residual CAD in PDA/PLOM/ and distal LCx OM. He had a follow up stress Myoview in Dec 2016 which showed a small area of ischemia in the basal inferior wall c/w LCx occlusion. Perfusion was markedly improved from prior study. No ischemia in LAD territory. EF returned  to normal on follow up Echo and Myoview.  He was seen there in September 2017 and had an ETT and Echo done. The Echo showed normal LV function with EF 59%. Normal valves. On ETT he reached 10 mets with no angina and no ST changes.   On follow up today he is feeling  well. He denies any chest pain or SOB. He now has a new job working out of [employer]. With his new job and the pandemic he has not been exercising much in the past 4 months and has gained weight back. He would like to schedule a stress test.  The patient does not have symptoms concerning for COVID-19 infection (fever, chills, cough, or new shortness of breath).    Past Medical History:  Diagnosis Date  . BPH (benign prostatic hyperplasia)    describes treatment when lived out of country, not currently as bothresome  . Coronary artery disease    a.  LHC 10/20/14 s/p PTCA/DES to prox RCA, FFR of mLAD (found to be not hemodynamically significant), also occluded distal LCx prior to terminal small OM branch with L-L collaterals. Residual dz for med rx.  . Dyslipidemia   . Hyperglycemia    a. A1C 6.1 in 09/2014.  Marland Kitchen. Hypothyroidism   . Ischemic cardiomyopathy    a. EF 45% by cath 09/2014.  . Obesity   . Plantar fasciitis of left foot   . Psoriasis   . RBBB   . Renal cyst 01/22/2014   17mm left kidney. Bozniak Class I [facility]. No further follow up.     Past Surgical History:  Procedure Laterality Date  . CARDIAC CATHETERIZATION N/A 10/20/2014   Procedure: CORONARY STENT INTERVENTION;  Surgeon: Tierany Appleby M SwazilandJordan, MD;  [facility];  Service: Cardiovascular;  Laterality: N/A;  . COLONOSCOPY     30s. concern for diverticulitis.   . CORONARY ANGIOPLASTY WITH STENT PLACEMENT  10/20/2014   "1"  . FRACTIONAL FLOW RESERVE WIRE  10/20/2014   Procedure: FRACTIONAL FLOW RESERVE WIRE;  Surgeon: Jakwan Sally M SwazilandJordan, MD;  [facility];  Service: Cardiovascular;;  . LEFT HEART  CATHETERIZATION WITH CORONARY ANGIOGRAM N/A 10/20/2014   Procedure: LEFT HEART CATHETERIZATION WITH CORONARY ANGIOGRAM;  Surgeon: Daniela Hernan M SwazilandJordan, MD;  [facility];  Service: Cardiovascular;  Laterality: N/A;     Current Meds  Medication Sig  . ALPRAZolam (XANAX) 1 MG tablet Take 0.5-1 tablets (0.5-1 mg total) by mouth at bedtime as needed for sleep (with travel).  Marland Kitchen. aspirin 81 MG tablet Take 81 mg by mouth daily.  Marland Kitchen. atorvastatin (LIPITOR) 80 MG tablet Take 1 tablet (80 mg total) by mouth every evening. MUST KEEP APPOINTMENT 12/24/18 WITH DR SwazilandJORDAN FOR FUTURE REFILLS  . ketoconazole (NIZORAL) 2 % cream Apply 1 application topically daily. For absolute minimum 2 weeks,use for 1 week past clearance of rash.  . levothyroxine (SYNTHROID) 75 MCG tablet TAKE 1 TABLET BY MOUTH EVERY DAY  . metoprolol succinate (TOPROL-XL) 25 MG 24 hr tablet Take 1 tablet (25 mg total) by mouth daily. MUST KEEP APPOINTMENT 12/24/18 WITH DR SwazilandJORDAN FOR FUTURE REFILLS  . tadalafil (ADCIRCA/CIALIS) 20 MG tablet Take 1 tablet (20 mg total) by mouth every 3 (three) days as needed for erectile dysfunction.  . [DISCONTINUED] atorvastatin (LIPITOR) 80 MG tablet Take 1 tablet (80 mg total) by mouth every evening. MUST KEEP APPOINTMENT 12/24/18 WITH DR SwazilandJORDAN FOR FUTURE REFILLS  . [DISCONTINUED] metoprolol succinate (TOPROL-XL) 25 MG 24 hr tablet Take 1 tablet (25 mg total) by mouth daily. MUST KEEP APPOINTMENT 12/24/18 WITH DR SwazilandJORDAN FOR FUTURE REFILLS     Allergies:   Patient has no known allergies.   Social History   Tobacco Use  . Smoking status: Never Smoker  . Smokeless tobacco: Never Used  Substance Use Topics  . Alcohol use: Yes    Alcohol/week: 4.0 standard drinks    Types: 3 Standard drinks or equivalent, 1 Cans of beer per week  . Drug use: No     Family Hx: The patient's family history includes Bipolar disorder in his brother; Diabetes in his brother; Hypothyroidism in his mother and sister; Lung cancer  in his unknown relative. There is no history of Colon cancer, Rectal cancer, or Stomach cancer.  ROS:   Please see the history of present illness.    All other systems reviewed and are negative.   Prior CV studies:   The following studies were reviewed today:  none  Labs/Other Tests and Data Reviewed:    EKG:  No ECG reviewed.  Recent Labs: 05/23/2018: ALT 28; BUN 14; Creatinine, Ser 0.95; Hemoglobin 13.9; Platelets 258.0; Potassium 4.3; Sodium 141; TSH 2.73   Recent Lipid Panel Lab Results  Component Value Date/Time   CHOL 114 05/23/2018 09:46 AM   CHOL 116 02/07/2017 10:20 AM   TRIG 98.0 05/23/2018 09:46 AM   HDL 45.70 05/23/2018 09:46 AM   HDL 40 02/07/2017 10:20 AM   CHOLHDL 2 05/23/2018 09:46 AM   LDLCALC 49  05/23/2018 09:46 AM   LDLCALC 61 02/07/2017 10:20 AM   LDLDIRECT 61 11/24/2017 03:44 PM    Wt Readings from Last 3 Encounters:  12/24/18 228 lb (103.4 kg)  05/23/18 231 lb 12.8 oz (105.1 kg)  03/29/18 225 lb (102.1 kg)     Objective:    Vital Signs:  Ht 5\' 7"  (1.702 m)   Wt 228 lb (103.4 kg)   BMI 35.71 kg/m    VITAL SIGNS:  reviewed  ASSESSMENT & PLAN:     1. CAD-  s/p PTCA/DES to prox RCA (09/2014) Myoview in Dec 2016 as noted above. Normal ETT in September 2017. He is doing well without clear angina.  -- Continue ASA, statin and BB.  -- Encourage more aerobic activity and weight loss -- will arrange ETT at his convenience  2. Ischemic CM- EF has recovered. Normal Echo in September 2017  3. HLD- Continue statin. LDL 49.    COVID-19 Education: The signs and symptoms of COVID-19 were discussed with the patient and how to seek care for testing (follow up with PCP or arrange E-visit).  The importance of social distancing was discussed today.  Time:   Today, I have spent 10 minutes with the patient with telehealth technology discussing the above problems.     Medication Adjustments/Labs and Tests Ordered: Current medicines are reviewed at  length with the patient today.  Concerns regarding medicines are outlined above.   Tests Ordered: No orders of the defined types were placed in this encounter.   Medication Changes: Meds ordered this encounter  Medications  . metoprolol succinate (TOPROL-XL) 25 MG 24 hr tablet    Sig: Take 1 tablet (25 mg total) by mouth daily. MUST KEEP APPOINTMENT 12/24/18 WITH DR Martinique FOR FUTURE REFILLS    Dispense:  90 tablet    Refill:  3  . atorvastatin (LIPITOR) 80 MG tablet    Sig: Take 1 tablet (80 mg total) by mouth every evening. MUST KEEP APPOINTMENT 12/24/18 WITH DR Martinique FOR FUTURE REFILLS    Dispense:  90 tablet    Refill:  3  . nitroGLYCERIN (NITROSTAT) 0.4 MG SL tablet    Sig: Place 1 tablet (0.4 mg total) under the tongue every 5 (five) minutes as needed for chest pain.    Dispense:  90 tablet    Refill:  3    Follow Up:  In Person in 1 year(s)  Signed, Santiel Topper Martinique, MD  12/24/2018 10:01 AM    [place]

## 2018-12-24 ENCOUNTER — Telehealth (INDEPENDENT_AMBULATORY_CARE_PROVIDER_SITE_OTHER): Payer: BC Managed Care – PPO | Admitting: Cardiology

## 2018-12-24 ENCOUNTER — Encounter: Payer: Self-pay | Admitting: Cardiology

## 2018-12-24 VITALS — Ht 67.0 in | Wt 228.0 lb

## 2018-12-24 DIAGNOSIS — E785 Hyperlipidemia, unspecified: Secondary | ICD-10-CM | POA: Diagnosis not present

## 2018-12-24 DIAGNOSIS — I25118 Atherosclerotic heart disease of native coronary artery with other forms of angina pectoris: Secondary | ICD-10-CM | POA: Diagnosis not present

## 2018-12-24 MED ORDER — NITROGLYCERIN 0.4 MG SL SUBL: 0.4000 mg | SUBLINGUAL_TABLET | SUBLINGUAL | 3 refills | Status: DC | PRN

## 2018-12-24 MED ORDER — METOPROLOL SUCCINATE ER 25 MG PO TB24: 25.0000 mg | ORAL_TABLET | Freq: Every day | ORAL | 3 refills | Status: DC

## 2018-12-24 MED ORDER — ATORVASTATIN CALCIUM 80 MG PO TABS: 80.0000 mg | ORAL_TABLET | Freq: Every evening | ORAL | 3 refills | Status: DC

## 2018-12-24 NOTE — Patient Instructions (Addendum)
Medication Instructions:  Your Physician recommend you continue on your current medication as directed.    If you need a refill on your cardiac medications before your next appointment, please call your pharmacy.   Lab work: None  Testing/Procedures: Your physician has requested that you have an exercise tolerance test. For further information please visit [email or web address]. Please also follow instruction sheet, as given. [address]  Follow-Up: At [facility], you and your health needs are our priority.  As part of our continuing mission to provide you with exceptional heart care, we have created designated Provider Care Teams.  These Care Teams include your primary Cardiologist (physician) and Advanced Practice Providers (APPs -  Physician Assistants and Nurse Practitioners) who all work together to provide you with the care you need, when you need it. You will need a follow up appointment in 1 years.  Please call our office 2 months in advance to schedule this appointment.  You may see Dr. Martinique or one of the following Advanced Practice Providers on your designated Care Team: Almyra Deforest, [place] . Fabian Sharp, PA-C       [facility] [address] [place], [place] [address] Phone:  [phone]  December 24, 2018      Jeancarlo Leffler DOB: 1961/06/09 MRN: [id number] [place] [id number]   Dear Mr. Yerger,   You are scheduled for an Exercise Stress Test  Please arrive 15 minutes prior to your appointment time for registration and insurance purposes.  The test will take approximately 45 minutes to complete.  How to prepare for your Exercise Stress Test: . Hold Metoprolol day of test . Do wear comfortable clothes (no dresses or overalls) and walking shoes, tennis shoes preferred (no heels or open toed shoes are allowed) . Do Not wear cologne, perfume, aftershave or lotions (deodorant is  allowed). . Please report to [place], [address] for your test.  If these instructions are not followed, your test will have to be rescheduled.  If you have questions or concerns about your appointment, you can call the Stress Lab at [phone].  If you cannot keep your appointment, please provide 24 hours notification to the Stress Lab, to avoid a possible $50 charge to your account

## 2018-12-24 NOTE — Addendum Note (Signed)
Addended by: Meryl Crutch on: 12/24/2018 10:13 AM   Modules accepted: Orders

## 2018-12-25 DIAGNOSIS — L400 Psoriasis vulgaris: Secondary | ICD-10-CM | POA: Diagnosis not present

## 2018-12-25 NOTE — Telephone Encounter (Signed)
Opened in error

## 2019-01-24 ENCOUNTER — Telehealth (HOSPITAL_COMMUNITY): Payer: Self-pay

## 2019-01-24 DIAGNOSIS — Z9861 Coronary angioplasty status: Secondary | ICD-10-CM

## 2019-01-24 DIAGNOSIS — I2510 Atherosclerotic heart disease of native coronary artery without angina pectoris: Secondary | ICD-10-CM

## 2019-01-24 DIAGNOSIS — Z01812 Encounter for preprocedural laboratory examination: Secondary | ICD-10-CM

## 2019-01-24 NOTE — Telephone Encounter (Signed)
Returned call to patient no answer.LMTC. 

## 2019-01-24 NOTE — Telephone Encounter (Signed)
New message    The patient would like to be scheduled for his COVID test on 8.14.20 window between 2 pm or after.   There is a GXT scheduled on Tuesday, February 12, 2019, a patient of Dr. Martinique name Lyden Redner. Please call the patient and schedule/order COVID testing at [facility] the patient is aware the nurse will be calling to set up COVID test and is aware to self-isolate after the COVID testing is performed until the GXT appointment.    If the patient does not have the COVID testing performed and if we don't have the testing results back prior to the GXT appointment, we will have to cancel the test.

## 2019-01-29 NOTE — Telephone Encounter (Signed)
Spoke to patient advised he will need covid test done on 02/08/19.Stress test scheduled 02/12/19.Advised he can go to  [facility] between 8:00 am to 4:00 pm.Advised after test he will need to quarantine until after his stress test.

## 2019-02-06 ENCOUNTER — Telehealth (HOSPITAL_COMMUNITY): Payer: Self-pay

## 2019-02-06 NOTE — Telephone Encounter (Signed)
Encounter complete. 

## 2019-02-08 ENCOUNTER — Other Ambulatory Visit (HOSPITAL_COMMUNITY)
Admission: RE | Admit: 2019-02-08 | Discharge: 2019-02-08 | Disposition: A | Discharge status: Home / Self Care | Payer: BC Managed Care – PPO | Source: Ambulatory Visit | Attending: Cardiology | Admitting: Cardiology

## 2019-02-08 DIAGNOSIS — Z20828 Contact with and (suspected) exposure to other viral communicable diseases: Secondary | ICD-10-CM | POA: Diagnosis not present

## 2019-02-08 DIAGNOSIS — Z01812 Encounter for preprocedural laboratory examination: Secondary | ICD-10-CM | POA: Diagnosis not present

## 2019-02-08 LAB — SARS CORONAVIRUS 2 (TAT 6-24 HRS): SARS Coronavirus 2: NEGATIVE

## 2019-02-12 ENCOUNTER — Ambulatory Visit (HOSPITAL_COMMUNITY)
Admission: RE | Admit: 2019-02-12 | Discharge: 2019-02-12 | Disposition: A | Discharge status: Home / Self Care | Payer: BC Managed Care – PPO | Source: Ambulatory Visit | Attending: Cardiovascular Disease | Admitting: Cardiovascular Disease

## 2019-02-12 ENCOUNTER — Other Ambulatory Visit: Payer: Self-pay

## 2019-02-12 DIAGNOSIS — I25118 Atherosclerotic heart disease of native coronary artery with other forms of angina pectoris: Secondary | ICD-10-CM | POA: Insufficient documentation

## 2019-02-12 LAB — EXERCISE TOLERANCE TEST
Estimated workload: 10.3 METS
Exercise duration (min): 9 min
Exercise duration (sec): 10 s
MPHR: 164 {beats}/min
Peak HR: 151 {beats}/min
Percent HR: 92 %
Rest HR: 82 {beats}/min

## 2019-02-15 ENCOUNTER — Telehealth: Payer: Self-pay

## 2019-02-15 NOTE — Telephone Encounter (Signed)
Treadmill results sent to PCP Dr.Stephen [facility] via epic.

## 2019-03-07 ENCOUNTER — Ambulatory Visit: Payer: BC Managed Care – PPO

## 2019-05-27 ENCOUNTER — Other Ambulatory Visit: Payer: Self-pay | Admitting: Family Medicine

## 2019-05-28 ENCOUNTER — Encounter: Payer: BLUE CROSS/BLUE SHIELD | Admitting: Family Medicine

## 2019-07-10 ENCOUNTER — Other Ambulatory Visit: Payer: Self-pay

## 2019-07-11 ENCOUNTER — Encounter: Payer: Self-pay | Admitting: Family Medicine

## 2019-07-11 ENCOUNTER — Ambulatory Visit (INDEPENDENT_AMBULATORY_CARE_PROVIDER_SITE_OTHER): Payer: BC Managed Care – PPO | Admitting: Family Medicine

## 2019-07-11 VITALS — BP 118/76 | HR 69 | Temp 97.7°F | Ht 67.0 in | Wt 234.2 lb

## 2019-07-11 DIAGNOSIS — Z125 Encounter for screening for malignant neoplasm of prostate: Secondary | ICD-10-CM | POA: Diagnosis not present

## 2019-07-11 DIAGNOSIS — Z0000 Encounter for general adult medical examination without abnormal findings: Secondary | ICD-10-CM | POA: Diagnosis not present

## 2019-07-11 DIAGNOSIS — E039 Hypothyroidism, unspecified: Secondary | ICD-10-CM | POA: Diagnosis not present

## 2019-07-11 DIAGNOSIS — I2510 Atherosclerotic heart disease of native coronary artery without angina pectoris: Secondary | ICD-10-CM

## 2019-07-11 DIAGNOSIS — E785 Hyperlipidemia, unspecified: Secondary | ICD-10-CM | POA: Diagnosis not present

## 2019-07-11 DIAGNOSIS — Z9861 Coronary angioplasty status: Secondary | ICD-10-CM | POA: Diagnosis not present

## 2019-07-11 DIAGNOSIS — L409 Psoriasis, unspecified: Secondary | ICD-10-CM

## 2019-07-11 DIAGNOSIS — R739 Hyperglycemia, unspecified: Secondary | ICD-10-CM | POA: Diagnosis not present

## 2019-07-11 NOTE — Patient Instructions (Addendum)
Great to see you today  No changes unless labs lead [place] to make changes.   Get back on your weight loss journey !  Recommended follow up: Return in about 6 months (around 01/08/2020) for follow up- or sooner if needed. definitely 1 year at the latest

## 2019-07-11 NOTE — Progress Notes (Signed)
Phone: [phone]   Subjective:  Patient presents today for their annual physical. Chief complaint-noted.   See problem oriented charting- Review of Systems  Constitutional: Negative.   HENT: Negative.   Eyes: Negative.   Respiratory: Negative.   Cardiovascular: Negative.   Gastrointestinal: Negative.   Musculoskeletal: Negative.   Skin:       Patient has Psoriasis.  Neurological: Negative.   Endo/Heme/Allergies: Negative.   Psychiatric/Behavioral: Negative.    The following were reviewed and entered/updated in epic: Past Medical History:  Diagnosis Date  . BPH (benign prostatic hyperplasia)    [place], not currently as bothresome  . Coronary artery disease    a.  LHC 10/20/14 s/p PTCA/DES to prox RCA, FFR of mLAD (found to be not hemodynamically significant), also occluded distal LCx prior to terminal small OM branch with L-L collaterals. Residual dz for med rx.  . Dyslipidemia   . Hyperglycemia    a. A1C 6.1 in 09/2014.  Marland Kitchen Hypothyroidism   . Ischemic cardiomyopathy    a. EF 45% by cath 09/2014.  . Obesity   . Plantar fasciitis of left foot   . Psoriasis   . RBBB   . Renal cyst 01/22/2014   7m left kidney. [facility] up.     Patient Active Problem List   Diagnosis Date Noted  . CAD S/P percutaneous coronary angioplasty 10/21/2014    Priority: High  . Psoriasis     Priority: High  . Insomnia 02/10/2015    Priority: Medium  . Dyslipidemia 10/21/2014    Priority: Medium  . Hypothyroidism     Priority: Medium  . RBBB     Priority: Low  . Obesity 01/22/2014    Priority: Low  . Migraine 01/22/2014    Priority: Low  . Plantar fasciitis of left foot     Priority: Low  . BPH (benign prostatic hyperplasia)     Priority: Low   Past Surgical History:  Procedure Laterality Date  . CARDIAC CATHETERIZATION N/A 10/20/2014   Procedure: CORONARY STENT  INTERVENTION;  Surgeon: Peter M JMartinique MD;  [facility];  Service: Cardiovascular;  Laterality: N/A;  . COLONOSCOPY     30s. concern for diverticulitis.   . CORONARY ANGIOPLASTY WITH STENT PLACEMENT  10/20/2014   "1"  . FRACTIONAL FLOW RESERVE WIRE  10/20/2014   Procedure: FRACTIONAL FLOW RESERVE WIRE;  Surgeon: Peter M JMartinique MD;  [facility];  Service: Cardiovascular;;  . LEFT HEART CATHETERIZATION WITH CORONARY ANGIOGRAM N/A 10/20/2014   Procedure: LEFT HEART CATHETERIZATION WITH CORONARY ANGIOGRAM;  Surgeon: Peter M JMartinique MD;  [facility];  Service: Cardiovascular;  Laterality: N/A;    Family History  Problem Relation Age of Onset  . Hypothyroidism Mother   . Lung cancer Other        father and grand father but both smokers. Both deceased due to this.   . Diabetes Brother   . Hypothyroidism Sister   . Bipolar disorder Brother   . Colon cancer Neg Hx   . Rectal cancer Neg Hx   . Stomach cancer Neg Hx     Medications- reviewed and updated Current Outpatient Medications  Medication Sig Dispense Refill  . ALPRAZolam (XANAX) 1 MG tablet Take 0.5-1 tablets (0.5-1 mg total) by mouth at bedtime as needed for sleep (with travel). 30 tablet 1  . aspirin 81 MG tablet Take 81 mg  by mouth daily.    Marland Kitchen atorvastatin (LIPITOR) 80 MG tablet Take 1 tablet (80 mg total) by mouth every evening. MUST KEEP APPOINTMENT 12/24/18 WITH DR Martinique FOR FUTURE REFILLS 90 tablet 3  . levothyroxine (SYNTHROID) 75 MCG tablet TAKE 1 TABLET BY MOUTH EVERY DAY 90 tablet 1  . metoprolol succinate (TOPROL-XL) 25 MG 24 hr tablet Take 1 tablet (25 mg total) by mouth daily. MUST KEEP APPOINTMENT 12/24/18 WITH DR Martinique FOR FUTURE REFILLS 90 tablet 3  . nitroGLYCERIN (NITROSTAT) 0.4 MG SL tablet Place 1 tablet (0.4 mg total) under the tongue every 5 (five) minutes as needed for chest pain. 90 tablet 3  . tadalafil (ADCIRCA/CIALIS) 20 MG tablet Take 1 tablet (20 mg total) by mouth every 3  (three) days as needed for erectile dysfunction. (Patient not taking: Reported on 07/11/2019) 10 tablet 0   No current facility-administered medications for this visit.    Allergies-reviewed and updated No Known Allergies  Social History   Social History Narrative   Married for 26 years in 2015. 2 kids- 39 daughter at [employer] 20 son at [employer] 04/2017.    Hobbies- yardwork, family time, travel-[place], [place], [place]; sports-pro football and college basketball, music.       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 57 years-lots of travel previously lived in [place], [place] (12 years). Just relocated to [facility] 12/2013.    Objective  Objective:  BP 118/76   Pulse 69   Temp 97.7 F (36.5 C) (Temporal)   Ht 5' 7"  (1.702 m)   Wt 234 lb 3.2 oz (106.2 kg)   SpO2 94%   BMI 36.68 kg/m  Gen: NAD, resting comfortably HEENT: Mask not removed due to covid 19. TM normal. Bridge of nose normal. Eyelids normal.  Neck: no thyromegaly or cervical lymphadenopathy  CV: RRR no murmurs rubs or gallops Lungs: CTAB no crackles, wheeze, rhonchi Abdomen: soft/nontender/nondistended/normal bowel sounds. No rebound or guarding.  Ext: no edema Skin: warm, dry Neuro: grossly normal, moves all extremities, PERRLA    Assessment and Plan  59 y.o. male presenting for health maintenance counseling/follow-up Health Maintenance counseling: 1. Anticipatory guidance: Patient counseled regarding regular dental exams q6 months, Patient does not get eye exams yearly- no issues with vision other than readers  avoiding smoking and second hand smoke , limiting alcohol to a half glass of wine per week.  2. Risk factor reduction:  Advised patient of need for regular exercise and diet rich and fruits and vegetables to reduce risk of heart attack and stroke. Exercise- Patient says that he walks every other day. Diet-Patient says that he  hasn't gotten back on track since the holidays.  Weight up 6 pounds from June. Gained in holidays- wants to get down to 190- suggested considering calorie counting Wt Readings from Last 3 Encounters:  07/11/19 234 lb 3.2 oz (106.2 kg)  12/24/18 228 lb (103.4 kg)  05/23/18 231 lb 12.8 oz (105.1 kg)  3. Immunizations/screenings/ancillary studies-discussed Shingrix and defers as self pay.  Encouraged COVID-19 vaccination when available  Immunization History  Administered Date(s) Administered  . Hep A / Hep B 04/06/2018, 05/23/2018  . Influenza,inj,Quad PF,6+ Mos 05/18/2016, 05/22/2017, 03/29/2018, 04/02/2019  . Influenza-Unspecified 02/05/2015  . MMR 04/06/2018  . PPD Test 04/09/2014  . Tdap 04/09/2014  4. Prostate cancer screening- low risk prior PSA trend-update with labs today Lab Results  Component Value  Date   PSA 0.83 05/23/2018   PSA 0.53 05/22/2017   PSA 0.78 05/18/2016   5. Colon cancer screening -August 04, 2014 with 10-year repeat needed  6. Skin cancer screening-seeing dermatology in [place] for psoriasis- and also done skin check .  Advised regular sunscreen use. Denies worrisome, changing, or new skin lesions.  7. Pt is not a smoker  Status of chronic or acute concerns   CAD S/P percutaneous coronary angioplasty - Plan: CBC with Differential/Platelet, Comprehensive metabolic panel, Lipid panel -Patient follows with Dr. Martinique.  Reviewed note from 12/24/2018- out to yearly at this point.  CAD appears stable-medications unchanged.  Ejection fraction noted to have recovered from prior reduction. - stable today and asymptomatic- continue current meds.   Hypothyroidism, unspecified type - Plan: TSH.  Compliant with levothyroxine- hopefully stable stable, continue current meds while we await TSH  Psoriasis-Enstilar helps some but very expensive. Now on a [employer] care plan so cannot afford this.  Getting some intralesional injections which are helpful with dermatologist in  [facility]  Dyslipidemia-patient compliant with statin- atorvastatin 80 mg.  Last LDL was well controlled.  Update today with goal at least under 70-suspect stable-continue current medication  Hyperglycemia - Plan: Hemoglobin A1c  Lab Results  Component Value Date   HGBA1C 6.1 05/23/2018   HGBA1C 6.1 (A) 09/03/2014   HGBA1C 6.1 01/22/2014  -Elevated in the past.  Patient is going to work on lifestyle changes.  Update A1c with labs.  Hopefully stable mineral  Recommended follow up: 6 months reasonable-he may stretch to 1 year  Lab/Order associations: Not fasting   ICD-10-CM   1. CAD S/P percutaneous coronary angioplasty  I25.10 CBC with Differential/Platelet   Z98.61 Comprehensive metabolic panel    Lipid panel  2. Hypothyroidism, unspecified type  E03.9 TSH  3. Psoriasis  L40.9   4. Dyslipidemia  E78.5   5. Screening for prostate cancer  Z12.5 PSA  6. Hyperglycemia  R73.9 Hemoglobin A1c   Return precautions advised.  Garret Reddish, MD

## 2019-07-12 LAB — COMPREHENSIVE METABOLIC PANEL
ALT: 29 U/L (ref 0–53)
AST: 21 U/L (ref 0–37)
Albumin: 4.4 g/dL (ref 3.5–5.2)
Alkaline Phosphatase: 54 U/L (ref 39–117)
BUN: 17 mg/dL (ref 6–23)
CO2: 29 mEq/L (ref 19–32)
Calcium: 9.6 mg/dL (ref 8.4–10.5)
Chloride: 103 mEq/L (ref 96–112)
Creatinine, Ser: 1.14 mg/dL (ref 0.40–1.50)
GFR: 66.31 mL/min (ref >60.00)
Glucose, Bld: 97 mg/dL (ref 70–99)
Potassium: 4.3 mEq/L (ref 3.5–5.1)
Sodium: 140 mEq/L (ref 135–145)
Total Bilirubin: 0.7 mg/dL (ref 0.2–1.2)
Total Protein: 6.8 g/dL (ref 6.0–8.3)

## 2019-07-12 LAB — CBC WITH DIFFERENTIAL/PLATELET
Basophils Absolute: 0.1 10*3/uL (ref 0.0–0.1)
Basophils Relative: 1.1 % (ref 0.0–3.0)
Eosinophils Absolute: 0.1 10*3/uL (ref 0.0–0.7)
Eosinophils Relative: 1.9 % (ref 0.0–5.0)
HCT: 43.4 % (ref 39.0–52.0)
Hemoglobin: 14.2 g/dL (ref 13.0–17.0)
Lymphocytes Relative: 29.5 % (ref 12.0–46.0)
Lymphs Abs: 2.2 10*3/uL (ref 0.7–4.0)
MCHC: 32.6 g/dL (ref 30.0–36.0)
MCV: 86.4 fl (ref 78.0–100.0)
Monocytes Absolute: 0.7 10*3/uL (ref 0.1–1.0)
Monocytes Relative: 10 % (ref 3.0–12.0)
Neutro Abs: 4.2 10*3/uL (ref 1.4–7.7)
Neutrophils Relative %: 57.5 % (ref 43.0–77.0)
Platelets: 279 10*3/uL (ref 150.0–400.0)
RBC: 5.02 Mil/uL (ref 4.22–5.81)
RDW: 13.9 % (ref 11.5–15.5)
WBC: 7.4 10*3/uL (ref 4.0–10.5)

## 2019-07-12 LAB — LIPID PANEL
Cholesterol: 126 mg/dL (ref 0–200)
HDL: 44.5 mg/dL (ref >39.00)
LDL Cholesterol: 54 mg/dL (ref 0–99)
NonHDL: 81.3
Total CHOL/HDL Ratio: 3
Triglycerides: 139 mg/dL (ref 0.0–149.0)
VLDL: 27.8 mg/dL (ref 0.0–40.0)

## 2019-07-12 LAB — HEMOGLOBIN A1C: Hgb A1c MFr Bld: 6.1 % (ref 4.6–6.5)

## 2019-07-12 LAB — PSA: PSA: 0.84 ng/mL (ref 0.10–4.00)

## 2019-07-12 LAB — TSH: TSH: 4.37 u[IU]/mL (ref 0.35–4.50)

## 2019-09-18 ENCOUNTER — Telehealth: Payer: Self-pay

## 2019-09-18 NOTE — Telephone Encounter (Signed)
Immunization has been updated

## 2019-09-18 NOTE — Telephone Encounter (Signed)
1st covid vaccine pfizer 08/27/19 at [facility]

## 2019-09-20 ENCOUNTER — Ambulatory Visit: Payer: Self-pay | Attending: Internal Medicine

## 2019-09-20 DIAGNOSIS — Z23 Encounter for immunization: Secondary | ICD-10-CM

## 2019-09-20 NOTE — Progress Notes (Signed)
   Covid-19 Vaccination Clinic  Name:  Bryceton Hantz    MRN: [id number] DOB: 06/28/1960  09/20/2019  Mr. Baugh was observed post Covid-19 immunization for 15 minutes without incident. He was provided with Vaccine Information Sheet and instruction to access the V-Safe system.   Mr. Kohen was instructed to call 911 with any severe reactions post vaccine: Marland Kitchen Difficulty breathing  . Swelling of face and throat  . A fast heartbeat  . A bad rash all over body  . Dizziness and weakness   Immunizations Administered    Name Date Dose VIS Date Route   Pfizer COVID-19 Vaccine 09/20/2019 10:24 AM 0.3 mL 06/07/2019 Intramuscular   Manufacturer: [employer], [employer]   Lot: [id number]   NDC: [address]-5

## 2019-10-09 ENCOUNTER — Encounter: Payer: Self-pay | Admitting: Family Medicine

## 2019-10-10 ENCOUNTER — Other Ambulatory Visit: Payer: Self-pay

## 2019-10-10 ENCOUNTER — Encounter: Payer: Self-pay | Admitting: Family Medicine

## 2019-10-10 ENCOUNTER — Ambulatory Visit (HOSPITAL_COMMUNITY)
Admission: RE | Admit: 2019-10-10 | Discharge: 2019-10-10 | Disposition: A | Discharge status: Home / Self Care | Payer: Self-pay | Source: Ambulatory Visit | Attending: Family Medicine | Admitting: Family Medicine

## 2019-10-10 ENCOUNTER — Ambulatory Visit (INDEPENDENT_AMBULATORY_CARE_PROVIDER_SITE_OTHER): Payer: Self-pay | Admitting: Family Medicine

## 2019-10-10 VITALS — BP 120/82 | HR 70 | Temp 97.5°F | Ht 67.0 in | Wt 236.4 lb

## 2019-10-10 DIAGNOSIS — N50811 Right testicular pain: Secondary | ICD-10-CM | POA: Insufficient documentation

## 2019-10-10 DIAGNOSIS — I2510 Atherosclerotic heart disease of native coronary artery without angina pectoris: Secondary | ICD-10-CM

## 2019-10-10 DIAGNOSIS — Z9861 Coronary angioplasty status: Secondary | ICD-10-CM

## 2019-10-10 DIAGNOSIS — N529 Male erectile dysfunction, unspecified: Secondary | ICD-10-CM

## 2019-10-10 LAB — POCT URINALYSIS DIP (MANUAL ENTRY)
Bilirubin, UA: NEGATIVE
Blood, UA: NEGATIVE
Glucose, UA: NEGATIVE mg/dL
Ketones, POC UA: NEGATIVE mg/dL
Leukocytes, UA: NEGATIVE
Nitrite, UA: NEGATIVE
Protein Ur, POC: NEGATIVE mg/dL
Spec Grav, UA: 1.025 (ref 1.010–1.025)
Urobilinogen, UA: 1 E.U./dL
pH, UA: 6 (ref 5.0–8.0)

## 2019-10-10 MED ORDER — LEVOFLOXACIN 500 MG PO TABS: 500.0000 mg | ORAL_TABLET | Freq: Every day | ORAL | 0 refills | Status: DC

## 2019-10-10 MED ORDER — NITROGLYCERIN 0.4 MG SL SUBL: 0.4000 mg | SUBLINGUAL_TABLET | SUBLINGUAL | 3 refills | Status: DC | PRN

## 2019-10-10 NOTE — Progress Notes (Addendum)
Phone [phone] In person visit   Subjective:   Luke Rice is a 59 y.o. year old very pleasant male patient who presents for/with See problem oriented charting Chief Complaint  Patient presents with  . Testicle Pain   This visit occurred during the SARS-CoV-2 public health emergency.  Safety protocols were in place, including screening questions prior to the visit, additional usage of staff PPE, and extensive cleaning of exam room while observing appropriate contact time as indicated for disinfecting solutions.   Past Medical History-  Patient Active Problem List   Diagnosis Date Noted  . CAD S/P percutaneous coronary angioplasty 10/21/2014    Priority: High  . Psoriasis     Priority: High  . Insomnia 02/10/2015    Priority: Medium  . Dyslipidemia 10/21/2014    Priority: Medium  . Hypothyroidism     Priority: Medium  . Erectile dysfunction 10/10/2019    Priority: Low  . RBBB     Priority: Low  . Obesity 01/22/2014    Priority: Low  . Migraine 01/22/2014    Priority: Low  . Plantar fasciitis of left foot     Priority: Low  . BPH (benign prostatic hyperplasia)     Priority: Low    Medications- reviewed and updated Current Outpatient Medications  Medication Sig Dispense Refill  . ALPRAZolam (XANAX) 1 MG tablet Take 0.5-1 tablets (0.5-1 mg total) by mouth at bedtime as needed for sleep (with travel). 30 tablet 1  . aspirin 81 MG tablet Take 81 mg by mouth daily.    Marland Kitchen atorvastatin (LIPITOR) 80 MG tablet Take 1 tablet (80 mg total) by mouth every evening. MUST KEEP APPOINTMENT 12/24/18 WITH DR Swaziland FOR FUTURE REFILLS 90 tablet 3  . levothyroxine (SYNTHROID) 75 MCG tablet TAKE 1 TABLET BY MOUTH EVERY DAY 90 tablet 1  . metoprolol succinate (TOPROL-XL) 25 MG 24 hr tablet Take 1 tablet (25 mg total) by mouth daily. MUST KEEP APPOINTMENT 12/24/18 WITH DR Sw[place] FOR FUTURE REFILLS 90 tablet 3  . nitroGLYCERIN (NITROSTAT) 0.4 MG SL tablet Place 1 tablet (0.4 mg total) under  the tongue every 5 (five) minutes as needed for chest pain. 90 tablet 3  . tadalafil (ADCIRCA/CIALIS) 20 MG tablet Take 1 tablet (20 mg total) by mouth every 3 (three) days as needed for erectile dysfunction. 10 tablet 0  . levofloxacin (LEVAQUIN) 500 MG tablet Take 1 tablet (500 mg total) by mouth daily. 10 tablet 0   No current facility-administered medications for this visit.     Objective:  BP 120/82   Pulse 70   Temp (!) 97.5 F (36.4 C) (Temporal)   Ht 5\' 7"  (1.702 m)   Wt 236 lb 6.4 oz (107.2 kg)   SpO2 97%   BMI 37.03 kg/m  Gen: NAD, resting comfortably CV: RRR  Lungs: nonlabored, normal respiratory rate Abdomen: soft/nondistended GU: patient with normal left testicular exam.  On right has some mild to moderate tenderness over the epididymis.  No obvious mass.     Assessment and Plan    # Testicular pain  On right  S: Patient started with a very mild right testicular discomfort yesterday-started out about a 1 out of 10 pain.  He palpated the area and felt like he may have felt a knot-when he palpated this area the pain increased to a 4 5 out of 10.  Since that time he has decided not to palpated anymore due to worsening pain.  His pain has come back down to  around a 2 or 3 out of 10.  He has no penile discharge, polyuria, increased nocturia, fever, chills, nausea, vomiting.  He has never had similar testicular discomfort.  History from nursing staff:" Testicle Pain The patient's primary symptoms include testicular pain. The patient's pertinent negatives include no genital injury, genital itching, genital lesions, pelvic pain, penile discharge, penile pain, priapism or scrotal swelling. This is a new problem. The current episode started yesterday. The problem occurs constantly. The problem has been unchanged. The pain is mild. The testicular pain affects the right testicle. The symptoms are aggravated by tactile pressure. He has tried nothing for the symptoms. The treatment  provided no relief. He is sexually active.  Patient declines any urinary symptoms, changes in bowel movements, abdominal pain or flank pain "  A/P: from avs "I strongly suspect this is an infection/inflammation of the epididymis called epididymitis-see below.  I am going to treat you with Levaquin for 10 days-I would have something on your stomach when you take this.  If you have new or worsening symptoms please let us know immediately. " -stat ultrasound to rule out torsion/mass- though I doubt -if this is epididymitis-the fact patient has underlying BPH likely contributing.  He is monogamous with his wife and we opted not to do STD testing-he is competent and no extramarital issues  #Coronary artery disease. #Erectile dysfunctione S: Patient is compliant with aspirin and atorvastatin. Also on metoprolol 25 mg XR.  He had a stent placed in 2016.  He needs a refill on his nitroglycerin today- has not used in 3 years thankfully. No chest pain or shortness of breath.   Patient also has history of erectile dysfunction-he knows not to use nitroglycerin if has taken Cialis within 72 hours. has not needed this recently thankfully- has a few left that he can use.  A/P: CAD asymptomatic- continue statin and aspirin  ED- glad he is doing well lately- precautions as above  Recommended follow up: Return for as needed for new or worsening symptoms. Otherwise 6 month follow up  Lab/Order associations:   ICD-10-CM   1. Testicular discomfort  N50.819   2. CAD S/P percutaneous coronary angioplasty  I25.10    Z98.61   3. Erectile dysfunction, unspecified erectile dysfunction type  N52.9   4. Right testicular pain  N50.811 US SCROTUM W/DOPPLER    POCT Urinalysis Dipstick (Automated)    Urine Culture    Meds ordered this encounter  Medications  . nitroGLYCERIN (NITROSTAT) 0.4 MG SL tablet    Sig: Place 1 tablet (0.4 mg total) under the tongue every 5 (five) minutes as needed for chest pain.     Dispense:  90 tablet    Refill:  3  . levofloxacin (LEVAQUIN) 500 MG tablet    Sig: Take 1 tablet (500 mg total) by mouth daily.    Dispense:  10 tablet    Refill:  0     Return precautions advised.  Garret Reddish, MD

## 2019-10-10 NOTE — Addendum Note (Signed)
Addended by: Young Berry T on: 10/10/2019 02:47 PM   Modules accepted: Orders

## 2019-10-10 NOTE — Patient Instructions (Addendum)
Sit tight in the room we are going to get she scheduled for that ultrasound of the scrotum/testicles   Please stop by lab before you go- just for urine If you do not have mychart- we will call you about results within 5 business days of [place] receiving them.  If you have mychart- we will send your results within 3 business days of [place] receiving them.  If abnormal or we want to clarify a result, we will call or mychart you to make sure you receive the message.  If you have questions or concerns or don't hear within 5 business days, please send [place] a message or call us.    I strongly suspect this is an infection/inflammation of the epididymis called epididymitis-see below.  I am going to treat you with Levaquin for 10 days-I would have something on your stomach when you take this.  If you have new or worsening symptoms please let us know immediately.  Recommended follow up: Return for as needed for new or worsening symptoms.   Epididymitis  Epididymitis is swelling (inflammation) or infection of the epididymis. The epididymis is a cord-like structure that is located along the top and back part of the testicle. It collects and stores sperm from the testicle. This condition can also cause pain and swelling of the testicle and scrotum. Symptoms usually start suddenly (acute epididymitis). Sometimes epididymitis starts gradually and lasts for a while (chronic epididymitis). This type may be harder to treat. What are the causes? In men ages 16-40, this condition is usually caused by a bacterial infection or a sexually transmitted disease (STD), such as:  Gonorrhea.  Chlamydia. In men 41 and older who do not have anal sex, this condition is usually caused by bacteria from a blockage or from abnormalities in the urinary system. These can result from:  Having a tube placed into the bladder (urinary catheter).  Having an enlarged or inflamed prostate gland.  Having recently had urinary tract  surgery.  Having a problem with a backward flow of urine (retrograde). In men who have a condition that weakens the body's defense system (immune system), such as HIV, this condition can be caused by:  Other bacteria, including tuberculosis and syphilis.  Viruses.  Fungi. Sometimes this condition occurs without infection. This may happen because of trauma or repetitive activities such as sports. What increases the risk? You are more likely to develop this condition if you have:  Unprotected sex with more than one partner.  Anal sex.  Recently had surgery.  A urinary catheter.  Urinary problems.  A suppressed immune system. What are the signs or symptoms? This condition usually begins suddenly with chills, fever, and pain behind the scrotum and in the testicle. Other symptoms include:  Swelling of the scrotum, testicle, or both.  Pain when ejaculating or urinating.  Pain in the back or abdomen.  Nausea.  Itching and discharge from the penis.  A frequent need to pass urine.  Redness, increased warmth, and tenderness of the scrotum. How is this diagnosed? Your health care provider can diagnose this condition based on your symptoms and medical history. Your health care provider will also do a physical exam to ask about your symptoms and check your scrotum and testicle for swelling, pain, and redness. You may also have other tests, including:  Examination of discharge from the penis.  Urine tests for infections, such as STDs.  Ultrasound test for blood flow and inflammation. Your health care provider may test you for  other STDs, including HIV. How is this treated? Treatment for this condition depends on the cause. If your condition is caused by a bacterial infection, oral antibiotic medicine may be prescribed. If the bacterial infection has spread to your blood, you may need to receive IV antibiotics. For both bacterial and nonbacterial epididymitis, you may be treated  with:  Rest.  Elevation of the scrotum.  Pain medicines.  Anti-inflammatory medicines. Surgery may be needed to treat:  Bacterial epididymitis that causes pus to build up in the scrotum (abscess).  Chronic epididymitis that has not responded to other treatments. Follow these instructions at home: Medicines  Take over-the-counter and prescription medicines only as told by your health care provider.  If you were prescribed an antibiotic medicine, take it as told by your health care provider. Do not stop taking the antibiotic even if your condition improves. Sexual activity  If your epididymitis was caused by an STD, avoid sexual activity until your treatment is complete.  Inform your sexual partner or partners if you test positive for an STD. They may need to be treated. Do not engage in sexual activity with your partner or partners until their treatment is completed. Managing pain and swelling   If directed, elevate your scrotum and apply ice. ? Put ice in a plastic bag. ? Place a small towel or pillow between your legs. ? Rest your scrotum on the pillow or towel. ? Place another towel between your skin and the plastic bag. ? Leave the ice on for 20 minutes, 2-3 times a day.  Try taking a sitz bath to help with discomfort. This is a warm water bath that is taken while you are sitting down. The water should only come up to your hips and should cover your buttocks. Do this 3-4 times per day or as told by your health care provider.  Keep your scrotum elevated and supported while resting. Ask your health care provider if you should wear a scrotal support, such as a jockstrap. Wear it as told by your health care provider. General instructions  Return to your normal activities as told by your health care provider. Ask your health care provider what activities are safe for you.  Drink enough fluid to keep your urine pale yellow.  Keep all follow-up visits as told by your health  care provider. This is important. Contact a health care provider if:  You have a fever.  Your pain medicine is not helping.  Your pain is getting worse.  Your symptoms do not improve within 3 days. Summary  Epididymitis is swelling (inflammation) or infection of the epididymis. This condition can also cause pain and swelling of the testicle and scrotum.  Treatment for this condition depends on the cause. If your condition is caused by a bacterial infection, oral antibiotic medicine may be prescribed.  Inform your sexual partner or partners if you test positive for an STD. They may need to be treated. Do not engage in sexual activity with your partner or partners until their treatment is completed.  Contact a health care provider if your symptoms do not improve within 3 days. This information is not intended to replace advice given to you by your health care provider. Make sure you discuss any questions you have with your health care provider. Document Revised: 04/16/2018 Document Reviewed: 04/17/2018 Elsevier Patient Education  2020 [employer].

## 2019-10-10 NOTE — Progress Notes (Signed)
Notes from Dr. Nils Pyle do have hydroceles on both sides of your scrotum-this is not likely the cause of your pain. This is basically a fluid-filled sac in the scrotum and is not dangerous. They did not see epididymitis/inflammation of the epididymis-do not take the Levaquin  They did see a cyst at the head of the epididymis. Since you are having pain I would like to get urology's opinion-team please place urgent referral to urology. It can take a while to get into urology to be honest but hopefully we can get their opinion soon. Okay to take Tylenol for pain in the meantime. My suspicion is that they would simply monitor this and I also suspect your pain will slightly improve with time but I would like to get their opinion

## 2019-10-11 ENCOUNTER — Other Ambulatory Visit: Payer: Self-pay

## 2019-10-11 DIAGNOSIS — N503 Cyst of epididymis: Secondary | ICD-10-CM

## 2019-10-11 LAB — URINE CULTURE
MICRO NUMBER:: 10368078
SPECIMEN QUALITY:: ADEQUATE

## 2019-10-11 NOTE — Progress Notes (Signed)
Done

## 2019-10-31 ENCOUNTER — Encounter: Payer: Self-pay | Admitting: Family Medicine

## 2020-02-16 NOTE — Progress Notes (Signed)
Cardiology Office Note   Date:  02/20/2020   ID:  Luke Rice, DOB 08/16/1960, MRN [id number]  PCP:  Shelva Majestic, MD  Cardiologist:  Dr. Swaziland   History of Present Illness: Luke Rice is a 59 y.o. male with a history of CAD s/p PTCA/DES to prox RCA (09/2014), ischemic cardiomyopathy EF 45, HLD, hypothyroidism, chronic RBBB, morbid obesity and psoriasis who presents for follow up.  He presented in 2016 with symptoms of angina.  Nuc results were abnormal with  reversibility along the inferolateral wall near the apex and mid segment, findings concerning for stress-induced ischemia, mild diffuse HK, EF 44%.  Cardiac cath 10/20/14 which revealed: 100% occlusion of the proximal RCA with collaterals. The distal LCx was also occluded. There was a 50-60% mid LAD stenosis with normal FFR. He  underwent DES to RCA.  Medical therapy was recommended for residual CAD in PDA/PLOM/ and distal LCx OM. He had a follow up stress Myoview in Dec 2016 which showed a small area of ischemia in the basal inferior wall c/w LCx occlusion. Perfusion was markedly improved from prior study. No ischemia in LAD territory. EF returned to normal on follow up Echo and Myoview.  He was seen there in September 2017 and had an ETT and Echo done. The Echo showed normal LV function with EF 59%. Normal valves. On ETT he reached 10 mets with no angina and no ST changes. Repeat ETT in August 2020 was normal.   On follow up today he is feeling  well except for muscle aches in the last year. Started with plantar faciitis. Later some low back issues. Does note more muscle aches. Is working with [facility] and doing more stretching now. He denies any chest pain or SOB.      Past Medical History:  Diagnosis Date  . BPH (benign prostatic hyperplasia)    [place], not currently as bothresome  . Coronary artery disease    a.  LHC 10/20/14 s/p PTCA/DES to prox RCA, FFR of mLAD (found to be  not hemodynamically significant), also occluded distal LCx prior to terminal small OM branch with L-L collaterals. Residual dz for med rx.  . Dyslipidemia   . Hyperglycemia    a. A1C 6.1 in 09/2014.  Marland Kitchen Hypothyroidism   . Ischemic cardiomyopathy    a. EF 45% by cath 09/2014.  . Obesity   . Plantar fasciitis of left foot   . Psoriasis   . RBBB   . Renal cyst 01/22/2014   45mm left kidney. Bozniak Class I in discussion with [facility]. No further follow up.      Past Surgical History:  Procedure Laterality Date  . CARDIAC CATHETERIZATION N/A 10/20/2014   Procedure: CORONARY STENT INTERVENTION;  Surgeon: Benigno Check M Swaziland, MD;  Location: [facility] CATH LAB;  Service: Cardiovascular;  Laterality: N/A;  . COLONOSCOPY     30s. concern for diverticulitis.   . CORONARY ANGIOPLASTY WITH STENT PLACEMENT  10/20/2014   "1"  . FRACTIONAL FLOW RESERVE WIRE  10/20/2014   Procedure: FRACTIONAL FLOW RESERVE WIRE;  Surgeon: Danta Baumgardner M Swaziland, MD;  Location: [facility] CATH LAB;  Service: Cardiovascular;;  . LEFT HEART CATHETERIZATION WITH CORONARY ANGIOGRAM N/A 10/20/2014   Procedure: LEFT HEART CATHETERIZATION WITH CORONARY ANGIOGRAM;  Surgeon: Janae Bonser M Swaziland, MD;  Location: [facility] CATH LAB;  Service: Cardiovascular;  Laterality: N/A;     Current Outpatient Medications  Medication Sig Dispense Refill  . aspirin 81  MG tablet Take 81 mg by mouth daily.    Marland Kitchen atorvastatin (LIPITOR) 80 MG tablet Take 1 tablet (80 mg total) by mouth every evening. MUST KEEP APPOINTMENT 12/24/18 WITH DR Swaziland FOR FUTURE REFILLS 90 tablet 3  . levothyroxine (SYNTHROID) 75 MCG tablet TAKE 1 TABLET BY MOUTH EVERY DAY 90 tablet 1  . metoprolol succinate (TOPROL-XL) 25 MG 24 hr tablet Take 1 tablet (25 mg total) by mouth daily. MUST KEEP APPOINTMENT 12/24/18 WITH DR Swaziland FOR FUTURE REFILLS 90 tablet 3  . ALPRAZolam (XANAX) 1 MG tablet Take 0.5-1 tablets (0.5-1 mg total) by mouth at bedtime as needed for sleep (with travel). (Patient not  taking: Reported on 02/20/2020) 30 tablet 1  . nitroGLYCERIN (NITROSTAT) 0.4 MG SL tablet Place 1 tablet (0.4 mg total) under the tongue every 5 (five) minutes as needed for chest pain. (Patient not taking: Reported on 02/20/2020) 90 tablet 3  . tadalafil (ADCIRCA/CIALIS) 20 MG tablet Take 1 tablet (20 mg total) by mouth every 3 (three) days as needed for erectile dysfunction. (Patient not taking: Reported on 02/20/2020) 10 tablet 0   No current facility-administered medications for this visit.    Allergies:   Patient has no known allergies.    Social History:  The patient  reports that he has never smoked. He has never used smokeless tobacco. He reports current alcohol use of about 4.0 standard drinks of alcohol per week. He reports that he does not use drugs.   Family History:  The patient's family history includes Bipolar disorder in his brother; Diabetes in his brother; Hypothyroidism in his mother and sister; Lung cancer in an other family member.    ROS:  Please see the history of present illness.   Otherwise, review of systems are positive for none.   All other systems are reviewed and negative.    PHYSICAL EXAM: VS:  BP 122/82   Pulse 73   Ht 5\' 7"  (1.702 m)   Wt 240 lb 12.8 oz (109.2 kg)   SpO2 100%   BMI 37.71 kg/m  , BMI Body mass index is 37.71 kg/m. GENERAL:  Well appearing, overweight WM in NAD HEENT:  PERRL, EOMI, sclera are clear. Oropharynx is clear. NECK:  No jugular venous distention, carotid upstroke brisk and symmetric, no bruits, no thyromegaly or adenopathy LUNGS:  Clear to auscultation bilaterally CHEST:  Unremarkable HEART:  RRR,  PMI not displaced or sustained,S1 and S2 within normal limits, no S3, no S4: no clicks, no rubs, gr 2/6 SEM radiating to carotids bilaterally.  ABD:  Soft, nontender. BS +, no masses or bruits. No hepatomegaly, no splenomegaly EXT:  2 + pulses throughout, no edema, no cyanosis no clubbing SKIN:  Warm and dry.  No rashes NEURO:   Alert and oriented x 3. Cranial nerves II through XII intact. PSYCH:  Cognitively intact   EKG:  EKG is  ordered today. NSR rate 73. LAD RBBB. I have personally reviewed and interpreted this study.   Recent Labs: 07/11/2019: ALT 29; BUN 17; Creatinine, Ser 1.14; Hemoglobin 14.2; Platelets 279.0; Potassium 4.3; Sodium 140; TSH 4.37    Lipid Panel    Component Value Date/Time   CHOL 126 07/11/2019 1604   CHOL 116 02/07/2017 1020   TRIG 139.0 07/11/2019 1604   HDL 44.50 07/11/2019 1604   HDL 40 02/07/2017 1020   CHOLHDL 3 07/11/2019 1604   VLDL 27.8 07/11/2019 1604   LDLCALC 54 07/11/2019 1604   LDLCALC 61 02/07/2017 1020  LDLDIRECT 61 11/24/2017 1544     Lab Results  Component Value Date   WBC 7.4 07/11/2019   HGB 14.2 07/11/2019   HCT 43.4 07/11/2019   PLT 279.0 07/11/2019   GLUCOSE 97 07/11/2019   CHOL 126 07/11/2019   TRIG 139.0 07/11/2019   HDL 44.50 07/11/2019   LDLDIRECT 61 11/24/2017   LDLCALC 54 07/11/2019   ALT 29 07/11/2019   AST 21 07/11/2019   NA 140 07/11/2019   K 4.3 07/11/2019   CL 103 07/11/2019   CREATININE 1.14 07/11/2019   BUN 17 07/11/2019   CO2 29 07/11/2019   TSH 4.37 07/11/2019   PSA 0.84 07/11/2019   INR 1.01 10/18/2014   HGBA1C 6.1 07/11/2019     Wt Readings from Last 3 Encounters:  02/20/20 240 lb 12.8 oz (109.2 kg)  10/10/19 236 lb 6.4 oz (107.2 kg)  07/11/19 234 lb 3.2 oz (106.2 kg)      Other studies Reviewed: Additional studies/ records that were reviewed today include: none  ETT 02/12/19: Study Highlights   The patient walked for 9 minutes and 10 seconds on a standard Bruce protocol treadmill test  He achieved a peak heart rate of 164 which is 92% predicted maximal heart rate. He had a right bundle branch block at baseline.  The blood pressure response to exercise was normal.  There were no ST or T wave changes to suggest ischemia at peak exercise.  This is interpreted as a negative stress test. There is no evidence  of ischemia.     ASSESSMENT AND PLAN:   1. CAD-  s/p PTCA/DES to prox RCA (09/2014) Myoview in Dec 2016 as noted above. Normal ETT in August 2020. He is asymptomatic.  -- Continue ASA, statin and BB.  -- Encourage more aerobic activity and weight loss  2. Ischemic CM- EF has recovered. Normal Echo in September 2017  3. HLD- Continue statin with excellent control. Will reduce dose of lipitor to 40 mg daily to see if we can help his muscle aches. Repeat  Lab 3 months.  4. Mild aortic stenosis. Last Echo 2017. Will plan to repeat within the next year.    Current medicines are reviewed at length with the patient today.  The patient does not have concerns regarding medicines.  The following changes have been made:  Reduce lipitor to 40 mg daily.  Labs/ tests ordered today include:   No orders of the defined types were placed in this encounter.    Disposition:   FU with Dr. Swaziland in 6 months   Signed, Haward Pope Swaziland, MD  02/20/2020 2:38 PM

## 2020-02-20 ENCOUNTER — Other Ambulatory Visit: Payer: Self-pay

## 2020-02-20 ENCOUNTER — Encounter: Payer: Self-pay | Admitting: Cardiology

## 2020-02-20 ENCOUNTER — Ambulatory Visit (INDEPENDENT_AMBULATORY_CARE_PROVIDER_SITE_OTHER): Payer: BC Managed Care – PPO | Admitting: Cardiology

## 2020-02-20 VITALS — BP 122/82 | HR 73 | Ht 67.0 in | Wt 240.8 lb

## 2020-02-20 DIAGNOSIS — Z9861 Coronary angioplasty status: Secondary | ICD-10-CM

## 2020-02-20 DIAGNOSIS — I25118 Atherosclerotic heart disease of native coronary artery with other forms of angina pectoris: Secondary | ICD-10-CM | POA: Diagnosis not present

## 2020-02-20 DIAGNOSIS — E785 Hyperlipidemia, unspecified: Secondary | ICD-10-CM | POA: Diagnosis not present

## 2020-02-20 DIAGNOSIS — I2510 Atherosclerotic heart disease of native coronary artery without angina pectoris: Secondary | ICD-10-CM

## 2020-02-20 MED ORDER — ATORVASTATIN CALCIUM 80 MG PO TABS: 80.0000 mg | ORAL_TABLET | Freq: Every evening | ORAL | 3 refills | Status: DC

## 2020-02-20 NOTE — Patient Instructions (Signed)
Reduce lipitor to 40 mg daily  Repeat lab in 3 months

## 2020-02-20 NOTE — Addendum Note (Signed)
Addended by: Neoma Laming on: 02/20/2020 02:42 PM   Modules accepted: Orders

## 2020-03-05 ENCOUNTER — Other Ambulatory Visit: Payer: Self-pay | Admitting: Family Medicine

## 2020-03-06 ENCOUNTER — Other Ambulatory Visit: Payer: Self-pay | Admitting: Cardiology

## 2020-03-30 ENCOUNTER — Other Ambulatory Visit: Payer: Self-pay | Admitting: Family Medicine

## 2020-03-31 ENCOUNTER — Other Ambulatory Visit: Payer: Self-pay | Admitting: *Deleted

## 2020-03-31 DIAGNOSIS — L570 Actinic keratosis: Secondary | ICD-10-CM | POA: Diagnosis not present

## 2020-03-31 MED ORDER — TADALAFIL 20 MG PO TABS: ORAL_TABLET | ORAL | 1 refills | Status: DC

## 2020-04-07 ENCOUNTER — Telehealth: Payer: Self-pay

## 2020-04-07 ENCOUNTER — Telehealth: Payer: Self-pay | Admitting: Cardiology

## 2020-04-07 DIAGNOSIS — R110 Nausea: Secondary | ICD-10-CM | POA: Diagnosis not present

## 2020-04-07 DIAGNOSIS — R001 Bradycardia, unspecified: Secondary | ICD-10-CM | POA: Diagnosis not present

## 2020-04-07 DIAGNOSIS — Z5329 Procedure and treatment not carried out because of patient's decision for other reasons: Secondary | ICD-10-CM | POA: Diagnosis not present

## 2020-04-07 DIAGNOSIS — M25512 Pain in left shoulder: Secondary | ICD-10-CM | POA: Diagnosis not present

## 2020-04-07 DIAGNOSIS — R61 Generalized hyperhidrosis: Secondary | ICD-10-CM | POA: Diagnosis not present

## 2020-04-07 DIAGNOSIS — I4510 Unspecified right bundle-branch block: Secondary | ICD-10-CM | POA: Diagnosis not present

## 2020-04-07 NOTE — Telephone Encounter (Signed)
Received call from registration patient walked in requesting EKG for chest pain.    Spoke to patient-patient states he has experienced left shoulder pain for ~2 weeks.   He thought this was musculoskeletal pain but is not improving.   Denies radiation to chest, back, neck, arm, etc.  Denies SOB.  Reports intermittent nausea spells with pain, also diaphoresis.   Reports pain gets better when he is lying flat, pain increases when he stands up.   Not worse with exertion.   Took tylenol and naproxen without relief.  Took ibuprofen yesterday and today with some improvement.   Pain does not get worse or better with palpation.   States he called his PCP and they advised him to go to UC to be evaluated.  He has appt with Dr. Durene Cal on 10/14.   He states he is going to UC to be evaluated, advised to keep appt with PCP on Thursday and if need can schedule follow up with Dr. Swaziland or APP.  Patient aware and verbalized understanding.  No distress noted on phone.

## 2020-04-07 NOTE — Telephone Encounter (Signed)
New message:    Patient calling stating that he is having some shoulder pain and some pain in chest area. Patient would like to speak with a nurse.

## 2020-04-07 NOTE — Telephone Encounter (Signed)
Pt would like to be called by Dr. Erasmo Leventhal assistant about a few questions that he has in regards to his shoulder blade pain

## 2020-04-07 NOTE — Telephone Encounter (Signed)
Called and spoke with pt and he c/o left shoulder pain that's dull and steady for about a week. He states he has had spurts where it breaks him out in a sweat. Pt has an appointment with Dr. Durene Cal on 04/09/20, I advised pt to seek care at [facility] immediately to get evaluated. He states ibuprofen has been helping but today It hasn't touched the pain. Pt states he is going to reach out to his cardiologist to see if they can see him today and if not he will go the UC to be evaluated. I stressed the importance of him not putting this off long to seek care immediately, pt voiced understanding.

## 2020-04-07 NOTE — Telephone Encounter (Signed)
LMTCB

## 2020-04-07 NOTE — Telephone Encounter (Signed)
Luke Rice is returning Hayley's call.

## 2020-04-07 NOTE — Telephone Encounter (Signed)
Agree with this- at least needs EKG- I think hes being seen at [facility]

## 2020-04-08 DIAGNOSIS — I4510 Unspecified right bundle-branch block: Secondary | ICD-10-CM | POA: Diagnosis not present

## 2020-04-08 DIAGNOSIS — R001 Bradycardia, unspecified: Secondary | ICD-10-CM | POA: Diagnosis not present

## 2020-04-08 NOTE — Progress Notes (Signed)
Phone [phone] In person visit   Subjective:   Luke Rice is a 59 y.o. year old very pleasant male patient who presents for/with See problem oriented charting Chief Complaint  Patient presents with  . Left Shoulder Blade Injury  . Chest Pain   This visit occurred during the SARS-CoV-2 public health emergency.  Safety protocols were in place, including screening questions prior to the visit, additional usage of staff PPE, and extensive cleaning of exam room while observing appropriate contact time as indicated for disinfecting solutions.   Past Medical History-  Patient Active Problem List   Diagnosis Date Noted  . CAD S/P percutaneous coronary angioplasty 10/21/2014    Priority: High  . Psoriasis     Priority: High  . Insomnia 02/10/2015    Priority: Medium  . Dyslipidemia 10/21/2014    Priority: Medium  . Hypothyroidism     Priority: Medium  . Erectile dysfunction 10/10/2019    Priority: Low  . RBBB     Priority: Low  . Obesity 01/22/2014    Priority: Low  . Migraine 01/22/2014    Priority: Low  . Plantar fasciitis of left foot     Priority: Low  . BPH (benign prostatic hyperplasia)     Priority: Low    Medications- reviewed and updated Current Outpatient Medications  Medication Sig Dispense Refill  . aspirin 81 MG tablet Take 81 mg by mouth daily.    Marland Kitchen atorvastatin (LIPITOR) 80 MG tablet Take 1 tablet (80 mg total) by mouth every evening. MUST KEEP APPOINTMENT 12/24/18 WITH DR Swaziland FOR FUTURE REFILLS 90 tablet 3  . levothyroxine (SYNTHROID) 75 MCG tablet TAKE 1 TABLET BY MOUTH EVERY DAY 90 tablet 1  . metoprolol succinate (TOPROL-XL) 25 MG 24 hr tablet TAKE 1 TABLET BY MOUTH DAILY. 90 tablet 4  . tadalafil (CIALIS) 20 MG tablet TAKE 1 TABLET BY MOUTH EVERY 3 DAYS AS NEEDED FOR ERECTILE DYSFUNCTION 6 tablet 1  . ALPRAZolam (XANAX) 1 MG tablet Take 0.5-1 tablets (0.5-1 mg total) by mouth at bedtime as needed for sleep (with travel). (Patient not taking:  Reported on 02/20/2020) 30 tablet 1  . cyclobenzaprine (FLEXERIL) 5 MG tablet Take 1 tablet (5 mg total) by mouth 3 (three) times daily as needed for muscle spasms (do not drive for 8 hours after use). 30 tablet 0  . nitroGLYCERIN (NITROSTAT) 0.4 MG SL tablet Place 1 tablet (0.4 mg total) under the tongue every 5 (five) minutes as needed for chest pain. 30 tablet 3   No current facility-administered medications for this visit.     Objective:  BP 126/90   Pulse 78   Temp (!) 97.2 F (36.2 C) (Temporal)   Resp 18   Ht 5\' 7"  (1.702 m)   Wt 235 lb 12.8 oz (107 kg)   SpO2 96%   BMI 36.93 kg/m  Gen: NAD, resting comfortably CV: RRR no murmurs rubs or gallops Lungs: CTAB no crackles, wheeze, rhonchi MSK: When patient horizontal flexes his left arm across his chest-patient reports some discomfort-in this position with palpation under the scapula pain worsens.  Patient had acute worsening the pain after I did this maneuver-laying down seem to ease his pain off.  Did get some radiation of pain into the chest with this.  Also got some discomfort with overhead shoulder stretch  EKG: sinus rhythm with rate 75, left axis, normal intervals, no hypertrophy, no st or t wave changes     Assessment and Plan  Shoulder Blade  Pain/scapular pain/Chest Tightness  S:Paitent mentioned that he went deep sea fishing (some next dayjarring on boat but no obvious pain) 3 weeks ago and woke up the next day with some soreness/tightness in left shoulder blade area/scapular area. Got a massage while out of town but only gave temporary relief- did not find a particular spot. When he got home [facility] and she felt like there was a deep spot under shoulder blade but could not quite get to the spot. Pain up to 7/10. Can move shoulder without worsening pain but some pain with crossing left arm across his body.  He also reports pain with looking at his cell phone when in bed-he is left-handed he  typically holds in the left hand.Does get some relief with heating pad. Laying down helps - sitting up and even something simple like driving or sitting in desk or lounger bothers him.  Pain seems to be more significant when he wakes up in the morning and gets moving.He has taken otc tylenol and aleve with no relief.    Known CAD with stent 2016. Remains on aspirin and atorvastatin- reports down to 40mg  as of last month with muscle stiffness on statins. When the pain is severe he gets radiation into his left chest at times.  He also can get some sweating/diaphoresis as well as nausea when the pain is severe he tried nitroglycerin x2 over last few nights with no help.  He was seen at urgent care and was advised to more emergent evaluation but they were not sure of the source of his pain.  Patient did not seek emergent care and instead chose to keep follow-up today with me.No shortness of breath. No cough or congestion.   Has not exercised to know if that makes it worse. Movement and sitting up in general bothers it. Not worse walking down hall vs. Sitting. Possible surges with activity but not sure- not worse with flight of stairs (and has used stairs).  Certainly had a surge of pain today with 2 separate positions-palpation of area with arm horizontally flexed across body as well as with holding a cell phone in the left hand while laying down  A/P: 59 year old male with CAD reporting pain underneath left scapula not clearly worsened by exertion, not responding to nitroglycerin.  Occasionally gets more severe pain which can cause some radiation of pain into the chest as well as diaphoresis and slight nausea.  Considering reproduction of patient's pain beneath the left scapula with movements as noted under exam-strongly suspect this is musculoskeletal.  Patient would like to try muscle relaxant and I think that is reasonable.  I also did an urgent referral to sports medicine due to his degree of discomfort up  to 7 out of 10 pain.  I told patient I would be willing to call in tramadol if Flexeril does not help his pain overnight.  He will send me a MyChart message.  I think the sweating episodes he has are related to pain from this area.  There may be an anxiety component to the chest pain reported though he is very clear this is a minimal symptom compared to the pain under the left scapula  With that being said patient obviously has coronary artery disease.  His EKG is reassuring today but we discussed this does not rule out cardiac involvement.  No clear exertional chest pain component but I did encourage him to follow-up with cardiology.  I also refill nitroglycerin  as he thinks prescription he tried may be expired.  We discussed if pain does improve with nitroglycerin this would increase the likelihood that this could be cardiac and would seek care more urgently though I do not think this is clearly unstable angina.  With possible chest pain we did opt to check CBC and CMP to look for any obvious abnormalities-anemia could cause chest pain potentially  Blood pressure minimally elevated today-I suspect that is related to pain-recheck next visit  Recommended follow up: As needed for acute concerns Future Appointments  Date Time Provider Department Center  08/24/2020  1:20 PM Swaziland, Peter M, MD [facility]    Lab/Order associations:   ICD-10-CM   1. Pain of left scapula  M89.8X1 Ambulatory referral to Sports Medicine  2. Chest tightness  R07.89 EKG 12-Lead    CBC With Differential/Platelet    COMPLETE METABOLIC PANEL WITH GFR    CBC With Differential/Platelet    COMPLETE METABOLIC PANEL WITH GFR    Meds ordered this encounter  Medications  . nitroGLYCERIN (NITROSTAT) 0.4 MG SL tablet    Sig: Place 1 tablet (0.4 mg total) under the tongue every 5 (five) minutes as needed for chest pain.    Dispense:  30 tablet    Refill:  3  . cyclobenzaprine (FLEXERIL) 5 MG tablet    Sig: Take 1  tablet (5 mg total) by mouth 3 (three) times daily as needed for muscle spasms (do not drive for 8 hours after use).    Dispense:  30 tablet    Refill:  0    Time Spent: 41 minutes of total time (3:47 PM-4:23 PM, 5:36 PM-5:41 PM) was spent on the date of the encounter performing the following actions: chart review prior to seeing the patient, obtaining history, performing a medically necessary exam, counseling on the treatment plan, placing orders, and documenting in our EHR.   Return precautions advised.  Tana Conch, MD

## 2020-04-08 NOTE — Patient Instructions (Addendum)
Health Maintenance Due  Topic Date Due  . INFLUENZA VACCINE hold off for now 01/26/2020   Trial muscle relaxant before bed- do not drive for 8 hours after taking.   Try nitroglycerin non expired- if this helps could be a sign this is cardiac. I still want you to schedule follow up with cardiology but I am leaning toward muscular  Referral to sports medicine- try to get you in tomorrow. Sit tight until e know if scheduled

## 2020-04-09 ENCOUNTER — Encounter: Payer: Self-pay | Admitting: Family Medicine

## 2020-04-09 ENCOUNTER — Ambulatory Visit (INDEPENDENT_AMBULATORY_CARE_PROVIDER_SITE_OTHER): Payer: BC Managed Care – PPO | Admitting: Family Medicine

## 2020-04-09 ENCOUNTER — Other Ambulatory Visit: Payer: Self-pay

## 2020-04-09 ENCOUNTER — Ambulatory Visit: Payer: BC Managed Care – PPO | Admitting: Medical

## 2020-04-09 VITALS — BP 126/90 | HR 78 | Temp 97.2°F | Resp 18 | Ht 67.0 in | Wt 235.8 lb

## 2020-04-09 DIAGNOSIS — M898X1 Other specified disorders of bone, shoulder: Secondary | ICD-10-CM | POA: Diagnosis not present

## 2020-04-09 DIAGNOSIS — R0789 Other chest pain: Secondary | ICD-10-CM | POA: Diagnosis not present

## 2020-04-09 MED ORDER — NITROGLYCERIN 0.4 MG SL SUBL: 0.4000 mg | SUBLINGUAL_TABLET | SUBLINGUAL | 3 refills | Status: DC | PRN

## 2020-04-09 MED ORDER — CYCLOBENZAPRINE HCL 5 MG PO TABS: 5.0000 mg | ORAL_TABLET | Freq: Three times a day (TID) | ORAL | 0 refills | Status: DC | PRN

## 2020-04-10 ENCOUNTER — Encounter: Payer: Self-pay | Admitting: Family Medicine

## 2020-04-10 ENCOUNTER — Ambulatory Visit: Payer: Self-pay

## 2020-04-10 ENCOUNTER — Ambulatory Visit (INDEPENDENT_AMBULATORY_CARE_PROVIDER_SITE_OTHER): Payer: BC Managed Care – PPO | Admitting: Family Medicine

## 2020-04-10 VITALS — BP 130/84 | HR 72 | Ht 67.0 in | Wt 234.0 lb

## 2020-04-10 DIAGNOSIS — M25512 Pain in left shoulder: Secondary | ICD-10-CM | POA: Diagnosis not present

## 2020-04-10 DIAGNOSIS — M898X1 Other specified disorders of bone, shoulder: Secondary | ICD-10-CM

## 2020-04-10 LAB — COMPLETE METABOLIC PANEL WITH GFR
AG Ratio: 1.7 (calc) (ref 1.0–2.5)
ALT: 29 U/L (ref 9–46)
AST: 21 U/L (ref 10–35)
Albumin: 4.3 g/dL (ref 3.6–5.1)
Alkaline phosphatase (APISO): 50 U/L (ref 35–144)
BUN: 22 mg/dL (ref 7–25)
CO2: 23 mmol/L (ref 20–32)
Calcium: 9.5 mg/dL (ref 8.6–10.3)
Chloride: 109 mmol/L (ref 98–110)
Creat: 1.24 mg/dL (ref 0.70–1.33)
GFR, Est African American: 74 mL/min/{1.73_m2} (ref >=60)
GFR, Est Non African American: 64 mL/min/{1.73_m2} (ref >=60)
Globulin: 2.5 g/dL (calc) (ref 1.9–3.7)
Glucose, Bld: 110 mg/dL — ABNORMAL HIGH (ref 65–99)
Potassium: 4.4 mmol/L (ref 3.5–5.3)
Sodium: 142 mmol/L (ref 135–146)
Total Bilirubin: 0.6 mg/dL (ref 0.2–1.2)
Total Protein: 6.8 g/dL (ref 6.1–8.1)

## 2020-04-10 LAB — CBC WITH DIFFERENTIAL/PLATELET
Absolute Monocytes: 825 cells/uL (ref 200–950)
Basophils Absolute: 44 cells/uL (ref 0–200)
Basophils Relative: 0.6 %
Eosinophils Absolute: 88 cells/uL (ref 15–500)
Eosinophils Relative: 1.2 %
HCT: 43.5 % (ref 38.5–50.0)
Hemoglobin: 14.7 g/dL (ref 13.2–17.1)
Lymphs Abs: 1796 cells/uL (ref 850–3900)
MCH: 28.7 pg (ref 27.0–33.0)
MCHC: 33.8 g/dL (ref 32.0–36.0)
MCV: 84.8 fL (ref 80.0–100.0)
MPV: 10.4 fL (ref 7.5–12.5)
Monocytes Relative: 11.3 %
Neutro Abs: 4548 cells/uL (ref 1500–7800)
Neutrophils Relative %: 62.3 %
Platelets: 292 10*3/uL (ref 140–400)
RBC: 5.13 10*6/uL (ref 4.20–5.80)
RDW: 13.3 % (ref 11.0–15.0)
Total Lymphocyte: 24.6 %
WBC: 7.3 10*3/uL (ref 3.8–10.8)

## 2020-04-10 MED ORDER — TIZANIDINE HCL 4 MG PO TABS: 4.0000 mg | ORAL_TABLET | Freq: Four times a day (QID) | ORAL | 1 refills | Status: DC | PRN

## 2020-04-10 MED ORDER — TRAMADOL HCL 50 MG PO TABS: 50.0000 mg | ORAL_TABLET | Freq: Three times a day (TID) | ORAL | 0 refills | Status: DC | PRN

## 2020-04-10 NOTE — Patient Instructions (Addendum)
Thank you for coming in today.  Plan for PT.   Use heating pad and TENS unit.   TENS UNIT: This is helpful for muscle pain and spasm.   Search and Purchase a TENS 7000 2nd edition at  www.tenspros.com or www.Amazon.com It should be less than $30.     TENS unit instructions: Do not shower or bathe with the unit on . Turn the unit off before removing electrodes or batteries . If the electrodes lose stickiness add a drop of water to the electrodes after they are disconnected from the unit and place on plastic sheet. If you continued to have difficulty, call the TENS unit company to purchase more electrodes. . Do not apply lotion on the skin area prior to use. Make sure the skin is clean and dry as this will help prolong the life of the electrodes. . After use, always check skin for unusual red areas, rash or other skin difficulties. If there are any skin problems, does not apply electrodes to the same area. . Never remove the electrodes from the unit by pulling the wires. . Do not use the TENS unit or electrodes other than as directed. . Do not change electrode placement without consultating your therapist or physician. Marland Kitchen Keep 2 fingers with between each electrode. . Wear time ratio is 2:1, on to off times.    For example on for 30 minutes off for 15 minutes and then on for 30 minutes off for 15 minutes

## 2020-04-10 NOTE — Addendum Note (Signed)
Addended by: Rodolph Bong on: 04/10/2020 01:36 PM   Modules accepted: Orders

## 2020-04-10 NOTE — Progress Notes (Signed)
Subjective:    I'm seeing this patient as a consultation for:  Dr. Durene Cal. Note will be routed back to referring provider/PCP.  CC: L scapular pain  I, Debbe Odea, am serving as a scribe for Dr. Clementeen Graham.  HPI: Pt is a 59 y/o male presenting w/ c/o L scapular pain x approximately 3 weeks that began after going deep sea fishing.  He was seen by his PCP yesterday and had an EKG and was prescribed Flexeril. States when the pain starts it feels very tight across the back.  No pain radiating down the arm.  Pain is better with laying down and worse with sitting up and standing.  Worse with arm motion.  Pain can be severe at times rated 7 out of 10.  Radiating pain: no Aggravating factors: Sitting; horizontal aDd; holding his cell phone to look at messages while supine; Treatments tried: Tylenol, Aleve; Flexeril; massage  Past medical history, Surgical history, Family history, Social history, Allergies, and medications have been entered into the medical record, reviewed.   Review of Systems: No new headache, visual changes, nausea, vomiting, diarrhea, constipation, dizziness, abdominal pain, skin rash, fevers, chills, night sweats, weight loss, swollen lymph nodes, body aches, joint swelling, muscle aches, chest pain, shortness of breath, mood changes, visual or auditory hallucinations.   Objective:    Vitals:   04/10/20 1148  BP: 130/84  Pulse: 72  SpO2: 97%   General: Well Developed, well nourished, and in no acute distress.  Neuro/Psych: Alert and oriented x3, extra-ocular muscles intact, able to move all 4 extremities, sensation grossly intact. Skin: Warm and dry, no rashes noted.  Respiratory: Not using accessory muscles, speaking in full sentences, trachea midline.  Cardiovascular: Pulses palpable, no extremity edema. Abdomen: Does not appear distended. MSK: C-spine normal-appearing nontender normal cervical motion.  Upper extremity strength reflexes and sensation are intact  distally. T-spine normal-appearing nontender midline.  Mildly tender palpation left thoracic paraspinal musculature and rhomboid. Decreased scapular rotation left compared to right with arm abduction. Strength intact to rhomboid testing. Pulses cap refill and sensation are intact distally.  Reflexes are intact and equal bilaterally.  Lab and Radiology Results Results for orders placed or performed in visit on 04/09/20 (from the past 72 hour(s))  CBC With Differential/Platelet     Status: None   Collection Time: 04/09/20  4:26 PM  Result Value Ref Range   WBC 7.3 3.8 - 10.8 Thousand/uL   RBC 5.13 4.20 - 5.80 Million/uL   Hemoglobin 14.7 13.2 - 17.1 g/dL   HCT 62.3 38 - 50 %   MCV 84.8 80.0 - 100.0 fL   MCH 28.7 27.0 - 33.0 pg   MCHC 33.8 32.0 - 36.0 g/dL   RDW 76.2 83.1 - 51.7 %   Platelets 292 140 - 400 Thousand/uL   MPV 10.4 7.5 - 12.5 fL   Neutro Abs 4,548 1,500 - 7,800 cells/uL   Lymphs Abs 1,796 850 - 3,900 cells/uL   Absolute Monocytes 825 200 - 950 cells/uL   Eosinophils Absolute 88 15.0 - 500.0 cells/uL   Basophils Absolute 44 0.0 - 200.0 cells/uL   Neutrophils Relative % 62.3 %   Total Lymphocyte 24.6 %   Monocytes Relative 11.3 %   Eosinophils Relative 1.2 %   Basophils Relative 0.6 %  COMPLETE METABOLIC PANEL WITH GFR     Status: Abnormal   Collection Time: 04/09/20  4:26 PM  Result Value Ref Range   Glucose, Bld 110 (H) 65 - 99  mg/dL    Comment: .            Fasting reference interval . For someone without known diabetes, a glucose value between 100 and 125 mg/dL is consistent with prediabetes and should be confirmed with a follow-up test. .    BUN 22 7 - 25 mg/dL   Creat 6.01 0.93 - 2.35 mg/dL    Comment: For patients >94 years of age, the reference limit for Creatinine is approximately 13% higher for people identified as African-American. .    GFR, Est Non African American 64 > OR = 60 mL/min/1.76m2   GFR, Est African American 74 > OR = 60  mL/min/1.32m2   BUN/Creatinine Ratio NOT APPLICABLE 6 - 22 (calc)   Sodium 142 135 - 146 mmol/L   Potassium 4.4 3.5 - 5.3 mmol/L   Chloride 109 98 - 110 mmol/L   CO2 23 20 - 32 mmol/L   Calcium 9.5 8.6 - 10.3 mg/dL   Total Protein 6.8 6.1 - 8.1 g/dL   Albumin 4.3 3.6 - 5.1 g/dL   Globulin 2.5 1.9 - 3.7 g/dL (calc)   AG Ratio 1.7 1.0 - 2.5 (calc)   Total Bilirubin 0.6 0.2 - 1.2 mg/dL   Alkaline phosphatase (APISO) 50 35 - 144 U/L   AST 21 10 - 35 U/L   ALT 29 9 - 46 U/L   No results found.  Impression and Recommendations:    Assessment and Plan: 59 y.o. male with left periscapular pain ongoing for about 3 weeks occurring after a fishing trip.  Very likely overuse/strain of rhomboid or periscapular musculature.  Now with spasm and dysfunction.  Plan for heating pad TENS unit physical therapy.  Additionally for the short duration will prescribe tramadol and will switch to tizanidine for muscle relaxer.  If not improving patient will notify me we will proceed with imaging and further evaluation.Marland Kitchen  PDMP reviewed during this encounter. Orders Placed This Encounter  Procedures  . Ambulatory referral to Physical Therapy    Referral Priority:   Routine    Referral Type:   Physical Medicine    Referral Reason:   Specialty Services Required    Requested Specialty:   Physical Therapy    Number of Visits Requested:   1   Meds ordered this encounter  Medications  . traMADol (ULTRAM) 50 MG tablet    Sig: Take 1 tablet (50 mg total) by mouth every 8 (eight) hours as needed for severe pain.    Dispense:  15 tablet    Refill:  0  . tiZANidine (ZANAFLEX) 4 MG tablet    Sig: Take 1 tablet (4 mg total) by mouth every 6 (six) hours as needed for muscle spasms.    Dispense:  30 tablet    Refill:  1    Discussed warning signs or symptoms. Please see discharge instructions. Patient expresses understanding.   The above documentation has been reviewed and is accurate and complete Clementeen Graham,  M.D.

## 2020-04-17 DIAGNOSIS — M25512 Pain in left shoulder: Secondary | ICD-10-CM | POA: Diagnosis not present

## 2020-04-17 DIAGNOSIS — M898X1 Other specified disorders of bone, shoulder: Secondary | ICD-10-CM | POA: Diagnosis not present

## 2020-06-08 DIAGNOSIS — Z23 Encounter for immunization: Secondary | ICD-10-CM | POA: Diagnosis not present

## 2020-08-22 NOTE — Progress Notes (Deleted)
Cardiology Office Note   Date:  08/22/2020   ID:  Luke Rice, DOB [birth year], MRN [id number]  PCP:  Shelva Majestic, MD  Cardiologist:  Dr. [place]   History of Present Illness: Luke Rice is a 60 y.o. male with a history of CAD s/p PTCA/DES to prox RCA (09/2014), ischemic cardiomyopathy EF 45, HLD, hypothyroidism, chronic RBBB, morbid obesity and psoriasis who presents for follow up.  He presented in 2016 with symptoms of angina.  Nuc results were abnormal with  reversibility along the inferolateral wall near the apex and mid segment, findings concerning for stress-induced ischemia, mild diffuse HK, EF 44%.  Cardiac cath 10/20/14 which revealed: 100% occlusion of the proximal RCA with collaterals. The distal LCx was also occluded. There was a 50-60% mid LAD stenosis with normal FFR. He  underwent DES to RCA.  Medical therapy was recommended for residual CAD in PDA/PLOM/ and distal LCx OM. He had a follow up stress Myoview in Dec 2016 which showed a small area of ischemia in the basal inferior wall c/w LCx occlusion. Perfusion was markedly improved from prior study. No ischemia in LAD territory. EF returned to normal on follow up Echo and Myoview.  He was seen there in September 2017 and had an ETT and Echo done. The Echo showed normal LV function with EF 59%. Normal valves. On ETT he reached 10 mets with no angina and no ST changes. Repeat ETT in August 2020 was normal.   On follow up today he is feeling  well except for muscle aches in the last year. Started with plantar faciitis. Later some low back issues. Does note more muscle aches. Is working with [employer] and doing more stretching now. He denies any chest pain or SOB.      Past Medical History:  Diagnosis Date  . BPH (benign prostatic hyperplasia)    [place], not currently as bothresome  . Coronary artery disease    a.  LHC 10/20/14 s/p PTCA/DES to prox RCA, FFR of mLAD (found to be  not hemodynamically significant), also occluded distal LCx prior to terminal small OM branch with L-L collaterals. Residual dz for med rx.  . Dyslipidemia   . Hyperglycemia    a. A1C 6.1 in 09/2014.  Marland Kitchen Hypothyroidism   . Ischemic cardiomyopathy    a. EF 45% by cath 09/2014.  . Obesity   . Plantar fasciitis of left foot   . Psoriasis   . RBBB   . Renal cyst 01/22/2014   94mm left kidney. Bozniak Class I in discussion with [facility]. No further follow up.      Past Surgical History:  Procedure Laterality Date  . CARDIAC CATHETERIZATION N/A 10/20/2014   Procedure: CORONARY STENT INTERVENTION;  Surgeon: Zhion Pevehouse M [place], MD;  Location: [facility] CATH LAB;  Service: Cardiovascular;  Laterality: N/A;  . COLONOSCOPY     30s. concern for diverticulitis.   . CORONARY ANGIOPLASTY WITH STENT PLACEMENT  10/20/2014   "1"  . FRACTIONAL FLOW RESERVE WIRE  10/20/2014   Procedure: FRACTIONAL FLOW RESERVE WIRE;  Surgeon: Michelina Mexicano M [place], MD;  Location: [facility] CATH LAB;  Service: Cardiovascular;;  . LEFT HEART CATHETERIZATION WITH CORONARY ANGIOGRAM N/A 10/20/2014   Procedure: LEFT HEART CATHETERIZATION WITH CORONARY ANGIOGRAM;  Surgeon: Nelvin Tomb M [place], MD;  Location: [facility] CATH LAB;  Service: Cardiovascular;  Laterality: N/A;     Current Outpatient Medications  Medication Sig Dispense Refill  . ALPRAZolam Prudy Feeler)  1 MG tablet Take 0.5-1 tablets (0.5-1 mg total) by mouth at bedtime as needed for sleep (with travel). (Patient not taking: Reported on 02/20/2020) 30 tablet 1  . aspirin 81 MG tablet Take 81 mg by mouth daily.    Marland Kitchen atorvastatin (LIPITOR) 80 MG tablet Take 1 tablet (80 mg total) by mouth every evening. MUST KEEP APPOINTMENT 12/24/18 WITH DR [place] FOR FUTURE REFILLS 90 tablet 3  . cyclobenzaprine (FLEXERIL) 5 MG tablet Take 1 tablet (5 mg total) by mouth 3 (three) times daily as needed for muscle spasms (do not drive for 8 hours after use). 30 tablet 0  . levothyroxine (SYNTHROID) 75 MCG tablet  TAKE 1 TABLET BY MOUTH EVERY DAY 90 tablet 1  . metoprolol succinate (TOPROL-XL) 25 MG 24 hr tablet TAKE 1 TABLET BY MOUTH DAILY. 90 tablet 4  . nitroGLYCERIN (NITROSTAT) 0.4 MG SL tablet Place 1 tablet (0.4 mg total) under the tongue every 5 (five) minutes as needed for chest pain. 30 tablet 3  . tadalafil (CIALIS) 20 MG tablet TAKE 1 TABLET BY MOUTH EVERY 3 DAYS AS NEEDED FOR ERECTILE DYSFUNCTION 6 tablet 1  . tiZANidine (ZANAFLEX) 4 MG tablet Take 1 tablet (4 mg total) by mouth every 6 (six) hours as needed for muscle spasms. 30 tablet 1  . traMADol (ULTRAM) 50 MG tablet Take 1 tablet (50 mg total) by mouth every 8 (eight) hours as needed for severe pain. 15 tablet 0   No current facility-administered medications for this visit.    Allergies:   Patient has no known allergies.    Social History:  The patient  reports that he has never smoked. He has never used smokeless tobacco. He reports current alcohol use of about 4.0 standard drinks of alcohol per week. He reports that he does not use drugs.   Family History:  The patient's family history includes Bipolar disorder in his brother; Diabetes in his brother; Hypothyroidism in his mother and sister; Lung cancer in an other family member.    ROS:  Please see the history of present illness.   Otherwise, review of systems are positive for none.   All other systems are reviewed and negative.    PHYSICAL EXAM: VS:  There were no vitals taken for this visit. , BMI There is no height or weight on file to calculate BMI. GENERAL:  Well appearing, overweight WM in NAD HEENT:  PERRL, EOMI, sclera are clear. Oropharynx is clear. NECK:  No jugular venous distention, carotid upstroke brisk and symmetric, no bruits, no thyromegaly or adenopathy LUNGS:  Clear to auscultation bilaterally CHEST:  Unremarkable HEART:  RRR,  PMI not displaced or sustained,S1 and S2 within normal limits, no S3, no S4: no clicks, no rubs, gr 2/6 SEM radiating to carotids  bilaterally.  ABD:  Soft, nontender. BS +, no masses or bruits. No hepatomegaly, no splenomegaly EXT:  2 + pulses throughout, no edema, no cyanosis no clubbing SKIN:  Warm and dry.  No rashes NEURO:  Alert and oriented x 3. Cranial nerves II through XII intact. PSYCH:  Cognitively intact   EKG:  EKG is  ordered today. NSR rate 73. LAD RBBB. I have personally reviewed and interpreted this study.   Recent Labs: 04/09/2020: ALT 29; BUN 22; Creat 1.24; Hemoglobin 14.7; Platelets 292; Potassium 4.4; Sodium 142    Lipid Panel    Component Value Date/Time   CHOL 126 07/11/2019 1604   CHOL 116 02/07/2017 1020   TRIG 139.0 07/11/2019 1604  HDL 44.50 07/11/2019 1604   HDL 40 02/07/2017 1020   CHOLHDL 3 07/11/2019 1604   VLDL 27.8 07/11/2019 1604   LDLCALC 54 07/11/2019 1604   LDLCALC 61 02/07/2017 1020   LDLDIRECT 61 11/24/2017 1544     Lab Results  Component Value Date   WBC 7.3 04/09/2020   HGB 14.7 04/09/2020   HCT 43.5 04/09/2020   PLT 292 04/09/2020   GLUCOSE 110 (H) 04/09/2020   CHOL 126 07/11/2019   TRIG 139.0 07/11/2019   HDL 44.50 07/11/2019   LDLDIRECT 61 11/24/2017   LDLCALC 54 07/11/2019   ALT 29 04/09/2020   AST 21 04/09/2020   NA 142 04/09/2020   K 4.4 04/09/2020   CL 109 04/09/2020   CREATININE 1.24 04/09/2020   BUN 22 04/09/2020   CO2 23 04/09/2020   TSH 4.37 07/11/2019   PSA 0.84 07/11/2019   INR 1.01 10/18/2014   HGBA1C 6.1 07/11/2019     Wt Readings from Last 3 Encounters:  04/10/20 234 lb (106.1 kg)  04/09/20 235 lb 12.8 oz (107 kg)  02/20/20 240 lb 12.8 oz (109.2 kg)      Other studies Reviewed: Additional studies/ records that were reviewed today include: none  ETT 02/12/19: Study Highlights   The patient walked for 9 minutes and 10 seconds on a standard Bruce protocol treadmill test  He achieved a peak heart rate of 164 which is 92% predicted maximal heart rate. He had a right bundle branch block at baseline.  The blood pressure  response to exercise was normal.  There were no ST or T wave changes to suggest ischemia at peak exercise.  This is interpreted as a negative stress test. There is no evidence of ischemia.     ASSESSMENT AND PLAN:   1. CAD-  s/p PTCA/DES to prox RCA (09/2014) Myoview in Dec 2016 as noted above. Normal ETT in August 2020. He is asymptomatic.  -- Continue ASA, statin and BB.  -- Encourage more aerobic activity and weight loss  2. Ischemic CM- EF has recovered. Normal Echo in September 2017  3. HLD- Continue statin with excellent control. Will reduce dose of lipitor to 40 mg daily to see if we can help his muscle aches. Repeat  Lab 3 months.  4. Mild aortic stenosis. Last Echo 2017. Will plan to repeat within the next year.    Current medicines are reviewed at length with the patient today.  The patient does not have concerns regarding medicines.  The following changes have been made:  Reduce lipitor to 40 mg daily.  Labs/ tests ordered today include:   No orders of the defined types were placed in this encounter.    Disposition:   FU with Dr. [place]iland in 6 months   Signed, Kleber Crean [place], MD  08/22/2020 11:44 AM

## 2020-08-24 ENCOUNTER — Ambulatory Visit: Payer: BC Managed Care – PPO | Admitting: Cardiology

## 2020-09-28 ENCOUNTER — Other Ambulatory Visit: Payer: Self-pay | Admitting: Family Medicine

## 2021-01-22 NOTE — Progress Notes (Signed)
Cardiology Office Note   Date:  02/01/2021   ID:  Luke Rice, DOB [birth year], MRN [id number]  PCP:  Shelva Majestic, MD  Cardiologist:  Dr. [place]   History of Present Illness: Luke Rice is a 60 y.o. male with a history of CAD s/p PTCA/DES to prox RCA (09/2014), ischemic cardiomyopathy EF 45, HLD, hypothyroidism, chronic RBBB, morbid obesity and psoriasis who presents for follow up.  He presented in 2016 with symptoms of angina.  Nuc results were abnormal with  reversibility along the inferolateral wall near the apex and mid segment, findings concerning for stress-induced ischemia, mild diffuse HK, EF 44%.  Cardiac cath 10/20/14 which revealed: 100% occlusion of the proximal RCA with collaterals. The distal LCx was also occluded. There was a 50-60% mid LAD stenosis with normal FFR. He  underwent DES to RCA.  Medical therapy was recommended for residual CAD in PDA/PLOM/ and distal LCx OM. He had a follow up stress Myoview in Dec 2016 which showed a small area of ischemia in the basal inferior wall c/w LCx occlusion. Perfusion was markedly improved from prior study. No ischemia in LAD territory. EF returned to normal on follow up Echo and Myoview.  He was seen there in September 2017 and had an ETT and Echo done. The Echo showed normal LV function with EF 59%. Normal valves. On ETT he reached 10 mets with no angina and no ST changes. Repeat ETT in August 2020 was normal.   On his last visit he was experiencing some myalgias. We reduced lipitor to 40 mg daily and this resolved. He denies any chest pain or dyspnea. Hasn't been exercising as much recently. Weight is stable.    Past Medical History:  Diagnosis Date   BPH (benign prostatic hyperplasia)    [place], not currently as bothresome   Coronary artery disease    a.  LHC 10/20/14 s/p PTCA/DES to prox RCA, FFR of mLAD (found to be not hemodynamically significant), also occluded distal LCx prior to  terminal small OM branch with L-L collaterals. Residual dz for med rx.   Dyslipidemia    Hyperglycemia    a. A1C 6.1 in 09/2014.   Hypothyroidism    Ischemic cardiomyopathy    a. EF 45% by cath 09/2014.   Obesity    Plantar fasciitis of left foot    Psoriasis    RBBB    Renal cyst 01/22/2014   25mm left kidney. Bozniak Class I in discussion with [facility]. No further follow up.      Past Surgical History:  Procedure Laterality Date   CARDIAC CATHETERIZATION N/A 10/20/2014   Procedure: CORONARY STENT INTERVENTION;  Surgeon: Ariyonna Twichell M [place], MD;  Location: [facility] CATH LAB;  Service: Cardiovascular;  Laterality: N/A;   COLONOSCOPY     30s. concern for diverticulitis.    CORONARY ANGIOPLASTY WITH STENT PLACEMENT  10/20/2014   "1"   FRACTIONAL FLOW RESERVE WIRE  10/20/2014   Procedure: FRACTIONAL FLOW RESERVE WIRE;  Surgeon: Dustyn Armbrister M [place], MD;  Location: [facility] CATH LAB;  Service: Cardiovascular;;   LEFT HEART CATHETERIZATION WITH CORONARY ANGIOGRAM N/A 10/20/2014   Procedure: LEFT HEART CATHETERIZATION WITH CORONARY ANGIOGRAM;  Surgeon: Keaston Pile M [place], MD;  Location: [facility] CATH LAB;  Service: Cardiovascular;  Laterality: N/A;     Current Outpatient Medications  Medication Sig Dispense Refill   aspirin 81 MG tablet Take 81 mg by mouth daily.     levothyroxine (SYNTHROID) 75  MCG tablet TAKE 1 TABLET BY MOUTH EVERY DAY 90 tablet 1   metoprolol succinate (TOPROL-XL) 25 MG 24 hr tablet TAKE 1 TABLET BY MOUTH DAILY. 90 tablet 4   atorvastatin (LIPITOR) 80 MG tablet Take 0.5 tablets (40 mg total) by mouth every evening. MUST KEEP APPOINTMENT 12/24/18 WITH DR [place] FOR FUTURE REFILLS 90 tablet 3   nitroGLYCERIN (NITROSTAT) 0.4 MG SL tablet Place 1 tablet (0.4 mg total) under the tongue every 5 (five) minutes as needed for chest pain. 30 tablet 3   No current facility-administered medications for this visit.    Allergies:   Patient has no known allergies.    Social History:  The  patient  reports that he has never smoked. He has never used smokeless tobacco. He reports current alcohol use of about 4.0 standard drinks of alcohol per week. He reports that he does not use drugs.   Family History:  The patient's family history includes Bipolar disorder in his brother; Diabetes in his brother; Hypothyroidism in his mother and sister; Lung cancer in an other family member.    ROS:  Please see the history of present illness.   Otherwise, review of systems are positive for none.   All other systems are reviewed and negative.    PHYSICAL EXAM: VS:  BP 122/84   Pulse 74   Resp 18   Ht 5\' 7"  (1.702 m)   Wt 233 lb 8 oz (105.9 kg)   SpO2 97%   BMI 36.57 kg/m  , BMI Body mass index is 36.57 kg/m. GENERAL:  Well appearing, overweight WM in NAD HEENT:  PERRL, EOMI, sclera are clear. Oropharynx is clear. NECK:  No jugular venous distention, carotid upstroke brisk and symmetric, no bruits, no thyromegaly or adenopathy LUNGS:  Clear to auscultation bilaterally CHEST:  Unremarkable HEART:  RRR,  PMI not displaced or sustained,S1 and S2 within normal limits, no S3, no S4: no clicks, no rubs, gr 2/6 SEM radiating to carotids bilaterally.  ABD:  Soft, nontender. BS +, no masses or bruits. No hepatomegaly, no splenomegaly EXT:  2 + pulses throughout, no edema, no cyanosis no clubbing SKIN:  Warm and dry.  No rashes NEURO:  Alert and oriented x 3. Cranial nerves II through XII intact. PSYCH:  Cognitively intact   EKG:  EKG is  not ordered today.    Recent Labs: 04/09/2020: ALT 29; BUN 22; Creat 1.24; Hemoglobin 14.7; Platelets 292; Potassium 4.4; Sodium 142    Lipid Panel    Component Value Date/Time   CHOL 126 07/11/2019 1604   CHOL 116 02/07/2017 1020   TRIG 139.0 07/11/2019 1604   HDL 44.50 07/11/2019 1604   HDL 40 02/07/2017 1020   CHOLHDL 3 07/11/2019 1604   VLDL 27.8 07/11/2019 1604   LDLCALC 54 07/11/2019 1604   LDLCALC 61 02/07/2017 1020   LDLDIRECT 61  11/24/2017 1544     Lab Results  Component Value Date   WBC 7.3 04/09/2020   HGB 14.7 04/09/2020   HCT 43.5 04/09/2020   PLT 292 04/09/2020   GLUCOSE 110 (H) 04/09/2020   CHOL 126 07/11/2019   TRIG 139.0 07/11/2019   HDL 44.50 07/11/2019   LDLDIRECT 61 11/24/2017   LDLCALC 54 07/11/2019   ALT 29 04/09/2020   AST 21 04/09/2020   NA 142 04/09/2020   K 4.4 04/09/2020   CL 109 04/09/2020   CREATININE 1.24 04/09/2020   BUN 22 04/09/2020   CO2 23 04/09/2020   TSH 4.37  07/11/2019   PSA 0.84 07/11/2019   INR 1.01 10/18/2014   HGBA1C 6.1 07/11/2019     Wt Readings from Last 3 Encounters:  02/01/21 233 lb 8 oz (105.9 kg)  04/10/20 234 lb (106.1 kg)  04/09/20 235 lb 12.8 oz (107 kg)      Other studies Reviewed: Additional studies/ records that were reviewed today include: none  ETT 02/12/19: Study Highlights  The patient walked for 9 minutes and 10 seconds on a standard Bruce protocol treadmill test He achieved a peak heart rate of 164 which is 92% predicted maximal heart rate. He had a right bundle branch block at baseline. The blood pressure response to exercise was normal. There were no ST or T wave changes to suggest ischemia at peak exercise. This is interpreted as a negative stress test. There is no evidence of ischemia.     ASSESSMENT AND PLAN:   1. CAD-  s/p PTCA/DES to prox RCA (09/2014) Myoview in Dec 2016 as noted above. Normal ETT in August 2020. He is asymptomatic.  -- Continue ASA, statin and BB.  -- Encourage more aerobic activity and weight loss  2. Ischemic CM- EF has recovered. Normal Echo in September 2017  3. HLD- Continue statin with excellent control. Now on lower statin dose. Will update labs today.  4. Mild aortic stenosis. Last Echo 2017. Will plan to repeat now.   Current medicines are reviewed at length with the patient today.  The patient does not have concerns regarding medicines.  The following changes have been made:  Reduce lipitor  to 40 mg daily.  Labs/ tests ordered today include:  CBC, CMET, lipids, TSH, PSA    Disposition:   FU with Dr. [place] in one year   Signed, Audie Wieser [place], MD  02/01/2021 9:49 AM

## 2021-02-01 ENCOUNTER — Other Ambulatory Visit: Payer: Self-pay

## 2021-02-01 ENCOUNTER — Encounter: Payer: Self-pay | Admitting: Cardiology

## 2021-02-01 ENCOUNTER — Ambulatory Visit (INDEPENDENT_AMBULATORY_CARE_PROVIDER_SITE_OTHER): Payer: BC Managed Care – PPO | Admitting: Cardiology

## 2021-02-01 VITALS — BP 122/84 | HR 74 | Resp 18 | Ht 67.0 in | Wt 233.5 lb

## 2021-02-01 DIAGNOSIS — I350 Nonrheumatic aortic (valve) stenosis: Secondary | ICD-10-CM | POA: Diagnosis not present

## 2021-02-01 DIAGNOSIS — E785 Hyperlipidemia, unspecified: Secondary | ICD-10-CM | POA: Diagnosis not present

## 2021-02-01 DIAGNOSIS — I25118 Atherosclerotic heart disease of native coronary artery with other forms of angina pectoris: Secondary | ICD-10-CM

## 2021-02-01 DIAGNOSIS — Z9861 Coronary angioplasty status: Secondary | ICD-10-CM | POA: Diagnosis not present

## 2021-02-01 DIAGNOSIS — I2510 Atherosclerotic heart disease of native coronary artery without angina pectoris: Secondary | ICD-10-CM | POA: Diagnosis not present

## 2021-02-01 LAB — CBC WITH DIFFERENTIAL/PLATELET
Basophils Absolute: 0 10*3/uL (ref 0.0–0.2)
Basos: 1 %
EOS (ABSOLUTE): 0.1 10*3/uL (ref 0.0–0.4)
Eos: 1 %
Hematocrit: 45.2 % (ref 37.5–51.0)
Hemoglobin: 15 g/dL (ref 13.0–17.7)
Immature Grans (Abs): 0 10*3/uL (ref 0.0–0.1)
Immature Granulocytes: 0 %
Lymphocytes Absolute: 1.8 10*3/uL (ref 0.7–3.1)
Lymphs: 28 %
MCH: 28 pg (ref 26.6–33.0)
MCHC: 33.2 g/dL (ref 31.5–35.7)
MCV: 84 fL (ref 79–97)
Monocytes Absolute: 0.7 10*3/uL (ref 0.1–0.9)
Monocytes: 11 %
Neutrophils Absolute: 3.7 10*3/uL (ref 1.4–7.0)
Neutrophils: 59 %
Platelets: 273 10*3/uL (ref 150–450)
RBC: 5.36 x10E6/uL (ref 4.14–5.80)
RDW: 12.9 % (ref 11.6–15.4)
WBC: 6.2 10*3/uL (ref 3.4–10.8)

## 2021-02-01 LAB — COMPREHENSIVE METABOLIC PANEL
ALT: 36 IU/L (ref 0–44)
AST: 23 IU/L (ref 0–40)
Albumin/Globulin Ratio: 1.8 (ref 1.2–2.2)
Albumin: 4.5 g/dL (ref 3.8–4.9)
Alkaline Phosphatase: 60 IU/L (ref 44–121)
BUN/Creatinine Ratio: 14 (ref 9–20)
BUN: 14 mg/dL (ref 6–24)
Bilirubin Total: 0.5 mg/dL (ref 0.0–1.2)
CO2: 23 mmol/L (ref 20–29)
Calcium: 10 mg/dL (ref 8.7–10.2)
Chloride: 102 mmol/L (ref 96–106)
Creatinine, Ser: 1.03 mg/dL (ref 0.76–1.27)
Globulin, Total: 2.5 g/dL (ref 1.5–4.5)
Glucose: 108 mg/dL — ABNORMAL HIGH (ref 65–99)
Potassium: 4.8 mmol/L (ref 3.5–5.2)
Sodium: 140 mmol/L (ref 134–144)
Total Protein: 7 g/dL (ref 6.0–8.5)
eGFR: 84 mL/min/{1.73_m2} (ref >59)

## 2021-02-01 LAB — LIPID PANEL
Chol/HDL Ratio: 2.8 ratio (ref 0.0–5.0)
Cholesterol, Total: 135 mg/dL (ref 100–199)
HDL: 49 mg/dL (ref >39)
LDL Chol Calc (NIH): 67 mg/dL (ref 0–99)
Triglycerides: 100 mg/dL (ref 0–149)
VLDL Cholesterol Cal: 19 mg/dL (ref 5–40)

## 2021-02-01 LAB — PSA: Prostate Specific Ag, Serum: 1 ng/mL (ref 0.0–4.0)

## 2021-02-01 MED ORDER — ATORVASTATIN CALCIUM 80 MG PO TABS
40.0000 mg | ORAL_TABLET | Freq: Every evening | ORAL | 3 refills | Status: DC
Start: 2021-02-01 — End: 2022-03-09

## 2021-02-01 NOTE — Patient Instructions (Signed)
Medication Instructions:   Continue same medications  *If you need a refill on your cardiac medications before your next appointment, please call your pharmacy*   Lab Work:  Cmet,lipid panel,cbc,psa today   Testing/Procedures:  Schedule Echo   Follow-Up: At [facility], you and your health needs are our priority.  As part of our continuing mission to provide you with exceptional heart care, we have created designated Provider Care Teams.  These Care Teams include your primary Cardiologist (physician) and Advanced Practice Providers (APPs -  Physician Assistants and Nurse Practitioners) who all work together to provide you with the care you need, when you need it.  We recommend signing up for the patient portal called "MyChart".  Sign up information is provided on this After Visit Summary.  MyChart is used to connect with patients for Virtual Visits (Telemedicine).  Patients are able to view lab/test results, encounter notes, upcoming appointments, etc.  Non-urgent messages can be sent to your provider as well.   To learn more about what you can do with MyChart, go to [email or web address].    Your next appointment:  6 months   Call in Nov to schedule Feb appointment   The format for your next appointment: Office    Provider:  Dr.Jordan

## 2021-02-12 IMAGING — US US SCROTUM W/ DOPPLER COMPLETE
1 series · 13 of 25 positions shown · non-contrast
Comparison: None

CLINICAL DATA: RIGHT testicular discomfort question torsion, mass
or epididymitis

EXAM:
SCROTAL ULTRASOUND
DOPPLER ULTRASOUND OF THE TESTICLES
TECHNIQUE: Complete ultrasound examination of the testicles, epididymis, and
other scrotal structures was performed. Color and spectral Doppler
ultrasound were also utilized to evaluate blood flow to the
testicles.

[Series 1: us scrotum w/ doppler complete · 40 acquisitions, 13 frames shown]
[im 1/40]
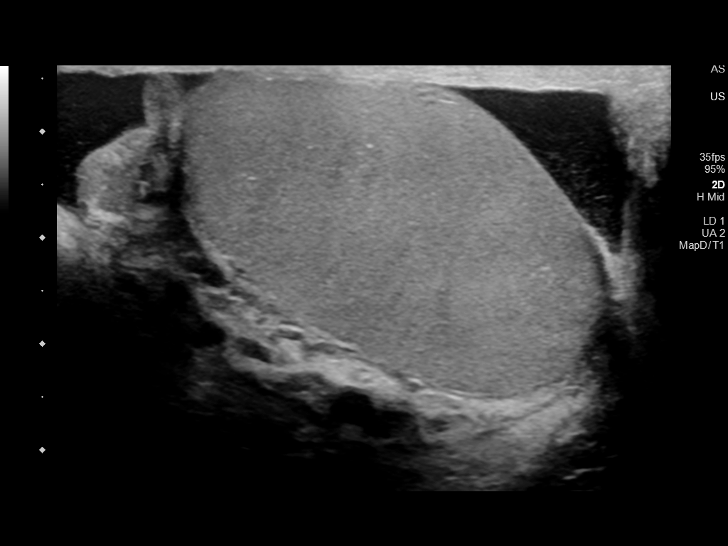
[im 4/40]
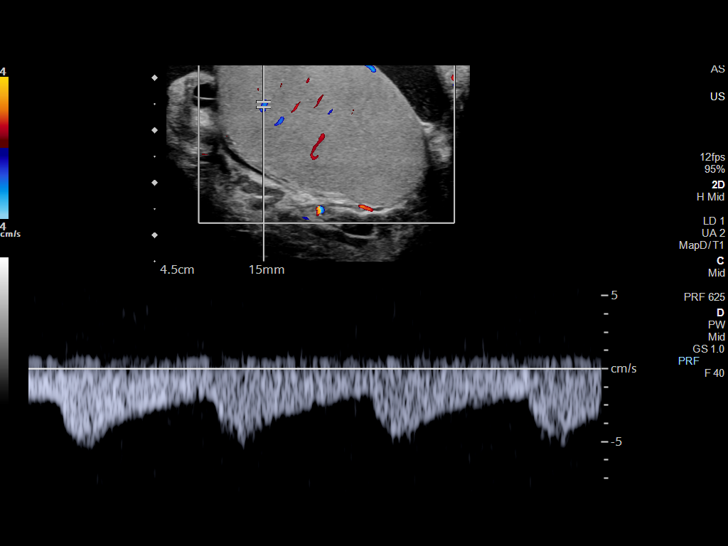
[im 7/40]
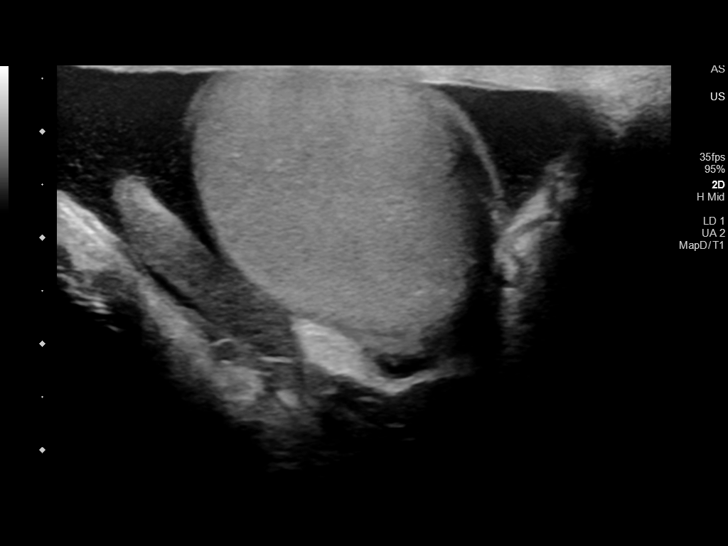
[im 10/40]
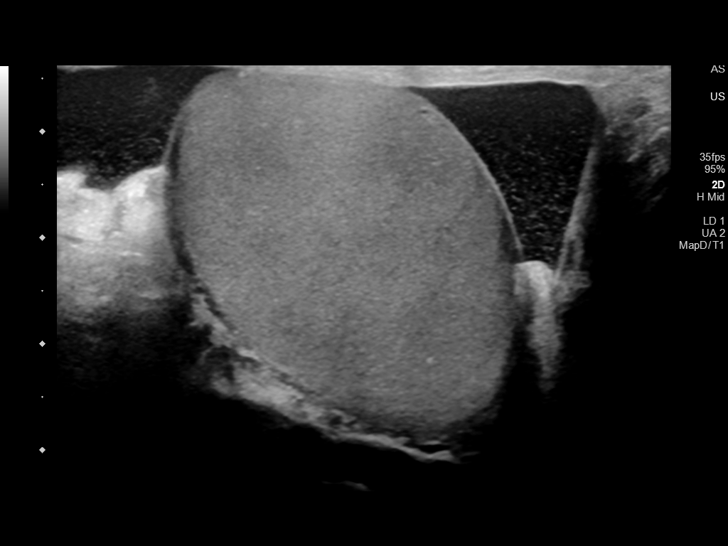
[im 14/40]
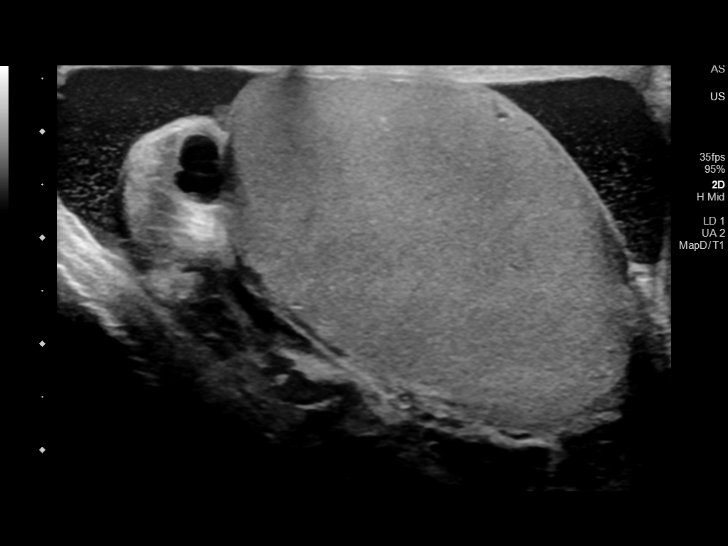
[im 17/40]
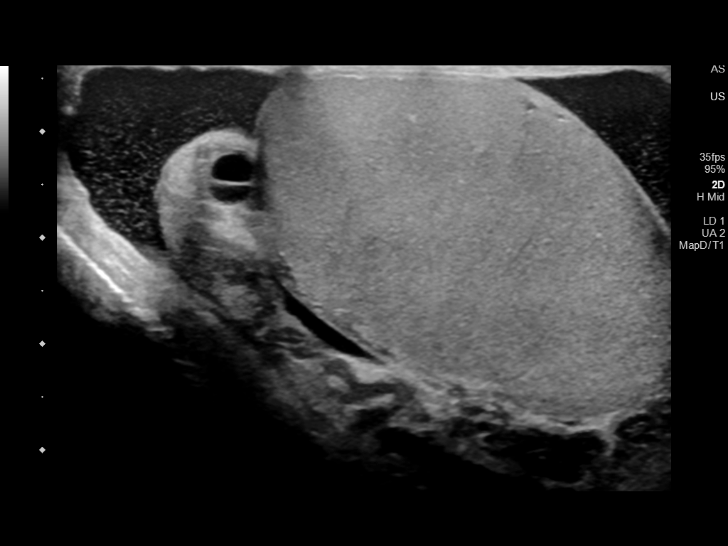
[im 20/40]
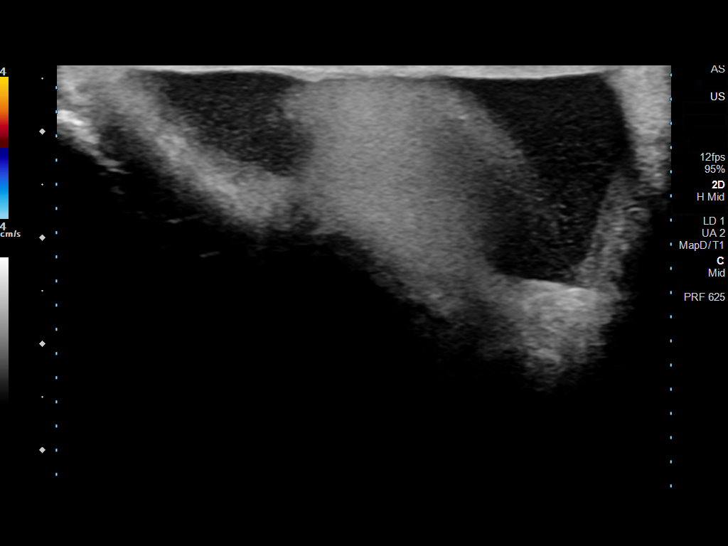
[im 23/40]
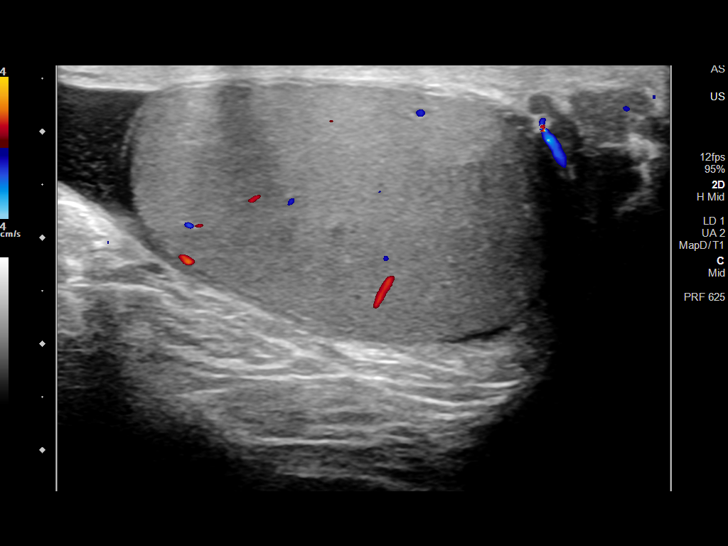
[im 27/40]
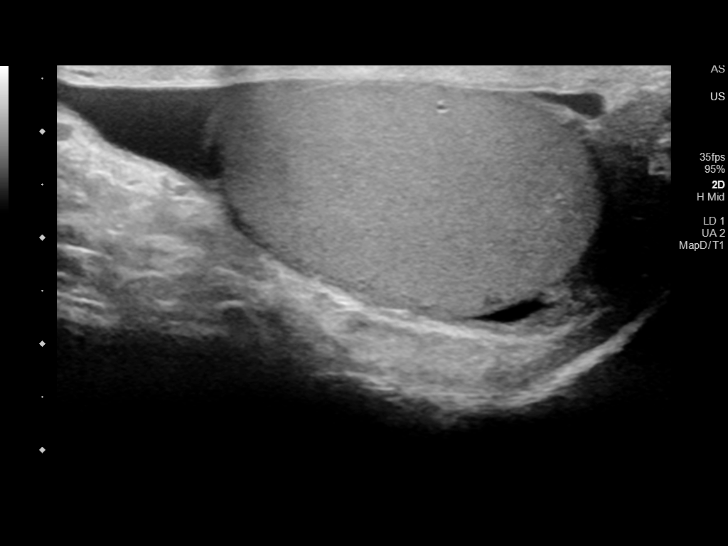
[im 30/40]
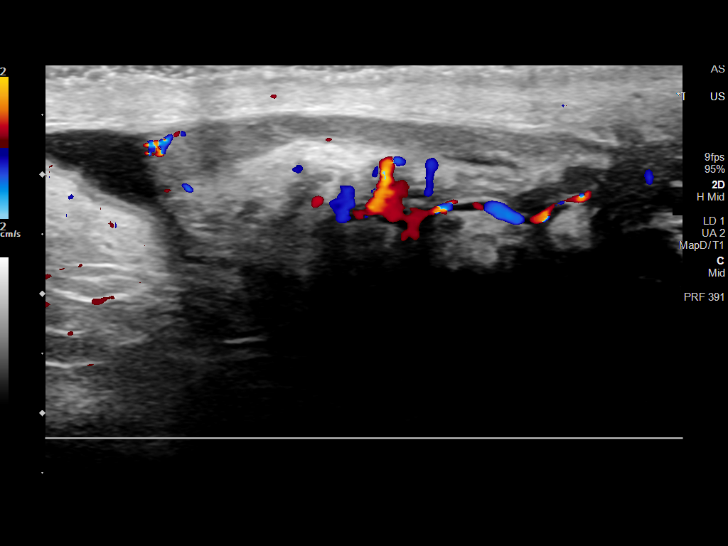
[im 33/40]
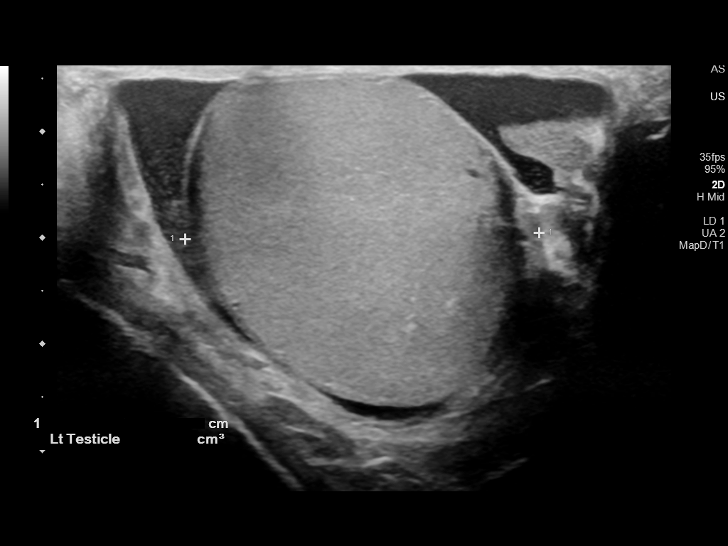
[im 36/40]
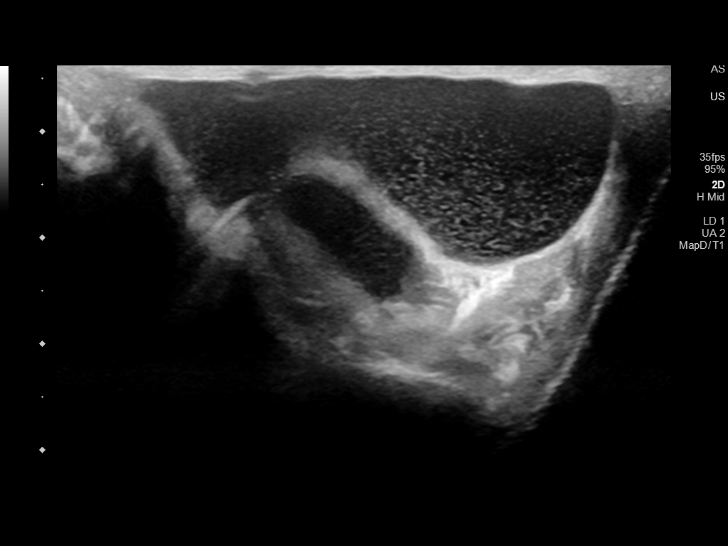
[im 40/40]
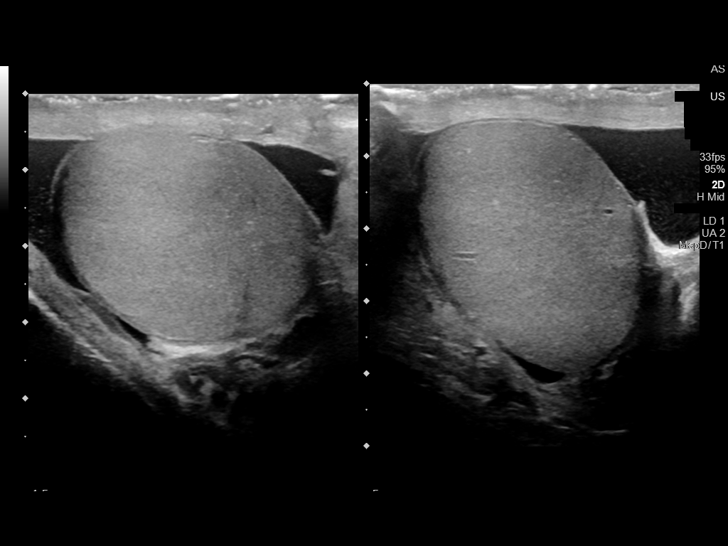

[13 of 25 positions shown; findings below may reference images not displayed]

FINDINGS: Right testicle

Measurements: 4.5 x 2.6 x 3.2 cm. Normal echogenicity without mass
or calcification. Internal blood flow present on color Doppler
imaging.

Left testicle

Measurements: 4.2 x 2.5 x 3.3 cm. Normal echogenicity without mass
or calcification. Internal blood flow present on color Doppler
imaging, symmetric with RIGHT.

Right epididymis: Heterogeneous appearance. Mildly complicated cyst
at epididymal head 6 x 5 x 5 mm.

Left epididymis:  Normal in size and appearance.

Hydrocele:  BILATERAL hydroceles containing debris

Varicocele:  Absent bilaterally

Pulsed Doppler interrogation of both testes demonstrates normal low
resistance arterial and venous waveforms bilaterally.
IMPRESSION: BILATERAL moderate hydroceles.

Complicated cyst at head of RIGHT epididymis 6 x 5 x 5 mm question
epididymal cyst versus spermatocele.

Otherwise normal appearances of the testes and epididymi
bilaterally.

## 2021-03-04 ENCOUNTER — Ambulatory Visit (HOSPITAL_COMMUNITY): Payer: BC Managed Care – PPO | Attending: Cardiology

## 2021-03-04 ENCOUNTER — Other Ambulatory Visit: Payer: Self-pay

## 2021-03-04 DIAGNOSIS — I25118 Atherosclerotic heart disease of native coronary artery with other forms of angina pectoris: Secondary | ICD-10-CM | POA: Diagnosis not present

## 2021-03-04 DIAGNOSIS — I350 Nonrheumatic aortic (valve) stenosis: Secondary | ICD-10-CM | POA: Insufficient documentation

## 2021-03-04 DIAGNOSIS — Z9861 Coronary angioplasty status: Secondary | ICD-10-CM | POA: Insufficient documentation

## 2021-03-04 DIAGNOSIS — I2510 Atherosclerotic heart disease of native coronary artery without angina pectoris: Secondary | ICD-10-CM | POA: Diagnosis not present

## 2021-03-04 DIAGNOSIS — E785 Hyperlipidemia, unspecified: Secondary | ICD-10-CM | POA: Insufficient documentation

## 2021-03-04 LAB — ECHOCARDIOGRAM COMPLETE
AR max vel: 1.63 cm2
AV Area VTI: 1.64 cm2
AV Area mean vel: 1.56 cm2
AV Mean grad: 16 mmHg
AV Peak grad: 28.3 mmHg
Ao pk vel: 2.66 m/s
Area-P 1/2: 3.91 cm2
P 1/2 time: 458 msec
S' Lateral: 3.2 cm

## 2021-03-07 ENCOUNTER — Encounter: Payer: Self-pay | Admitting: Family Medicine

## 2021-03-07 DIAGNOSIS — I350 Nonrheumatic aortic (valve) stenosis: Secondary | ICD-10-CM | POA: Insufficient documentation

## 2021-04-17 ENCOUNTER — Other Ambulatory Visit: Payer: Self-pay | Admitting: Cardiology

## 2021-05-17 DIAGNOSIS — L570 Actinic keratosis: Secondary | ICD-10-CM | POA: Diagnosis not present

## 2021-05-17 DIAGNOSIS — L400 Psoriasis vulgaris: Secondary | ICD-10-CM | POA: Diagnosis not present

## 2021-05-17 DIAGNOSIS — D225 Melanocytic nevi of trunk: Secondary | ICD-10-CM | POA: Diagnosis not present

## 2021-06-03 ENCOUNTER — Other Ambulatory Visit: Payer: Self-pay | Admitting: Family Medicine

## 2021-08-06 ENCOUNTER — Encounter: Payer: Self-pay | Admitting: Family Medicine

## 2021-08-27 NOTE — Progress Notes (Signed)
? ?Phone [phone] ?In person visit ?  ?Subjective:  ? ?Luke Rice is a 61 y.o. year old very pleasant male patient who presents for/with See problem oriented charting ?Chief Complaint  ?Patient presents with  ? ADD  ? ?This visit occurred during the SARS-CoV-2 public health emergency.  Safety protocols were in place, including screening questions prior to the visit, additional usage of staff PPE, and extensive cleaning of exam room while observing appropriate contact time as indicated for disinfecting solutions.  ? ?Past Medical History-  ?Patient Active Problem List  ? Diagnosis Date Noted  ? CAD S/P percutaneous coronary angioplasty 10/21/2014  ?  Priority: High  ? Psoriasis   ?  Priority: High  ? Mild aortic stenosis 03/07/2021  ?  Priority: Medium   ? Insomnia 02/10/2015  ?  Priority: Medium   ? Dyslipidemia 10/21/2014  ?  Priority: Medium   ? Hypothyroidism   ?  Priority: Medium   ? Erectile dysfunction 10/10/2019  ?  Priority: Low  ? RBBB   ?  Priority: Low  ? Obesity 01/22/2014  ?  Priority: Low  ? Migraine 01/22/2014  ?  Priority: Low  ? Plantar fasciitis of left foot   ?  Priority: Low  ? BPH (benign prostatic hyperplasia)   ?  Priority: Low  ? ? ?Medications- reviewed and updated ?Current Outpatient Medications  ?Medication Sig Dispense Refill  ? aspirin 81 MG tablet Take 81 mg by mouth daily.    ? atorvastatin (LIPITOR) 80 MG tablet Take 0.5 tablets (40 mg total) by mouth every evening. MUST KEEP APPOINTMENT 12/24/18 WITH DR Martinique FOR FUTURE REFILLS 90 tablet 3  ? levothyroxine (SYNTHROID) 75 MCG tablet TAKE 1 TABLET BY MOUTH EVERY DAY 90 tablet 0  ? metoprolol succinate (TOPROL-XL) 25 MG 24 hr tablet TAKE 1 TABLET BY MOUTH DAILY. 90 tablet 4  ? nitroGLYCERIN (NITROSTAT) 0.4 MG SL tablet Place 1 tablet (0.4 mg total) under the tongue every 5 (five) minutes as needed for chest pain. 30 tablet 3  ? ?No current facility-administered medications for this visit.  ? ?  ?Objective:  ?BP 124/70   Pulse 79    Temp 98.1 ?F (36.7 ?C)   Ht 5\' 7"  (1.702 m)   Wt 235 lb 9.6 oz (106.9 kg)   SpO2 97%   BMI 36.90 kg/m?  ?Gen: NAD, resting comfortably ?CV: RRR no murmurs rubs or gallops ?Lungs: CTAB no crackles, wheeze, rhonchi ?Ext: no edema ?Skin: warm, dry ?Rectal: normal tone, diffusely enlarged prostate, no masses or tenderness ? ?  ? ?Assessment and Plan  ? ?#Concern for ADD as well as some attitude concerns ?S: From my chart message August 06, 2021 "Dr. Yong Channel. I have been struggling a while with certain perspectives challenges that I would like to get some help with. Mostly relating to my attitude. Was thinking a psychiatrist or counselor might be of help. Not sure I?m ready for a marriage councilor.  ?Also am struggling with my attention. I am highly procrastinating and am sure I am ADD. Have been all my life but things seem much more challenging lately. Two separate issues but maybe loose link. Do you have any advise or recommendations?" ? ?Today he reports-has always had attention issues. Tends to procrastinate. Working at home now and has more freedom- great for work but doesn't help with focus issues. 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 ?He reports internalizing feelings in marriage  and feels somewhat disconnected from wife- barriers for their conversations- feels like he is not always as kind as he would like. Sex life has dropped down- doesn't know what to do to restore this.  ? ?Tends toward fatigue with stress. Can be irritable but doesn't feel overly anxious ?Depression screen [facility] 2/9 09/06/2021 07/11/2019 05/23/2018  ?Decreased Interest 0 1 0  ?Down, Depressed, Hopeless 0 0 0  ?PHQ - 2 Score 0 1 0  ?Altered sleeping 0 - -  ?Tired, decreased energy 2 - -  ?Change in appetite 0 - -  ?Feeling bad or failure about yourself  0 - -  ?Trouble concentrating 3 - -  ?Moving slowly or fidgety/restless 0 - -  ?Suicidal thoughts 0 - -  ?PHQ-9 Score 5 - -   ?Difficult doing work/chores Not difficult at all - -  ?A/P: patient with a lot of stress and ongoing attention issues- history of ADD- I think working on some underlying stressors may be helpful- referral ot behavioral health ?-considered welbutrin  - some CV risks ?-he asks about stimulants- concern with heart history ?-could consider [place] attentions specialists as well ? ?# urinary frequency/incomplete emptying/dribbling after voiding ?S:well over a year of issues. Mildly worsening- seems variable to a degree though. PSA stable when cardiology was kind enough to check.  ?- hard time starting stream if had to hold it ?- frequency daytime and nighttime ?- has a hard time feeling like he emptied  ?Lab Results  ?Component Value Date  ? PSA1 1.0 02/01/2021  ? PSA 0.84 07/11/2019  ? PSA 0.83 05/23/2018  ? PSA 0.53 05/22/2017  ?A/P: ongoing urinary frequency and LUTS. - we discussed trial of flomax. BPH on exam. Would need to eliminate cialis use with this  - already not using.  ? ?#Coronary artery disease with history of stent in 2016-sees Dr. Martinique ?#Ischemic cardiomyopathy-thankfully EF recovered ?#Hyperlipidemia ?S: Medication: Aspirin 81 mg and atorvastatin 80 mg . Also on metoprolol 25 mg XR.  Nitroglycerin on hand- has not had to use  ?Symptoms: no chest pain or shortness of breath  ?Lab Results  ?Component Value Date  ? CHOL 135 02/01/2021  ? HDL 49 02/01/2021  ? [place] 67 02/01/2021  ? LDLDIRECT 61 11/24/2017  ? TRIG 100 02/01/2021  ? CHOLHDL 2.8 02/01/2021  ? A/P: CAD asymptomatic-continue current medication.  Hyperlipidemia was well controlled on labs in August-continue current medication ? ?#hypothyroidism ?S: compliant On thyroid medication-levothyroxine 75 mcg ?Lab Results  ?Component Value Date  ? TSH 4.37 07/11/2019  ?A/P:hopefully stable- update tsh today. Continue current meds for now ?  ?#Mild aortic stenosis ?S: Stable on echocardiogram September 2022  ?A/P: stable on most recent echo-  continue to monitor  ? ?#Psoriasis ?S:Dr. Yetta Glassman out of [place]-  low dose cortisone pricks if bothers him and really helpful ?A/P: does really with current treatment  ?  ?#Erectile dysfunction  not using cialis - recommended against with stop of flomax. Thinks may be more mental block as well. Still with some morning erections ?  ? ? ?Recommended follow up: Return in about 6 months (around 03/09/2022) for physical or sooner if needed. ?Future Appointments  ?Date Time Provider [place]  ?12/03/2021  9:00 AM Martinique, Peter M, MD [facility]  ? ? ?Lab/Order associations: ?  ICD-10-CM   ?1. CAD S/P percutaneous coronary angioplasty  I25.10   ? Z98.61   ?  ?2. Hypothyroidism, unspecified type  E03.9   ?  ?3. Dyslipidemia  E78.5   ?  ?4. Stress  F43.9   ?  ?5. Attention deficit  R41.840   ?  ?6. Urinary frequency  R35.0   ?  ?7. Erectile dysfunction, unspecified erectile dysfunction type  N52.9   ?  ? ?No orders of the defined types were placed in this encounter. ? ?Time Spent: ?43 minutes of total time (9:11 AM- 9:54 AM) was spent on the date of the encounter performing the following actions: chart review prior to seeing the patient, obtaining history, performing a medically necessary exam, counseling on the treatment plan, placing orders, and documenting in our EHR.  ? ?I,Jada Bradford,acting as a scribe for Garret Reddish, MD.,have documented all relevant documentation on the behalf of Garret Reddish, MD,as directed by  Garret Reddish, MD while in the presence of Garret Reddish, MD. ? ?I, Garret Reddish, MD, have reviewed all documentation for this visit. The documentation on 09/06/21 for the exam, diagnosis, procedures, and orders are all accurate and complete. ? ?Return precautions advised.  ?Garret Reddish, MD ? ? ?

## 2021-09-06 ENCOUNTER — Ambulatory Visit (INDEPENDENT_AMBULATORY_CARE_PROVIDER_SITE_OTHER): Payer: BC Managed Care – PPO | Admitting: Family Medicine

## 2021-09-06 ENCOUNTER — Other Ambulatory Visit: Payer: Self-pay | Admitting: Family Medicine

## 2021-09-06 ENCOUNTER — Encounter: Payer: Self-pay | Admitting: Family Medicine

## 2021-09-06 VITALS — BP 124/70 | HR 79 | Temp 98.1°F | Ht 67.0 in | Wt 235.6 lb

## 2021-09-06 DIAGNOSIS — F439 Reaction to severe stress, unspecified: Secondary | ICD-10-CM

## 2021-09-06 DIAGNOSIS — E039 Hypothyroidism, unspecified: Secondary | ICD-10-CM

## 2021-09-06 DIAGNOSIS — Z9861 Coronary angioplasty status: Secondary | ICD-10-CM

## 2021-09-06 DIAGNOSIS — E785 Hyperlipidemia, unspecified: Secondary | ICD-10-CM

## 2021-09-06 DIAGNOSIS — R350 Frequency of micturition: Secondary | ICD-10-CM | POA: Diagnosis not present

## 2021-09-06 DIAGNOSIS — R41840 Attention and concentration deficit: Secondary | ICD-10-CM

## 2021-09-06 DIAGNOSIS — N529 Male erectile dysfunction, unspecified: Secondary | ICD-10-CM

## 2021-09-06 DIAGNOSIS — I2510 Atherosclerotic heart disease of native coronary artery without angina pectoris: Secondary | ICD-10-CM | POA: Diagnosis not present

## 2021-09-06 DIAGNOSIS — F909 Attention-deficit hyperactivity disorder, unspecified type: Secondary | ICD-10-CM

## 2021-09-06 LAB — COMPREHENSIVE METABOLIC PANEL
ALT: 29 U/L (ref 0–53)
AST: 23 U/L (ref 0–37)
Albumin: 4.5 g/dL (ref 3.5–5.2)
Alkaline Phosphatase: 48 U/L (ref 39–117)
BUN: 14 mg/dL (ref 6–23)
CO2: 26 mEq/L (ref 19–32)
Calcium: 9.5 mg/dL (ref 8.4–10.5)
Chloride: 102 mEq/L (ref 96–112)
Creatinine, Ser: 0.93 mg/dL (ref 0.40–1.50)
GFR: 90.37 mL/min (ref >60.00)
Glucose, Bld: 104 mg/dL — ABNORMAL HIGH (ref 70–99)
Potassium: 4.4 mEq/L (ref 3.5–5.1)
Sodium: 139 mEq/L (ref 135–145)
Total Bilirubin: 0.7 mg/dL (ref 0.2–1.2)
Total Protein: 6.9 g/dL (ref 6.0–8.3)

## 2021-09-06 LAB — POC URINALSYSI DIPSTICK (AUTOMATED)
Bilirubin, UA: NEGATIVE
Blood, UA: NEGATIVE
Glucose, UA: NEGATIVE
Ketones, UA: NEGATIVE
Leukocytes, UA: NEGATIVE
Nitrite, UA: NEGATIVE
Protein, UA: NEGATIVE
Spec Grav, UA: 1.025 (ref 1.010–1.025)
Urobilinogen, UA: 0.2 E.U./dL
pH, UA: 6 (ref 5.0–8.0)

## 2021-09-06 LAB — CBC WITH DIFFERENTIAL/PLATELET
Basophils Absolute: 0.1 10*3/uL (ref 0.0–0.1)
Basophils Relative: 0.9 % (ref 0.0–3.0)
Eosinophils Absolute: 0.1 10*3/uL (ref 0.0–0.7)
Eosinophils Relative: 1.4 % (ref 0.0–5.0)
HCT: 43.7 % (ref 39.0–52.0)
Hemoglobin: 14.4 g/dL (ref 13.0–17.0)
Lymphocytes Relative: 19.1 % (ref 12.0–46.0)
Lymphs Abs: 1.5 10*3/uL (ref 0.7–4.0)
MCHC: 32.9 g/dL (ref 30.0–36.0)
MCV: 85.7 fl (ref 78.0–100.0)
Monocytes Absolute: 0.6 10*3/uL (ref 0.1–1.0)
Monocytes Relative: 8.3 % (ref 3.0–12.0)
Neutro Abs: 5.5 10*3/uL (ref 1.4–7.7)
Neutrophils Relative %: 70.3 % (ref 43.0–77.0)
Platelets: 257 10*3/uL (ref 150.0–400.0)
RBC: 5.1 Mil/uL (ref 4.22–5.81)
RDW: 14.5 % (ref 11.5–15.5)
WBC: 7.8 10*3/uL (ref 4.0–10.5)

## 2021-09-06 LAB — TSH: TSH: 4.92 u[IU]/mL (ref 0.35–5.50)

## 2021-09-06 LAB — HEMOGLOBIN A1C: Hgb A1c MFr Bld: 6.2 % (ref 4.6–6.5)

## 2021-09-06 MED ORDER — TAMSULOSIN HCL 0.4 MG PO CAPS: 0.4000 mg | ORAL_CAPSULE | Freq: Every day | ORAL | 3 refills | Status: DC

## 2021-09-06 NOTE — Patient Instructions (Addendum)
Mychart [place] your third covid vaccine date ? ?Consider shingrix next visit ? ?Please call [phone] to schedule a visit with [facility] ?- please tell the office you were directly referred by Dr. Durene Cal ?- consider next steps with referral  or even medicine next visit if needed for attention issues and stress management ?Or you could consider  ?[email or web address] ? ?Please stop by lab before you go ?If you have mychart- we will send your results within 3 business days of [place] receiving them.  ?If you do not have mychart- we will call you about results within 5 business days of [place] receiving them.  ?*please also note that you will see labs on mychart as soon as they post. I will later go in and write notes on them- will say "notes from Dr. Durene Cal"  ? ?Recommended follow up: Return in about 6 months (around 03/09/2022) for physical or sooner if needed.  ?

## 2021-09-08 ENCOUNTER — Other Ambulatory Visit: Payer: Self-pay | Admitting: Family Medicine

## 2021-09-08 LAB — URINE CULTURE
MICRO NUMBER:: 13122101
SPECIMEN QUALITY:: ADEQUATE

## 2021-09-08 MED ORDER — CEPHALEXIN 500 MG PO CAPS
500.0000 mg | ORAL_CAPSULE | Freq: Three times a day (TID) | ORAL | 0 refills | Status: AC
Start: 2021-09-08 — End: 2021-09-18

## 2021-10-04 DIAGNOSIS — F4389 Other reactions to severe stress: Secondary | ICD-10-CM | POA: Diagnosis not present

## 2021-10-04 DIAGNOSIS — F411 Generalized anxiety disorder: Secondary | ICD-10-CM | POA: Diagnosis not present

## 2021-10-11 DIAGNOSIS — F4389 Other reactions to severe stress: Secondary | ICD-10-CM | POA: Diagnosis not present

## 2021-10-11 DIAGNOSIS — F411 Generalized anxiety disorder: Secondary | ICD-10-CM | POA: Diagnosis not present

## 2021-10-18 DIAGNOSIS — F4389 Other reactions to severe stress: Secondary | ICD-10-CM | POA: Diagnosis not present

## 2021-10-18 DIAGNOSIS — F411 Generalized anxiety disorder: Secondary | ICD-10-CM | POA: Diagnosis not present

## 2021-11-10 ENCOUNTER — Ambulatory Visit (INDEPENDENT_AMBULATORY_CARE_PROVIDER_SITE_OTHER): Payer: BC Managed Care – PPO | Admitting: Psychology

## 2021-11-10 DIAGNOSIS — F4323 Adjustment disorder with mixed anxiety and depressed mood: Secondary | ICD-10-CM

## 2021-11-10 NOTE — Progress Notes (Signed)
Comprehensive Clinical Assessment (CCA) Note ? ?11/10/2021 ?Luke Rice ?[id number] ? ?Time Spent: 1:41  pm - 2:26 pm: 45 Minutes ? ?Chief Complaint: No chief complaint on file. ? ?Visit Diagnosis: F43.23  ? ?Guardian/Payee:  Self   ? ?Paperwork requested: No  ? ?Reason for Visit /Presenting Problem: Adjustment Disorder ? ?Mental Status Exam: ?Appearance:   Well Groomed     ?Behavior:  Appropriate  ?Motor:  Normal  ?Speech/Language:   Clear and Coherent and Normal Rate  ?Affect:  Flat  ?Mood:  normal  ?Thought process:  normal  ?Thought content:    WNL  ?Sensory/Perceptual disturbances:    WNL  ?Orientation:  oriented to person, place, time/date, and situation  ?Attention:  Fair  ?Concentration:  Fair  ?Memory:  WNL  ?Fund of knowledge:   Good  ?Insight:    Good  ?Judgment:   Good  ?Impulse Control:  Good  ? ?Reported Symptoms:  Anxiety, depression, and inattention (ADHD). ? ?Risk Assessment: ?Danger to Self:  No ?Self-injurious Behavior: No ?Danger to Others: No ?Duty to Warn:no ?Physical Aggression / Violence:No  ?Access to Firearms a concern: No  ?Gang Involvement:No  ?Patient / guardian was educated about steps to take if suicide or homicide risk level increases between visits: n/a ?While future psychiatric events cannot be accurately predicted, the patient does not currently require acute inpatient psychiatric care and does not currently meet [facility] involuntary commitment criteria. ? ?Substance Abuse History: ?Current substance abuse: No    ? ?Caffeine: 4x coffee. ?Tobacco: none. ?Alcohol: Sporadic (every other weekend 3 x beers.  ?Substance: on occasion.  ? ?Past Psychiatric History:   ?Previous psychological history is significant for ADHD ?Outpatient Providers:no ?History of Psych Hospitalization: No  ?Psychological Testing: Attention/ADHD:  n/a   ? ?Hx of Xanax for sleep. Was previously prescribed due to frequent travel and poor sleep.  ? ?Abuse History:  ?Victim of: No.,  n/a    ?Report needed:  No. ?Victim of Neglect:No. ?Perpetrator of  n/a   ?Witness / Exposure to Domestic Violence: No   ?Protective Services Involvement: No  ?Witness to [employer] Violence:  No  ? ?Family History:  ?Family History  ?Problem Relation Age of Onset  ? Hypothyroidism Mother   ? Lung cancer Other   ?     father and grand father but both smokers. Both deceased due to this.   ? Diabetes Brother   ? Hypothyroidism Sister   ? Bipolar disorder Brother   ? Colon cancer Neg Hx   ? Rectal cancer Neg Hx   ? Stomach cancer Neg Hx   ? ? ?Living situation: the patient lives with their spouse ? ?Sexual Orientation: Straight ? ?Relationship Status: married  ?Name of spouse / other: Lennette (47 years) ?If a parent, number of children / ages:Will (26) & Leslie (29) ? ?Support Systems: spouse ? ?Financial Stress:  Yes , live on the edge of our means.  ? ?Income/Employment/Disability: Employment, Sports administrator.  ? ?Military Service: No  ? ?Educational History: ?Education: college graduate ? ?Religion/Sprituality/World View: ?Protestant ? ?Any cultural differences that may affect / interfere with treatment:  not applicable  ? ?Recreation/Hobbies: Going to [employer], fishing, golf, pickle ball, watching TV, eating out.  ? ?Stressors: Other: no sex life, wife's consistent worry.    ? ?Strengths: Supportive Relationships, Spirituality, Hopefulness, Self Advocate, and Able to Communicate Effectively ? ?Barriers:  mood and attention.   ? ?Legal History: ?Pending legal issue / charges: The patient has no significant  history of legal issues. ?History of legal issue / charges:  n/a ? ?Medical History/Surgical History: reviewed ?Past Medical History:  ?Diagnosis Date  ? BPH (benign prostatic hyperplasia)   ? [place], not currently as bothresome  ? Coronary artery disease   ? a.  LHC 10/20/14 s/p PTCA/DES to prox RCA, FFR of mLAD (found to be not hemodynamically significant), also occluded distal LCx prior to  terminal small OM branch with L-L collaterals. Residual dz for med rx.  ? Dyslipidemia   ? Hyperglycemia   ? a. A1C 6.1 in 09/2014.  ? Hypothyroidism   ? Ischemic cardiomyopathy   ? a. EF 45% by cath 09/2014.  ? Obesity   ? Plantar fasciitis of left foot   ? Psoriasis   ? RBBB   ? Renal cyst 01/22/2014  ? 53m left kidney. [facility] [facility] up.    ? ? ?Past Surgical History:  ?Procedure Laterality Date  ? CARDIAC CATHETERIZATION N/A 10/20/2014  ? Procedure: CORONARY STENT INTERVENTION;  Surgeon: Peter M JMartinique MD;  [facility];  Service: Cardiovascular;  Laterality: N/A;  ? COLONOSCOPY    ? 30s. concern for diverticulitis.   ? CORONARY ANGIOPLASTY WITH STENT PLACEMENT  10/20/2014  ? "1"  ? FRACTIONAL FLOW RESERVE WIRE  10/20/2014  ? Procedure: FRACTIONAL FLOW RESERVE WIRE;  Surgeon: Peter M JMartinique MD;  [facility];  Service: Cardiovascular;;  ? LEFT HEART CATHETERIZATION WITH CORONARY ANGIOGRAM N/A 10/20/2014  ? Procedure: LEFT HEART CATHETERIZATION WITH CORONARY ANGIOGRAM;  Surgeon: Peter M JMartinique MD;  [facility];  Service: Cardiovascular;  Laterality: N/A;  ? ? ?Medications: ?Current Outpatient Medications  ?Medication Sig Dispense Refill  ? aspirin 81 MG tablet Take 81 mg by mouth daily.    ? atorvastatin (LIPITOR) 80 MG tablet Take 0.5 tablets (40 mg total) by mouth every evening. MUST KEEP APPOINTMENT 12/24/18 WITH DR JMartiniqueFOR FUTURE REFILLS 90 tablet 3  ? levothyroxine (SYNTHROID) 75 MCG tablet TAKE 1 TABLET BY MOUTH EVERY DAY 90 tablet 0  ? metoprolol succinate (TOPROL-XL) 25 MG 24 hr tablet TAKE 1 TABLET BY MOUTH DAILY. 90 tablet 4  ? nitroGLYCERIN (NITROSTAT) 0.4 MG SL tablet Place 1 tablet (0.4 mg total) under the tongue every 5 (five) minutes as needed for chest pain. 30 tablet 3  ? tamsulosin (FLOMAX) 0.4 MG CAPS capsule Take 1 capsule (0.4 mg total) by mouth daily. 30 capsule 3  ? ?No current  facility-administered medications for this visit.  ? ? ?No Known Allergies ? ?Diagnoses:  ?Adjustment disorder with mixed anxiety and depressed mood ? ?Plan of Care: Outpatient Therapy & psychiatric treatment.  ? ?Narrative:  ? ?ALewie Loronparticipated from home, via video, and consented to treatment. Therapist participated from home office. We met online due to [place] Farmpandemic.  We discussed the limits of confidentiality prior to the start of the evaluation and AJayeshexpresses understanding and agreement towards this.  AMartywas referred by his primary care physician Dr. HYong Channeldue to symptoms of stress.  ATrashawnendorsed a history of depressive symptoms, appearing to be episodic, due to life stressors.  He has a history of an ADHD diagnosis in childhood with corresponding stimulant medication, specifically Ritalin, which he found to be effective.  He continues to endorse symptoms of ADHD in his current life, across multiple settings, affecting him in a significant way.  He provided a  recent example of flying to the wrong city for a job in spite of having all the relevant information.  During evaluation we completed the ASRS version 1.1 which was a positive screening for ADHD.  Therapist provided psychoeducation regarding possible treatment approaches including meeting with a psychologist for official ADHD testing, meeting with a psychiatric provider who could evaluate and treat, or meeting with a local practice that specializes in ADHD.  Resources will be provided after Darragh speaks with his PCP, Dr. Yong Channel, for additional guidance.  Additionally, and separately, Hassan endorsed numerous life stressors including marital stressors, financial stressors, along with mood related stressors.  Caileb noted that he has a "problem" with his attitude, that he gets flustered easily, and can often be "critical, short, and nasty".  He often noted being short with his wife as they work through issues.  He noted having poor  communication together regarding marital issues.  Brandan discussed that his wife has medical issues and anxiety.  He is open to the idea of marital counseling but is hopeful that he can address these issues on his own prio

## 2021-11-12 ENCOUNTER — Encounter: Payer: Self-pay | Admitting: Family Medicine

## 2021-11-15 ENCOUNTER — Other Ambulatory Visit: Payer: Self-pay

## 2021-11-15 DIAGNOSIS — R41840 Attention and concentration deficit: Secondary | ICD-10-CM

## 2021-11-16 ENCOUNTER — Other Ambulatory Visit: Payer: Self-pay

## 2021-11-16 DIAGNOSIS — R41840 Attention and concentration deficit: Secondary | ICD-10-CM

## 2021-11-30 ENCOUNTER — Ambulatory Visit (INDEPENDENT_AMBULATORY_CARE_PROVIDER_SITE_OTHER): Payer: BC Managed Care – PPO | Admitting: Psychology

## 2021-11-30 DIAGNOSIS — F4323 Adjustment disorder with mixed anxiety and depressed mood: Secondary | ICD-10-CM | POA: Diagnosis not present

## 2021-11-30 NOTE — Progress Notes (Signed)
[facility] Counselor/Therapist Progress Note  Patient ID: Luke Rice, MRN: [id number]    Date: 11/30/21  Time Spent: 9:02  am - 9:57 am : 55 Minutes  Treatment Type: Individual Therapy.  Reported Symptoms:anxie  Mental Status Exam: Appearance:  Casual and Well Groomed     Behavior: Appropriate  Motor: Normal  Speech/Language:  Clear and Coherent  Affect: Congruent  Mood: normal  Thought process: normal  Thought content:   WNL  Sensory/Perceptual disturbances:   WNL  Orientation: oriented to person, place, and time/date  Attention: Good  Concentration: Good  Memory: WNL  Fund of knowledge:  Good  Insight:   Good  Judgment:  Good  Impulse Control: Good   Risk Assessment: Danger to Self:  No Self-injurious Behavior: No Danger to Others: No Duty to Warn:no Physical Aggression / Violence:No  Access to Firearms a concern: No  Gang Involvement:No   Subjective:   [name] participated in the session, in person in the office with the therapist, and consented to treatment Waldron reviewed the events of the past week. He noted his continued struggles with ADHD and noted being prescribed ritalin as a child. He noted significant procrastination at work. He finds it in varying settings including the home.Therapist provided resources for ADHD psycho-education via email.   We reviewed numerous treatment approaches including CBT, BA, Problem Solving, and Solution focused therapy. Psych-education regarding the Constantino's diagnosis of Adjustment disorder with mixed anxiety and depressed mood  and ADHD, were provided during the session. We discussed Vito Beg goals treatment goals which include managing his symptoms, including ADHD, processing past events, and improving marriage. [name] provided verbal approval of the treatment plan.    Interventions: Psycho-education & Goal Setting.   Diagnosis:   Adjustment disorder with mixed anxiety and depressed  mood   Treatment Plan:  Client Abilities/Strengths Shahmeer is intelligent and forthcoming.   Support System: Family and friends.   Client Treatment Preferences OPT  Client Statement of Needs Zia would like to manage his ADHD symptoms, manage overall symptoms, improve marriage, and process past events.    Treatment Level Weekly  Symptoms  Anxiety: feeling nervous, difficulty managing worry, worrying about different things, trouble relaxing, restlessness, irritability.    (Status: maintained) Depression: loss of interest, feeling down, lethargy, overeating, difficulty concentrating.   (Status: maintained) ADHD: difficulty getting things in order, difficulty remembering appointments, avoidence of tasks that require a lot of thought, fidgeting, driven by a motor, careless mistakes, inattentiveness. (Status: maintained)  Goals:   Ewin experiences symptoms of anxiety, depression, and inattention.    Target Date: 12/01/22 Frequency: Weekly  Progress: 0 Modality: individual    Therapist will provide referrals for additional resources as appropriate.  Therapist will provide psycho-education regarding Shawndell's diagnosis and corresponding treatment approaches and interventions. Licensed Clinical Social Worker, [place], LCSW will support the patient's ability to achieve the goals identified. will employ CBT, BA, Problem-solving, Solution Focused, Mindfulness,  coping skills, & other evidenced-based practices will be used to promote progress towards healthy functioning to help manage decrease symptoms associated with his diagnosis.   Reduce overall level, frequency, and intensity of the feelings of depression, anxiety and ADHD evidenced by decreased in overall symptoms from 6 to 7 days/week to 0 to 1 days/week per client report for at least 3 consecutive months. Verbally express understanding of the relationship between feelings of depression, anxiety, ADHD and their impact on  thinking patterns and behaviors. Verbalize an understanding of the role that distorted thinking  plays in creating fears, excessive worry, and ruminations.  Mathis Fare participated in the creation of the treatment plan)    Delight Ovens, LCSW

## 2021-11-30 NOTE — Progress Notes (Signed)
Cardiology Office Note   Date:  12/03/2021   ID:  Luke Rice, DOB 20-Dec-1960, MRN [id number]  PCP:  Shelva Majestic, MD  Cardiologist:  Dr. Swaziland   History of Present Illness: Luke Rice is a 61 y.o. male with a history of CAD s/p PTCA/DES to prox RCA (09/2014), ischemic cardiomyopathy EF 45, HLD, hypothyroidism, chronic RBBB, morbid obesity and psoriasis who presents for follow up.  He presented in 2016 with symptoms of angina.  Nuc results were abnormal with  reversibility along the inferolateral wall near the apex and mid segment, findings concerning for stress-induced ischemia, mild diffuse HK, EF 44%.  Cardiac cath 10/20/14 which revealed: 100% occlusion of the proximal RCA with collaterals. The distal LCx was also occluded. There was a 50-60% mid LAD stenosis with normal FFR. He  underwent DES to RCA.  Medical therapy was recommended for residual CAD in PDA/PLOM/ and distal LCx OM. He had a follow up stress Myoview in Dec 2016 which showed a small area of ischemia in the basal inferior wall c/w LCx occlusion. Perfusion was markedly improved from prior study. No ischemia in LAD territory. EF returned to normal on follow up Echo and Myoview.  He was seen there in September 2017 and had an ETT and Echo done. The Echo showed normal LV function with EF 59%. Normal valves. On ETT he reached 10 mets with no angina and no ST changes. Repeat ETT in August 2020 was normal.   On follow up today he is doing very well. No chest pain or dyspnea. He has joined [employer]. Lost 5 lbs. Is planning to see psychiatry for treatment of ADDHD.    Past Medical History:  Diagnosis Date   BPH (benign prostatic hyperplasia)    [place], not currently as bothresome   Coronary artery disease    a.  LHC 10/20/14 s/p PTCA/DES to prox RCA, FFR of mLAD (found to be not hemodynamically significant), also occluded distal LCx prior to terminal small OM branch with L-L  collaterals. Residual dz for med rx.   Dyslipidemia    Hyperglycemia    a. A1C 6.1 in 09/2014.   Hypothyroidism    Ischemic cardiomyopathy    a. EF 45% by cath 09/2014.   Obesity    Plantar fasciitis of left foot    Psoriasis    RBBB    Renal cyst 01/22/2014   63mm left kidney. Bozniak Class I in discussion with [facility]. No further follow up.      Past Surgical History:  Procedure Laterality Date   CARDIAC CATHETERIZATION N/A 10/20/2014   Procedure: CORONARY STENT INTERVENTION;  Surgeon: Greer Wainright M Swaz[place], MD;  Location: [facility] CATH LAB;  Service: Cardiovascular;  Laterality: N/A;   COLONOSCOPY     30s. concern for diverticulitis.    CORONARY ANGIOPLASTY WITH STENT PLACEMENT  10/20/2014   "1"   FRACTIONAL FLOW RESERVE WIRE  10/20/2014   Procedure: FRACTIONAL FLOW RESERVE WIRE;  Surgeon: Sterlin Knightly M [place], MD;  Location: [facility] CATH LAB;  Service: Cardiovascular;;   LEFT HEART CATHETERIZATION WITH CORONARY ANGIOGRAM N/A 10/20/2014   Procedure: LEFT HEART CATHETERIZATION WITH CORONARY ANGIOGRAM;  Surgeon: Samoria Fedorko M [place], MD;  Location: [facility] CATH LAB;  Service: Cardiovascular;  Laterality: N/A;     Current Outpatient Medications  Medication Sig Dispense Refill   aspirin 81 MG tablet Take 81 mg by mouth daily.     atorvastatin (LIPITOR) 80 MG tablet Take 0.5 tablets (  40 mg total) by mouth every evening. MUST KEEP APPOINTMENT 12/24/18 WITH DR SwazilandJORDAN FOR FUTURE REFILLS 90 tablet 3   levothyroxine (SYNTHROID) 75 MCG tablet TAKE 1 TABLET BY MOUTH EVERY DAY 90 tablet 0   metoprolol succinate (TOPROL-XL) 25 MG 24 hr tablet TAKE 1 TABLET BY MOUTH DAILY. 90 tablet 4   nitroGLYCERIN (NITROSTAT) 0.4 MG SL tablet Place 1 tablet (0.4 mg total) under the tongue every 5 (five) minutes as needed for chest pain. 30 tablet 3   No current facility-administered medications for this visit.    Allergies:   Patient has no known allergies.    Social History:  The patient  reports that he has never  smoked. He has never used smokeless tobacco. He reports current alcohol use of about 4.0 standard drinks of alcohol per week. He reports that he does not use drugs.   Family History:  The patient's family history includes Bipolar disorder in his brother; Diabetes in his brother; Hypothyroidism in his mother and sister; Lung cancer in an other family member.    ROS:  Please see the history of present illness.   Otherwise, review of systems are positive for none.   All other systems are reviewed and negative.    PHYSICAL EXAM: VS:  BP 110/80 (BP Location: Left Arm)   Pulse 71   Ht 5\' 7"  (1.702 m)   Wt 228 lb 6.4 oz (103.6 kg)   SpO2 94%   BMI 35.77 kg/m  , BMI Body mass index is 35.77 kg/m. GENERAL:  Well appearing, overweight WM in NAD HEENT:  PERRL, EOMI, sclera are clear. Oropharynx is clear. NECK:  No jugular venous distention, carotid upstroke brisk and symmetric, no bruits, no thyromegaly or adenopathy LUNGS:  Clear to auscultation bilaterally CHEST:  Unremarkable HEART:  RRR,  PMI not displaced or sustained,S1 and S2 within normal limits, no S3, no S4: no clicks, no rubs, gr 2/6 SEM radiating to carotids bilaterally.  ABD:  Soft, nontender. BS +, no masses or bruits. No hepatomegaly, no splenomegaly EXT:  2 + pulses throughout, no edema, no cyanosis no clubbing SKIN:  Warm and dry.  No rashes NEURO:  Alert and oriented x 3. Cranial nerves II through XII intact. PSYCH:  Cognitively intact   EKG:  EKG is  not ordered today.    Recent Labs: 09/06/2021: ALT 29; BUN 14; Creatinine, Ser 0.93; Hemoglobin 14.4; Platelets 257.0; Potassium 4.4; Sodium 139; TSH 4.92    Lipid Panel    Component Value Date/Time   CHOL 135 02/01/2021 1006   TRIG 100 02/01/2021 1006   HDL 49 02/01/2021 1006   CHOLHDL 2.8 02/01/2021 1006   CHOLHDL 3 07/11/2019 1604   VLDL 27.8 07/11/2019 1604   LDLCALC 67 02/01/2021 1006   LDLDIRECT 61 11/24/2017 1544     Lab Results  Component Value Date    WBC 7.8 09/06/2021   HGB 14.4 09/06/2021   HCT 43.7 09/06/2021   PLT 257.0 09/06/2021   GLUCOSE 104 (H) 09/06/2021   CHOL 135 02/01/2021   TRIG 100 02/01/2021   HDL 49 02/01/2021   LDLDIRECT 61 11/24/2017   LDLCALC 67 02/01/2021   ALT 29 09/06/2021   AST 23 09/06/2021   NA 139 09/06/2021   K 4.4 09/06/2021   CL 102 09/06/2021   CREATININE 0.93 09/06/2021   BUN 14 09/06/2021   CO2 26 09/06/2021   TSH 4.92 09/06/2021   PSA 0.84 07/11/2019   INR 1.01 10/18/2014   HGBA1C  6.2 09/06/2021     Wt Readings from Last 3 Encounters:  12/03/21 228 lb 6.4 oz (103.6 kg)  09/06/21 235 lb 9.6 oz (106.9 kg)  02/01/21 233 lb 8 oz (105.9 kg)      Other studies Reviewed: Additional studies/ records that were reviewed today include: none  ETT 02/12/19: Study Highlights  The patient walked for 9 minutes and 10 seconds on a standard Bruce protocol treadmill test He achieved a peak heart rate of 164 which is 92% predicted maximal heart rate. He had a right bundle branch block at baseline. The blood pressure response to exercise was normal. There were no ST or T wave changes to suggest ischemia at peak exercise. This is interpreted as a negative stress test. There is no evidence of ischemia.     ASSESSMENT AND PLAN:   1. CAD-  s/p PTCA/DES to prox RCA (09/2014) Myoview in Dec 2016 as noted above. Normal ETT in August 2020. He is asymptomatic.  -- Continue ASA, statin and BB.  -- Encourage more aerobic activity and weight loss  2. Ischemic CM- EF has recovered. Normal Echo in September 2017  3. HLD- Continue statin with excellent control. Now on lower statin dose. Last LDL 67. Will have follow up labs with Dr Durene Cal  4. Mild aortic stenosis. Repeat Echo last year showed no change.    Disposition:   FU with me in one year   Signed, Oskar Cretella [place], MD  12/03/2021 9:32 AM

## 2021-12-03 ENCOUNTER — Other Ambulatory Visit: Payer: Self-pay | Admitting: Family Medicine

## 2021-12-03 ENCOUNTER — Encounter: Payer: Self-pay | Admitting: Cardiology

## 2021-12-03 ENCOUNTER — Ambulatory Visit (INDEPENDENT_AMBULATORY_CARE_PROVIDER_SITE_OTHER): Payer: BC Managed Care – PPO | Admitting: Cardiology

## 2021-12-03 VITALS — BP 110/80 | HR 71 | Ht 67.0 in | Wt 228.4 lb

## 2021-12-03 DIAGNOSIS — I2510 Atherosclerotic heart disease of native coronary artery without angina pectoris: Secondary | ICD-10-CM

## 2021-12-03 DIAGNOSIS — E785 Hyperlipidemia, unspecified: Secondary | ICD-10-CM

## 2021-12-03 DIAGNOSIS — I25118 Atherosclerotic heart disease of native coronary artery with other forms of angina pectoris: Secondary | ICD-10-CM | POA: Diagnosis not present

## 2021-12-03 DIAGNOSIS — Z9861 Coronary angioplasty status: Secondary | ICD-10-CM | POA: Diagnosis not present

## 2021-12-03 NOTE — Patient Instructions (Signed)
Medication Instructions:  Your physician recommends that you continue on your current medications as directed. Please refer to the Current Medication list given to you today.  *If you need a refill on your cardiac medications before your next appointment, please call your pharmacy*   Lab Work: None If you have labs (blood work) drawn today and your tests are completely normal, you will receive your results only by: MyChart Message (if you have MyChart) OR A paper copy in the mail If you have any lab test that is abnormal or we need to change your treatment, we will call you to review the results.   Testing/Procedures: None   Follow-Up: At [facility], you and your health needs are our priority.  As part of our continuing mission to provide you with exceptional heart care, we have created designated Provider Care Teams.  These Care Teams include your primary Cardiologist (physician) and Advanced Practice Providers (APPs -  Physician Assistants and Nurse Practitioners) who all work together to provide you with the care you need, when you need it.  We recommend signing up for the patient portal called "MyChart".  Sign up information is provided on this After Visit Summary.  MyChart is used to connect with patients for Virtual Visits (Telemedicine).  Patients are able to view lab/test results, encounter notes, upcoming appointments, etc.  Non-urgent messages can be sent to your provider as well.   To learn more about what you can do with MyChart, go to [email or web address].    Your next appointment:   1 year(s)  The format for your next appointment:   In Person  Provider:   Peter Jordan, MD     Other Instructions   Important Information About Sugar       

## 2021-12-04 ENCOUNTER — Other Ambulatory Visit: Payer: Self-pay | Admitting: Family Medicine

## 2021-12-14 DIAGNOSIS — D225 Melanocytic nevi of trunk: Secondary | ICD-10-CM | POA: Diagnosis not present

## 2021-12-14 DIAGNOSIS — L400 Psoriasis vulgaris: Secondary | ICD-10-CM | POA: Diagnosis not present

## 2021-12-14 DIAGNOSIS — L570 Actinic keratosis: Secondary | ICD-10-CM | POA: Diagnosis not present

## 2021-12-21 ENCOUNTER — Ambulatory Visit: Payer: BC Managed Care – PPO | Admitting: Psychology

## 2021-12-21 ENCOUNTER — Encounter: Payer: Self-pay | Admitting: Family Medicine

## 2021-12-22 ENCOUNTER — Other Ambulatory Visit: Payer: Self-pay

## 2021-12-22 MED ORDER — TADALAFIL 20 MG PO TABS: ORAL_TABLET | ORAL | 5 refills | Status: DC

## 2022-01-11 ENCOUNTER — Ambulatory Visit: Payer: BC Managed Care – PPO | Admitting: Psychology

## 2022-03-08 ENCOUNTER — Other Ambulatory Visit: Payer: Self-pay | Admitting: Family Medicine

## 2022-03-09 ENCOUNTER — Other Ambulatory Visit: Payer: Self-pay | Admitting: Cardiology

## 2022-03-21 ENCOUNTER — Encounter: Payer: Self-pay | Admitting: *Deleted

## 2022-04-11 ENCOUNTER — Ambulatory Visit: Payer: BC Managed Care – PPO | Admitting: Podiatry

## 2022-04-11 ENCOUNTER — Ambulatory Visit (INDEPENDENT_AMBULATORY_CARE_PROVIDER_SITE_OTHER): Payer: BC Managed Care – PPO

## 2022-04-11 ENCOUNTER — Ambulatory Visit (INDEPENDENT_AMBULATORY_CARE_PROVIDER_SITE_OTHER): Payer: BC Managed Care – PPO | Admitting: Podiatry

## 2022-04-11 DIAGNOSIS — M722 Plantar fascial fibromatosis: Secondary | ICD-10-CM

## 2022-04-11 DIAGNOSIS — Z79899 Other long term (current) drug therapy: Secondary | ICD-10-CM | POA: Diagnosis not present

## 2022-04-11 DIAGNOSIS — R41840 Attention and concentration deficit: Secondary | ICD-10-CM | POA: Diagnosis not present

## 2022-04-11 NOTE — Progress Notes (Signed)
Chief Complaint  Patient presents with   Foot Pain    Patient is here for bilateral foot pain.patient states that he has plantar fasciitis and would like to get some orthotics.    Subjective: 61 y.o. male presenting today as a new patient for evaluation of chronic bilateral heel pain left greater than the right.  Patient states that he has had a history of plantar fasciitis over the last 10-15 years intermittently.  He currently wears OTC arch supports with his shoes which do help mitigate his pain and tenderness.  He is inquiring today about some custom molded orthotics.  He presents for further treatment and evaluation   Past Medical History:  Diagnosis Date   BPH (benign prostatic hyperplasia)    [place], not currently as bothresome   Coronary artery disease    a.  LHC 10/20/14 s/p PTCA/DES to prox RCA, FFR of mLAD (found to be not hemodynamically significant), also occluded distal LCx prior to terminal small OM branch with L-L collaterals. Residual dz for med rx.   Dyslipidemia    Hyperglycemia    a. A1C 6.1 in 09/2014.   Hypothyroidism    Ischemic cardiomyopathy    a. EF 45% by cath 09/2014.   Obesity    Plantar fasciitis of left foot    Psoriasis    RBBB    Renal cyst 01/22/2014   74mm left kidney. Bozniak Class I in discussion with [facility]. No further follow up.     Past Surgical History:  Procedure Laterality Date   CARDIAC CATHETERIZATION N/A 10/20/2014   Procedure: CORONARY STENT INTERVENTION;  Surgeon: Peter M Martinique, MD;  Location: [facility] CATH LAB;  Service: Cardiovascular;  Laterality: N/A;   COLONOSCOPY     30s. concern for diverticulitis.    CORONARY ANGIOPLASTY WITH STENT PLACEMENT  10/20/2014   "1"   FRACTIONAL FLOW RESERVE WIRE  10/20/2014   Procedure: FRACTIONAL FLOW RESERVE WIRE;  Surgeon: Peter M Martinique, MD;  Location: [facility] CATH LAB;  Service: Cardiovascular;;   LEFT HEART CATHETERIZATION WITH CORONARY  ANGIOGRAM N/A 10/20/2014   Procedure: LEFT HEART CATHETERIZATION WITH CORONARY ANGIOGRAM;  Surgeon: Peter M Martinique, MD;  Location: [facility] CATH LAB;  Service: Cardiovascular;  Laterality: N/A;   No Known Allergies   Objective: Physical Exam General: The patient is alert and oriented x3 in no acute distress.  Dermatology: Skin is warm, dry and supple bilateral lower extremities. Negative for open lesions or macerations bilateral.   Vascular: Dorsalis Pedis and Posterior Tibial pulses palpable bilateral.  Capillary fill time is immediate to all digits.  Neurological: Epicritic and protective threshold intact bilateral.   Musculoskeletal: Tenderness to palpation to the plantar aspect of the bilateral heels along the plantar fascia. All other joints range of motion within normal limits bilateral. Strength 5/5 in all groups bilateral.   Radiographic exam B/L feet 04/11/2022: Normal osseous mineralization. Joint spaces preserved. No fracture/dislocation/boney destruction. No other soft tissue abnormalities or radiopaque foreign bodies.  Plantar heel spur noted to the left foot on lateral view  Assessment: 1. plantar fasciitis bilateral feet  Plan of Care:  1. Patient evaluated. Xrays reviewed.   2.  Today we discussed conservative measures to mitigate any and manage the chronic plantar fasciitis including custom molded orthotics.  Patient would like to get custom molded orthotics made.  He already has an appointment today with the orthotics department 3.  Return to clinic for orthotics pickup.  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   Felecia Shelling, DPM [facility]  Dr. Felecia Shelling, DPM    [address] [place], [place] [address]                Office [phone]  Fax [phone]

## 2022-04-12 DIAGNOSIS — R41840 Attention and concentration deficit: Secondary | ICD-10-CM | POA: Diagnosis not present

## 2022-04-12 DIAGNOSIS — Z79899 Other long term (current) drug therapy: Secondary | ICD-10-CM | POA: Diagnosis not present

## 2022-05-11 ENCOUNTER — Ambulatory Visit (INDEPENDENT_AMBULATORY_CARE_PROVIDER_SITE_OTHER): Payer: BC Managed Care – PPO

## 2022-05-11 DIAGNOSIS — M722 Plantar fascial fibromatosis: Secondary | ICD-10-CM

## 2022-05-11 NOTE — Progress Notes (Signed)
Patient presents today to pick up custom molded foot orthotics, diagnosed with plantar fasciitis by Dr. Logan Bores.   Orthotics were dispensed and fit was satisfactory. Reviewed instructions for break-in and wear. Written instructions given to patient.  Patient will follow up as needed.  Patient verbalized that the left arch feels like it is a little "forward" and the Right feels flat. Advised patient that the order for each orthotic is the same per foot. Patient shoes today are not the most supportive, Patient will try orthotics in a different pair of shoes when he gets home.  [facility] - order # [id number]

## 2022-05-13 DIAGNOSIS — F902 Attention-deficit hyperactivity disorder, combined type: Secondary | ICD-10-CM | POA: Diagnosis not present

## 2022-05-13 DIAGNOSIS — Z79899 Other long term (current) drug therapy: Secondary | ICD-10-CM | POA: Diagnosis not present

## 2022-05-16 DIAGNOSIS — F902 Attention-deficit hyperactivity disorder, combined type: Secondary | ICD-10-CM | POA: Diagnosis not present

## 2022-05-16 DIAGNOSIS — Z79899 Other long term (current) drug therapy: Secondary | ICD-10-CM | POA: Diagnosis not present

## 2022-05-18 DIAGNOSIS — F902 Attention-deficit hyperactivity disorder, combined type: Secondary | ICD-10-CM | POA: Diagnosis not present

## 2022-06-04 ENCOUNTER — Other Ambulatory Visit: Payer: Self-pay | Admitting: Family Medicine

## 2022-06-05 ENCOUNTER — Other Ambulatory Visit: Payer: Self-pay | Admitting: Cardiology

## 2022-06-09 ENCOUNTER — Encounter: Payer: Self-pay | Admitting: *Deleted

## 2022-06-15 DIAGNOSIS — Z79899 Other long term (current) drug therapy: Secondary | ICD-10-CM | POA: Diagnosis not present

## 2022-06-15 DIAGNOSIS — F902 Attention-deficit hyperactivity disorder, combined type: Secondary | ICD-10-CM | POA: Diagnosis not present

## 2022-07-14 DIAGNOSIS — F902 Attention-deficit hyperactivity disorder, combined type: Secondary | ICD-10-CM | POA: Diagnosis not present

## 2022-07-14 DIAGNOSIS — Z79899 Other long term (current) drug therapy: Secondary | ICD-10-CM | POA: Diagnosis not present

## 2022-08-05 ENCOUNTER — Encounter: Payer: Self-pay | Admitting: Family Medicine

## 2022-08-05 ENCOUNTER — Other Ambulatory Visit: Payer: Self-pay | Admitting: Family Medicine

## 2022-08-08 NOTE — Telephone Encounter (Signed)
Vml for pt to cal back and sch

## 2022-08-08 NOTE — Telephone Encounter (Signed)
Patient is scheduled for OV on 08/15/22

## 2022-08-15 ENCOUNTER — Encounter: Payer: Self-pay | Admitting: Family Medicine

## 2022-08-15 ENCOUNTER — Ambulatory Visit (INDEPENDENT_AMBULATORY_CARE_PROVIDER_SITE_OTHER): Payer: BC Managed Care – PPO | Admitting: Family Medicine

## 2022-08-15 VITALS — BP 120/78 | HR 87 | Temp 98.1°F | Ht 67.0 in | Wt 224.4 lb

## 2022-08-15 DIAGNOSIS — F988 Other specified behavioral and emotional disorders with onset usually occurring in childhood and adolescence: Secondary | ICD-10-CM | POA: Diagnosis not present

## 2022-08-15 DIAGNOSIS — E785 Hyperlipidemia, unspecified: Secondary | ICD-10-CM | POA: Diagnosis not present

## 2022-08-15 DIAGNOSIS — E039 Hypothyroidism, unspecified: Secondary | ICD-10-CM | POA: Diagnosis not present

## 2022-08-15 DIAGNOSIS — R351 Nocturia: Secondary | ICD-10-CM

## 2022-08-15 DIAGNOSIS — N401 Enlarged prostate with lower urinary tract symptoms: Secondary | ICD-10-CM

## 2022-08-15 MED ORDER — AZITHROMYCIN 500 MG PO TABS: ORAL_TABLET | ORAL | 0 refills | Status: DC

## 2022-08-15 MED ORDER — TADALAFIL 5 MG PO TABS: 5.0000 mg | ORAL_TABLET | Freq: Every day | ORAL | 11 refills | Status: DC

## 2022-08-15 MED ORDER — ALPRAZOLAM 0.5 MG PO TABS: 0.5000 mg | ORAL_TABLET | Freq: Two times a day (BID) | ORAL | 0 refills | Status: DC | PRN

## 2022-08-15 NOTE — Progress Notes (Signed)
Phone [phone] In person visit   Subjective:   Luke Rice is a 62 y.o. year old very pleasant male patient who presents for/with See problem oriented charting Chief Complaint  Patient presents with   Discuss medications    Traveling internationally soon.   Discuss flu and shingles vaccine    To make a decision.   Past Medical History-  Patient Active Problem List   Diagnosis Date Noted   CAD S/P percutaneous coronary angioplasty 10/21/2014    Priority: High   Psoriasis     Priority: High   Attention deficit disorder (ADD) in adult 08/15/2022    Priority: Medium    Mild aortic stenosis 03/07/2021    Priority: Medium    Insomnia 02/10/2015    Priority: Medium    Dyslipidemia 10/21/2014    Priority: Medium    Hypothyroidism     Priority: Medium    Erectile dysfunction 10/10/2019    Priority: Low   RBBB     Priority: Low   Obesity 01/22/2014    Priority: Low   Migraine 01/22/2014    Priority: Low   Plantar fasciitis of left foot     Priority: Low   BPH (benign prostatic hyperplasia)     Priority: Low    Medications- reviewed and updated Current Outpatient Medications  Medication Sig Dispense Refill   ALPRAZolam (XANAX) 0.5 MG tablet Take 1 tablet (0.5 mg total) by mouth 2 (two) times daily as needed for anxiety (related to travel. do not use within 12 hours of adderall. do not drive for 8 hours after taking). 20 tablet 0   amphetamine-dextroamphetamine (ADDERALL) 10 MG tablet Take 10 mg by mouth 2 (two) times daily as needed (attention from psychiatry).     aspirin 81 MG tablet Take 81 mg by mouth daily.     atorvastatin (LIPITOR) 80 MG tablet TAKE 1/2 TABLET BY MOUTH EVERY EVENING 90 tablet 1   azithromycin (ZITHROMAX) 500 MG tablet Take once daily for travelers diarrhea- severe diarrhea characterized by fever and blood, pus, or mucus in the stool. 3 tablet 0   levothyroxine (SYNTHROID) 75 MCG tablet TAKE 1 TABLET BY MOUTH EVERY DAY 90 tablet 0   metoprolol  succinate (TOPROL-XL) 25 MG 24 hr tablet TAKE 1 TABLET BY MOUTH EVERY DAY 90 tablet 3   tadalafil (CIALIS) 5 MG tablet Take 1 tablet (5 mg total) by mouth daily. 30 tablet 11   No current facility-administered medications for this visit.     Objective:  BP 120/78 (BP Location: Right Arm, Patient Position: Sitting)   Pulse 87   Temp 98.1 F (36.7 C) (Temporal)   Ht 5' 7"$  (1.702 m)   Wt 224 lb 6.4 oz (101.8 kg)   SpO2 96%   BMI 35.15 kg/m  Gen: NAD, resting comfortably CV: RRR Lungs: CTAB no crackles, wheeze, rhonchi Ext: no edema Skin: warm, dry    Assessment and Plan   # Health maintenance-discussed flu and Shingrix vaccination- declines flu, shingrix shot on return from [place]   # Foreign travel [place] the world on upcoming trip. Worries about travelers diarrhea. Not in any one location for too long- does not want malaria prophylaxis. Alprazolam helpful on flights in past A/P: Patient request prescription for traveler's diarrhea-this was provided and indications for use were discussed. -For anxiety with flights prescribed alprazolam which he had used years ago and had good success without dependence  # BPH S:Medication: none - tamsulosin 0.4 mg- stopped as had lightheadedness with  standing/orthostatic symptoms - nocturia 1-2x a night, dribbling issues more common if rushed, sometimes feels hesitancy Lab Results  Component Value Date   PSA1 1.0 02/01/2021   PSA 0.84 07/11/2019   PSA 0.83 05/23/2018   PSA 0.53 05/22/2017  A/P: BPH poorly controlled.  Did not tolerate tamsulosin.  Has done well with tadalafil 20 mg for erectile dysfunction so we think he can most likely tolerate 5 mg daily-will send this in for him today-can complete prior authorization if needed  #Coronary artery disease with history of stent in 2016-sees Dr. [place] #Ischemic cardiomyopathy-thankfully EF recovered #Hyperlipidemia S: Medication: Aspirin 81 mg and atorvastatin 40 mg (2-3 doses a  week lately- has been missing more) . Also on metoprolol 25 mg XR.  Nitroglycerin on hand but hasn't taken in 5 years - knows not to take if we start cialis regularly-he had not planned on taking all prescription anyway and does not want new prescription Symptoms: No chest pain shortness of breath Lab Results  Component Value Date   CHOL 135 02/01/2021   HDL 49 02/01/2021   LDLCALC 67 02/01/2021   LDLDIRECT 61 11/24/2017   TRIG 100 02/01/2021   CHOLHDL 2.8 02/01/2021    A/P: CAD asymptomatic-continue current medication and cardiology follow-up though we will check cholesterol and if LDL above 70 need to reinforce consistency with atorvastatin  #hypothyroidism S: compliant On thyroid medication-levothyroxine 75 mcg  Lab Results  Component Value Date   TSH 4.92 09/06/2021  A/P: Hopefuly stable or improved- update TSH with labs today. Continue current meds for now.   #Mild aortic stenosis S: Stable on echocardiogram September 2022  A/P: Patient plans to schedule follow-up with cardiology and echocardiogram may be updated at that time or at least every 3 to 5 years   #Adult ADD_ working with psychiatry adderall 10 mg in AM and 20% of time takes another 10 mg at 1-2 pm -time released caused insomnia -He reports to me that he discussed this with cardiology before starting  Recommended follow up: Return in about 4 months (around 12/14/2022) for physical or sooner if needed.Schedule b4 you leave. Future Appointments  Date Time Provider [place]  09/07/2022  8:30 AM [facility] LAB [facility]  09/07/2022 10:00 AM [facility] NURSE [facility]  12/15/2022 10:00 AM Marin Olp, MD [facility]    Lab/Order associations:   ICD-10-CM   1. Hypothyroidism, unspecified type  E03.9 TSH    2. Dyslipidemia  E78.5 CBC with Differential/Platelet    Comprehensive metabolic panel    Lipid panel    3. Benign prostatic hyperplasia with nocturia  N40.1 PSA   R35.1     4. Attention  deficit disorder (ADD) in adult  F98.8       Meds ordered this encounter  Medications   tadalafil (CIALIS) 5 MG tablet    Sig: Take 1 tablet (5 mg total) by mouth daily.    Dispense:  30 tablet    Refill:  11   azithromycin (ZITHROMAX) 500 MG tablet    Sig: Take once daily for travelers diarrhea- severe diarrhea characterized by fever and blood, pus, or mucus in the stool.    Dispense:  3 tablet    Refill:  0   ALPRAZolam (XANAX) 0.5 MG tablet    Sig: Take 1 tablet (0.5 mg total) by mouth 2 (two) times daily as needed for anxiety (related to travel. do not use within 12 hours of adderall. do not drive for 8 hours after taking).  Dispense:  20 tablet    Refill:  0    Return precautions advised.  Garret Reddish, MD

## 2022-08-15 NOTE — Patient Instructions (Addendum)
Nurse visit here or pharmacy for shingrix on return  Wish you the best on travels- azithromycin for travelers diarrhea  Trial cialis 5 mg daily  Schedule a lab visit at the check out desk within 2 weeks. Return for future fasting labs meaning nothing but water after midnight please. Ok to take your medications with water.   Recommended follow up: Return in about 4 months (around 12/14/2022) for physical or sooner if needed.Schedule b4 you leave.

## 2022-08-15 NOTE — Assessment & Plan Note (Signed)
#   BPH S:Medication: none - tamsulosin 0.4 mg- stopped as had lightheadedness with standing/orthostatic symptoms - nocturia 1-2x a night, dribbling issues more common if rushed, sometimes feels hesitancy Lab Results  Component Value Date   PSA1 1.0 02/01/2021   PSA 0.84 07/11/2019   PSA 0.83 05/23/2018   PSA 0.53 05/22/2017  A/P: BPH poorly controlled.  Did not tolerate tamsulosin.  Has done well with tadalafil 20 mg for erectile dysfunction so we think he can most likely tolerate 5 mg daily-will send this in for him today-can complete prior authorization if needed

## 2022-08-15 NOTE — Assessment & Plan Note (Signed)
#  Adult ADD_ working with psychiatry adderall 10 mg in AM and 20% of time takes another 10 mg at 1-2 pm -time released caused insomnia -He reports to me that he discussed this with cardiology before starting

## 2022-09-06 ENCOUNTER — Other Ambulatory Visit (INDEPENDENT_AMBULATORY_CARE_PROVIDER_SITE_OTHER): Payer: BC Managed Care – PPO

## 2022-09-06 DIAGNOSIS — N401 Enlarged prostate with lower urinary tract symptoms: Secondary | ICD-10-CM | POA: Diagnosis not present

## 2022-09-06 DIAGNOSIS — E039 Hypothyroidism, unspecified: Secondary | ICD-10-CM

## 2022-09-06 DIAGNOSIS — R351 Nocturia: Secondary | ICD-10-CM | POA: Diagnosis not present

## 2022-09-06 DIAGNOSIS — E785 Hyperlipidemia, unspecified: Secondary | ICD-10-CM

## 2022-09-06 LAB — COMPREHENSIVE METABOLIC PANEL
ALT: 30 U/L (ref 0–53)
AST: 22 U/L (ref 0–37)
Albumin: 4.3 g/dL (ref 3.5–5.2)
Alkaline Phosphatase: 52 U/L (ref 39–117)
BUN: 12 mg/dL (ref 6–23)
CO2: 28 mEq/L (ref 19–32)
Calcium: 10.2 mg/dL (ref 8.4–10.5)
Chloride: 103 mEq/L (ref 96–112)
Creatinine, Ser: 1.04 mg/dL (ref 0.40–1.50)
GFR: 78.47 mL/min (ref >60.00)
Glucose, Bld: 110 mg/dL — ABNORMAL HIGH (ref 70–99)
Potassium: 4.9 mEq/L (ref 3.5–5.1)
Sodium: 142 mEq/L (ref 135–145)
Total Bilirubin: 0.8 mg/dL (ref 0.2–1.2)
Total Protein: 7.2 g/dL (ref 6.0–8.3)

## 2022-09-06 LAB — CBC WITH DIFFERENTIAL/PLATELET
Basophils Absolute: 0 10*3/uL (ref 0.0–0.1)
Basophils Relative: 0.7 % (ref 0.0–3.0)
Eosinophils Absolute: 0.1 10*3/uL (ref 0.0–0.7)
Eosinophils Relative: 1.4 % (ref 0.0–5.0)
HCT: 48.3 % (ref 39.0–52.0)
Hemoglobin: 15.9 g/dL (ref 13.0–17.0)
Lymphocytes Relative: 28.6 % (ref 12.0–46.0)
Lymphs Abs: 1.9 10*3/uL (ref 0.7–4.0)
MCHC: 32.9 g/dL (ref 30.0–36.0)
MCV: 85.4 fl (ref 78.0–100.0)
Monocytes Absolute: 0.7 10*3/uL (ref 0.1–1.0)
Monocytes Relative: 11.1 % (ref 3.0–12.0)
Neutro Abs: 3.8 10*3/uL (ref 1.4–7.7)
Neutrophils Relative %: 58.2 % (ref 43.0–77.0)
Platelets: 294 10*3/uL (ref 150.0–400.0)
RBC: 5.65 Mil/uL (ref 4.22–5.81)
RDW: 14.6 % (ref 11.5–15.5)
WBC: 6.5 10*3/uL (ref 4.0–10.5)

## 2022-09-06 LAB — LIPID PANEL
Cholesterol: 214 mg/dL — ABNORMAL HIGH (ref 0–200)
HDL: 53.7 mg/dL (ref >39.00)
LDL Cholesterol: 129 mg/dL — ABNORMAL HIGH (ref 0–99)
NonHDL: 160.63
Total CHOL/HDL Ratio: 4
Triglycerides: 159 mg/dL — ABNORMAL HIGH (ref 0.0–149.0)
VLDL: 31.8 mg/dL (ref 0.0–40.0)

## 2022-09-06 LAB — TSH: TSH: 7.42 u[IU]/mL — ABNORMAL HIGH (ref 0.35–5.50)

## 2022-09-06 LAB — PSA: PSA: 1.02 ng/mL (ref 0.10–4.00)

## 2022-09-07 ENCOUNTER — Other Ambulatory Visit: Payer: BC Managed Care – PPO

## 2022-09-07 ENCOUNTER — Ambulatory Visit: Payer: BC Managed Care – PPO

## 2022-12-14 DIAGNOSIS — L400 Psoriasis vulgaris: Secondary | ICD-10-CM | POA: Diagnosis not present

## 2022-12-14 DIAGNOSIS — L570 Actinic keratosis: Secondary | ICD-10-CM | POA: Diagnosis not present

## 2022-12-14 DIAGNOSIS — D225 Melanocytic nevi of trunk: Secondary | ICD-10-CM | POA: Diagnosis not present

## 2022-12-14 DIAGNOSIS — L820 Inflamed seborrheic keratosis: Secondary | ICD-10-CM | POA: Diagnosis not present

## 2022-12-14 DIAGNOSIS — L298 Other pruritus: Secondary | ICD-10-CM | POA: Diagnosis not present

## 2022-12-14 DIAGNOSIS — R208 Other disturbances of skin sensation: Secondary | ICD-10-CM | POA: Diagnosis not present

## 2022-12-15 ENCOUNTER — Other Ambulatory Visit: Payer: Self-pay | Admitting: Family Medicine

## 2022-12-15 ENCOUNTER — Encounter: Payer: Self-pay | Admitting: Family Medicine

## 2022-12-15 ENCOUNTER — Ambulatory Visit (INDEPENDENT_AMBULATORY_CARE_PROVIDER_SITE_OTHER): Payer: BC Managed Care – PPO | Admitting: Family Medicine

## 2022-12-15 VITALS — BP 112/74 | HR 58 | Temp 97.8°F | Ht 67.0 in | Wt 227.8 lb

## 2022-12-15 DIAGNOSIS — Z9861 Coronary angioplasty status: Secondary | ICD-10-CM | POA: Diagnosis not present

## 2022-12-15 DIAGNOSIS — Z0000 Encounter for general adult medical examination without abnormal findings: Secondary | ICD-10-CM | POA: Diagnosis not present

## 2022-12-15 DIAGNOSIS — R5383 Other fatigue: Secondary | ICD-10-CM | POA: Diagnosis not present

## 2022-12-15 DIAGNOSIS — R739 Hyperglycemia, unspecified: Secondary | ICD-10-CM

## 2022-12-15 DIAGNOSIS — Z23 Encounter for immunization: Secondary | ICD-10-CM

## 2022-12-15 DIAGNOSIS — E039 Hypothyroidism, unspecified: Secondary | ICD-10-CM

## 2022-12-15 DIAGNOSIS — E785 Hyperlipidemia, unspecified: Secondary | ICD-10-CM

## 2022-12-15 DIAGNOSIS — I2510 Atherosclerotic heart disease of native coronary artery without angina pectoris: Secondary | ICD-10-CM | POA: Diagnosis not present

## 2022-12-15 DIAGNOSIS — Z131 Encounter for screening for diabetes mellitus: Secondary | ICD-10-CM

## 2022-12-15 LAB — CBC WITH DIFFERENTIAL/PLATELET
Basophils Absolute: 0 10*3/uL (ref 0.0–0.1)
Basophils Relative: 0.5 % (ref 0.0–3.0)
Eosinophils Absolute: 0.1 10*3/uL (ref 0.0–0.7)
Eosinophils Relative: 1.1 % (ref 0.0–5.0)
HCT: 46.1 % (ref 39.0–52.0)
Hemoglobin: 14.9 g/dL (ref 13.0–17.0)
Lymphocytes Relative: 24 % (ref 12.0–46.0)
Lymphs Abs: 1.6 10*3/uL (ref 0.7–4.0)
MCHC: 32.2 g/dL (ref 30.0–36.0)
MCV: 85.9 fl (ref 78.0–100.0)
Monocytes Absolute: 0.5 10*3/uL (ref 0.1–1.0)
Monocytes Relative: 8.1 % (ref 3.0–12.0)
Neutro Abs: 4.5 10*3/uL (ref 1.4–7.7)
Neutrophils Relative %: 66.3 % (ref 43.0–77.0)
Platelets: 281 10*3/uL (ref 150.0–400.0)
RBC: 5.37 Mil/uL (ref 4.22–5.81)
RDW: 14.2 % (ref 11.5–15.5)
WBC: 6.8 10*3/uL (ref 4.0–10.5)

## 2022-12-15 LAB — LIPID PANEL
Cholesterol: 184 mg/dL (ref 0–200)
HDL: 46.1 mg/dL (ref >39.00)
LDL Cholesterol: 120 mg/dL — ABNORMAL HIGH (ref 0–99)
NonHDL: 137.67
Total CHOL/HDL Ratio: 4
Triglycerides: 87 mg/dL (ref 0.0–149.0)
VLDL: 17.4 mg/dL (ref 0.0–40.0)

## 2022-12-15 LAB — COMPREHENSIVE METABOLIC PANEL
ALT: 26 U/L (ref 0–53)
AST: 18 U/L (ref 0–37)
Albumin: 4.1 g/dL (ref 3.5–5.2)
Alkaline Phosphatase: 46 U/L (ref 39–117)
BUN: 13 mg/dL (ref 6–23)
CO2: 25 mEq/L (ref 19–32)
Calcium: 9.1 mg/dL (ref 8.4–10.5)
Chloride: 106 mEq/L (ref 96–112)
Creatinine, Ser: 0.89 mg/dL (ref 0.40–1.50)
GFR: 93.48 mL/min (ref >60.00)
Glucose, Bld: 112 mg/dL — ABNORMAL HIGH (ref 70–99)
Potassium: 4.5 mEq/L (ref 3.5–5.1)
Sodium: 139 mEq/L (ref 135–145)
Total Bilirubin: 0.5 mg/dL (ref 0.2–1.2)
Total Protein: 7 g/dL (ref 6.0–8.3)

## 2022-12-15 LAB — VITAMIN B12: Vitamin B-12: 227 pg/mL (ref 211–911)

## 2022-12-15 LAB — TSH: TSH: 2.92 u[IU]/mL (ref 0.35–5.50)

## 2022-12-15 LAB — HEMOGLOBIN A1C: Hgb A1c MFr Bld: 6 % (ref 4.6–6.5)

## 2022-12-15 LAB — VITAMIN D 25 HYDROXY (VIT D DEFICIENCY, FRACTURES): VITD: 21.65 ng/mL — ABNORMAL LOW (ref 30.00–100.00)

## 2022-12-15 MED ORDER — VITAMIN D (ERGOCALCIFEROL) 1.25 MG (50000 UNIT) PO CAPS
50000.0000 [IU] | ORAL_CAPSULE | ORAL | 0 refills | Status: DC
Start: 2022-12-15 — End: 2024-02-12

## 2022-12-15 NOTE — Progress Notes (Signed)
Phone: [phone]   Subjective:  Patient presents today for their annual physical. Chief complaint-noted.   See problem oriented charting- ROS- full  review of systems was completed and negative  except for: fatigue issues  The following were reviewed and entered/updated in epic: Past Medical History:  Diagnosis Date   BPH (benign prostatic hyperplasia)    describ[place], not currently as bothresome   Coronary artery disease    a.  LHC 10/20/14 s/p PTCA/DES to prox RCA, FFR of mLAD (found to be not hemodynamically significant), also occluded distal LCx prior to terminal small OM branch with L-L collaterals. Residual dz for med rx.   Dyslipidemia    Hyperglycemia    a. A1C 6.1 in 09/2014.   Hypothyroidism    Ischemic cardiomyopathy    a. EF 45% by cath 09/2014.   Obesity    Plantar fasciitis of left foot    Psoriasis    RBBB    Renal cyst 01/22/2014   17mm left kidney. Bozniak Class I in discussion with [facility]. No further follow up.     Patient Active Problem List   Diagnosis Date Noted   CAD S/P percutaneous coronary angioplasty 10/21/2014    Priority: High   Psoriasis     Priority: High   Attention deficit disorder (ADD) in adult 08/15/2022    Priority: Medium    Mild aortic stenosis 03/07/2021    Priority: Medium    Insomnia 02/10/2015    Priority: Medium    Dyslipidemia 10/21/2014    Priority: Medium    Hypothyroidism     Priority: Medium    Erectile dysfunction 10/10/2019    Priority: Low   RBBB     Priority: Low   Obesity 01/22/2014    Priority: Low   Migraine 01/22/2014    Priority: Low   Plantar fasciitis of left foot     Priority: Low   BPH (benign prostatic hyperplasia)     Priority: Low   Past Surgical History:  Procedure Laterality Date   CARDIAC CATHETERIZATION N/A 10/20/2014   Procedure: CORONARY STENT INTERVENTION;  Surgeon: Peter M Swazil[place], MD;  Location: [facility] CATH LAB;  Service:  Cardiovascular;  Laterality: N/A;   COLONOSCOPY     30s. concern for diverticulitis.    CORONARY ANGIOPLASTY WITH STENT PLACEMENT  10/20/2014   "1"   FRACTIONAL FLOW RESERVE WIRE  10/20/2014   Procedure: FRACTIONAL FLOW RESERVE WIRE;  Surgeon: Peter M [place], MD;  Location: [facility] CATH LAB;  Service: Cardiovascular;;   LEFT HEART CATHETERIZATION WITH CORONARY ANGIOGRAM N/A 10/20/2014   Procedure: LEFT HEART CATHETERIZATION WITH CORONARY ANGIOGRAM;  Surgeon: Peter M Swaz[place], MD;  Location: [facility] CATH LAB;  Service: Cardiovascular;  Laterality: N/A;    Family History  Problem Relation Age of Onset   Hypothyroidism Mother    Lung cancer Other        father and grand father but both smokers. Both deceased due to this.    Diabetes Brother    Hypothyroidism Sister    Bipolar disorder Brother    Colon cancer Neg Hx    Rectal cancer Neg Hx    Stomach cancer Neg Hx     Medications- reviewed and updated Current Outpatient Medications  Medication Sig Dispense Refill   ALPRAZolam (XANAX) 0.5 MG tablet Take 1 tablet (0.5 mg total) by mouth 2 (two) times daily as needed for anxiety (related to travel. do not use within 12 hours of adderall. do  not drive for 8 hours after taking). 20 tablet 0   amphetamine-dextroamphetamine (ADDERALL) 10 MG tablet Take 10 mg by mouth 2 (two) times daily as needed (attention from psychiatry).     aspirin 81 MG tablet Take 81 mg by mouth daily.     atorvastatin (LIPITOR) 80 MG tablet TAKE 1/2 TABLET BY MOUTH EVERY EVENING 90 tablet 1   levothyroxine (SYNTHROID) 75 MCG tablet TAKE 1 TABLET BY MOUTH EVERY DAY 90 tablet 0   metoprolol succinate (TOPROL-XL) 25 MG 24 hr tablet TAKE 1 TABLET BY MOUTH EVERY DAY 90 tablet 3   tadalafil (CIALIS) 5 MG tablet Take 1 tablet (5 mg total) by mouth daily. 30 tablet 11   No current facility-administered medications for this visit.    Allergies-reviewed and updated No Known Allergies  Social History   Social History Narrative    Married for 26 years in 2015. 2 kids- 22 daughter at [facility] 20 son at [employer] 04/2017.    Hobbies- yardwork, family time, travel-[place], [place]; sports-pro football and college basketball, music.       Looking for work now- was tired of international work   DID work with [employer] in [employer] now working in [employer]. 26 years-lots of travel previously lived in [place], [place] (12 years). Just relocated to [facility] [place] [facility] 12/2013.    Objective  Objective:  BP 112/74   Pulse (!) 58   Temp 97.8 F (36.6 C)   Ht 5\' 7"  (1.702 m)   Wt 227 lb 12.8 oz (103.3 kg)   SpO2 99%   BMI 35.68 kg/m  Gen: NAD, resting comfortably HEENT: Mucous membranes are moist. Oropharynx normal Neck: no thyromegaly CV: RRR no murmurs rubs or gallops Lungs: CTAB no crackles, wheeze, rhonchi Abdomen: soft/nontender/nondistended/normal bowel sounds. No rebound or guarding.  Ext: no edema Skin: warm, dry Neuro: grossly normal, moves all extremities, PERRLA    Assessment and Plan  62 y.o. male presenting for annual physical.  Health Maintenance counseling: 1. Anticipatory guidance: Patient counseled regarding regular dental exams -q6 months, eye exams - no recent visit- considering,  avoiding smoking and second hand smoke , limiting alcohol to 2 beverages per day , no illicit drugs .   2. Risk factor reduction:  Advised patient of need for regular exercise and diet rich and fruits and vegetables to reduce risk of heart attack and stroke.  Exercise- low lately- plans to try to work on this- long term goal 150 minutes a week but even 5-10 minutes daily and working up is reasonable.  Diet/weight management-down 1 pounds from last year- encouraged continued weight loss.  Wt Readings from Last 3 Encounters:  12/15/22 227 lb 12.8 oz (103.3 kg)  08/15/22 224 lb 6.4 oz (101.8 kg)  12/03/21 228 lb 6.4 oz (103.6 kg)  3. Immunizations/screenings/ancillary studies- shingrix #1 today- repeat planned next visit-  still on private pay Immunization History  Administered Date(s) Administered   Hep A / Hep B 04/06/2018, 05/23/2018   Influenza,inj,Quad PF,6+ Mos 05/18/2016, 05/22/2017, 03/29/2018, 04/02/2019   Influenza-Unspecified 02/05/2015   MMR 04/06/2018   PFIZER(Purple Top)SARS-COV-2 Vaccination 08/27/2019, 09/20/2019   PPD Test 04/09/2014   Tdap 04/09/2014   Zoster Recombinat (Shingrix) 12/15/2022  4. Prostate cancer screening-  low risk trend just checked- hold off on repeat  Lab Results  Component Value Date   PSA 1.02 09/06/2022   PSA 0.84 07/11/2019   PSA 0.83 05/23/2018   5. Colon cancer screening - February 2016 with 10 year  repeat 6. Skin cancer screening- Dr. Judithann Sheen in [place] for psoriasis- just seen yesterday. Had Ak frozen yesterday. advised regular sunscreen use. Denies worrisome, changing, or new skin lesions.  7. Smoking associated screening (lung cancer screening, AAA screen 65-75, UA)- never smoker 8. STD screening - only active with wife  Status of chronic or acute concerns   #Coronary artery disease with history of stent in 2016-sees Dr. [place] #Ischemic cardiomyopathy-thankfully EF recovered #Hyperlipidemia S: Medication: Aspirin 81 mg and atorvastatin 40 mg-half of 80 mg (has trialed off in last few months and wants to see how #s look- high last time but had just been off for short while- has not made dietary change) . Also on metoprolol 25 mg XR.  Nitroglycerin in past Symptoms: no chest pain or shortness of breath   Lab Results  Component Value Date   CHOL 214 (H) 09/06/2022   HDL 53.70 09/06/2022   LDLCALC 129 (H) 09/06/2022   LDLDIRECT 61 11/24/2017   TRIG 159.0 (H) 09/06/2022   CHOLHDL 4 09/06/2022  A/P: CAD asymptomatic . Likely restart statin- plans to follow up with DrMarland Kitchen [place] Lipid suspect still high- likely needs to restart atorvastatin- he wants to see #s first before he decides.  -was off thyroid medicine last visit - no recurrence of symptoms from  cardiomyopathy  #hypothyroidism S: compliant On thyroid medication-levothyroxine 75 mcg- was off last visit- states more sporadic than consistent Lab Results  Component Value Date   TSH 7.42 (H) 09/06/2022  A/P:update TSH- likely need to focus on consistency    #Mild aortic stenosis S: Stable on echocardiogram September 2022  -2-5  year repeat planned for mild. No lightheadedness, chest pain, or osb  A/P: stable without symptoms- continue to monitor    #Psoriasis S: Dr. Kelby Fam out of [place]-  low dose cortisone pricks if bothers him and really helpful A/P: reasonable control- continue follow up    # Hyperglycemia/insulin resistance/prediabetes- a1c max 6.2 S:  Medication: none Lab Results  Component Value Date   HGBA1C 6.2 09/06/2021   HGBA1C 6.1 07/11/2019   HGBA1C 6.1 05/23/2018  A/P: hopefully stable- update a1c today. Continue without meds for now   # BPH/ED S:Medication: tadalafil 5 mg daily - tamsulosin 0.4 mg- stopped as had lightheadedness with standing/orthostatic symptoms A/P: symptoms better on daily dosage as compared to prior tamsulosin plus with lower side effects - Ed not much better unfortunately.    #attention deficit disorder- on Adderall 10 mg twice daily through psychiatry - mainly just once a day if at all - didn't see substantial benefit so has pulled back anymore  #Fatigue- reports ongoing fatigue issues- agrees to check B12 and vitamin D   Recommended follow up: Return in about 6 months (around 06/16/2023) for followup or sooner if needed.Schedule b4 you leave.  Lab/Order associations: fasting   ICD-10-CM   1. Need for shingles vaccine  Z23 Zoster Recombinant (Shingrix )    2. Routine general medical examination at a health care facility  Z00.00 Hemoglobin A1c    3. CAD S/P percutaneous coronary angioplasty  I25.10 Comprehensive metabolic panel   Z61.09 CBC with Differential/Platelet    Lipid panel    4. Hypothyroidism, unspecified type   E03.9 TSH    5. Dyslipidemia  E78.5 Comprehensive metabolic panel    CBC with Differential/Platelet    Lipid panel    6. Hyperglycemia  R73.9 Hemoglobin A1c    7. Screening for diabetes mellitus  Z13.1 Hemoglobin A1c  8. Fatigue, unspecified type  R53.83 Vitamin B12    VITAMIN D 25 Hydroxy (Vit-D Deficiency, Fractures)      No orders of the defined types were placed in this encounter.   Return precautions advised.  Tana Conch, MD

## 2022-12-15 NOTE — Patient Instructions (Addendum)
Please stop by lab before you go If you have mychart- we will send your results within 3 business days of [place] receiving them.  If you do not have mychart- we will call you about results within 5 business days of [place] receiving them.  *please also note that you will see labs on mychart as soon as they post. I will later go in and write notes on them- will say "notes from Dr. Durene Cal"   I would recommend bumping the exercise up but also working on dietary changes to help with gradual weight loss   I suggest myfitnesspal Use 0.5 pounds per week weight loss goal Set a reasonable goal such as 5-10 lbs and can reset goal once you reach out Do not connect your step counter to this- watch or phone Update me in 2-3 months with how you are doing  Recommended follow up: Return in about 6 months (around 06/16/2023) for followup or sooner if needed.Schedule b4 you leave.

## 2023-03-25 NOTE — Progress Notes (Signed)
Seen by casting department Dr. Logan Bores patient

## 2023-05-18 DIAGNOSIS — H25813 Combined forms of age-related cataract, bilateral: Secondary | ICD-10-CM | POA: Diagnosis not present

## 2023-05-28 ENCOUNTER — Other Ambulatory Visit: Payer: Self-pay | Admitting: Family Medicine

## 2023-08-27 ENCOUNTER — Other Ambulatory Visit: Payer: Self-pay | Admitting: Family Medicine

## 2023-08-28 ENCOUNTER — Other Ambulatory Visit: Payer: Self-pay | Admitting: Family Medicine

## 2023-08-28 NOTE — Telephone Encounter (Signed)
 Called and spoke with [place] at [facility] and gave VO.

## 2023-09-07 ENCOUNTER — Other Ambulatory Visit: Payer: Self-pay | Admitting: Family Medicine

## 2023-09-07 ENCOUNTER — Other Ambulatory Visit: Payer: Self-pay | Admitting: Cardiology

## 2023-09-30 ENCOUNTER — Other Ambulatory Visit: Payer: Self-pay | Admitting: Cardiology

## 2023-10-06 NOTE — Progress Notes (Deleted)
 Cardiology Office Note   Date:  10/06/2023   ID:  Luke Rice, DOB 24-Aug-1960, MRN [id number]  PCP:  Luke Majestic, MD  Cardiologist:  Dr. [place]   History of Present Illness: Luke Rice is a 63 y.o. male with a history of CAD s/p PTCA/DES to prox RCA (09/2014), ischemic cardiomyopathy EF 45, HLD, hypothyroidism, chronic RBBB, morbid obesity and psoriasis who presents for follow up.  He presented in 2016 with symptoms of angina.  Nuc results were abnormal with  reversibility along the inferolateral wall near the apex and mid segment, findings concerning for stress-induced ischemia, mild diffuse HK, EF 44%.  Cardiac cath 10/20/14 which revealed: 100% occlusion of the proximal RCA with collaterals. The distal LCx was also occluded. There was a 50-60% mid LAD stenosis with normal FFR. He  underwent DES to RCA.  Medical therapy was recommended for residual CAD in PDA/PLOM/ and distal LCx OM. He had a follow up stress Myoview in Dec 2016 which showed a small area of ischemia in the basal inferior wall c/w LCx occlusion. Perfusion was markedly improved from prior study. No ischemia in LAD territory. EF returned to normal on follow up Echo and Myoview.  He was seen there in September 2017 and had an ETT and Echo done. The Echo showed normal LV function with EF 59%. Normal valves. On ETT he reached 10 mets with no angina and no ST changes. Repeat ETT in August 2020 was normal.   On follow up today he is doing very well. No chest pain or dyspnea. He has joined [employer]. Lost 5 lbs. Is planning to see psychiatry for treatment of ADDHD.    Past Medical History:  Diagnosis Date   BPH (benign prostatic hyperplasia)    [place], not currently as bothresome   Coronary artery disease    a.  LHC 10/20/14 s/p PTCA/DES to prox RCA, FFR of mLAD (found to be not hemodynamically significant), also occluded distal LCx prior to terminal small OM branch with L-L  collaterals. Residual dz for med rx.   Dyslipidemia    Hyperglycemia    a. A1C 6.1 in 09/2014.   Hypothyroidism    Ischemic cardiomyopathy    a. EF 45% by cath 09/2014.   Obesity    Plantar fasciitis of left foot    Psoriasis    RBBB    Renal cyst 01/22/2014   17mm left kidney. Bozniak Class I in discussion with [facility]. No further follow up.      Past Surgical History:  Procedure Laterality Date   CARDIAC CATHETERIZATION N/A 10/20/2014   Procedure: CORONARY STENT INTERVENTION;  Surgeon: Luke Girgis M [place], MD;  Location: [facility] CATH LAB;  Service: Cardiovascular;  Laterality: N/A;   COLONOSCOPY     30s. concern for diverticulitis.    CORONARY ANGIOPLASTY WITH STENT PLACEMENT  10/20/2014   "1"   FRACTIONAL FLOW RESERVE WIRE  10/20/2014   Procedure: FRACTIONAL FLOW RESERVE WIRE;  Surgeon: Luke Tye M [place], MD;  Location: [facility] CATH LAB;  Service: Cardiovascular;;   LEFT HEART CATHETERIZATION WITH CORONARY ANGIOGRAM N/A 10/20/2014   Procedure: LEFT HEART CATHETERIZATION WITH CORONARY ANGIOGRAM;  Surgeon: Luke Sens M [place], MD;  Location: [facility] CATH LAB;  Service: Cardiovascular;  Laterality: N/A;     Current Outpatient Medications  Medication Sig Dispense Refill   ALPRAZolam (XANAX) 0.5 MG tablet Take 1 tablet (0.5 mg total) by mouth 2 (two) times daily as needed for anxiety (related  to travel. do not use within 12 hours of adderall. do not drive for 8 hours after taking). 20 tablet 0   amphetamine-dextroamphetamine (ADDERALL) 10 MG tablet Take 10 mg by mouth 2 (two) times daily as needed (attention from psychiatry).     aspirin 81 MG tablet Take 81 mg by mouth daily.     atorvastatin (LIPITOR) 80 MG tablet TAKE 1/2 TABLET BY MOUTH EVERY EVENING 90 tablet 1   levothyroxine (SYNTHROID) 75 MCG tablet TAKE 1 TABLET BY MOUTH EVERY DAY 90 tablet 0   metoprolol succinate (TOPROL-XL) 25 MG 24 hr tablet Take 1 tablet (25 mg total) by mouth daily. 30 tablet 0   tadalafil (CIALIS) 5 MG tablet  TAKE 1 TABLET (5 MG TOTAL) BY MOUTH DAILY. 30 tablet 11   Vitamin D, Ergocalciferol, (DRISDOL) 1.25 MG (50000 UNIT) CAPS capsule Take 1 capsule (50,000 Units total) by mouth every 7 (seven) days. 12 capsule 0   No current facility-administered medications for this visit.    Allergies:   Patient has no known allergies.    Social History:  The patient  reports that he has never smoked. He has never used smokeless tobacco. He reports current alcohol use of about 4.0 standard drinks of alcohol per week. He reports that he does not use drugs.   Family History:  The patient's family history includes Bipolar disorder in his brother; Diabetes in his brother; Hypothyroidism in his mother and sister; Lung cancer in an other family member.    ROS:  Please see the history of present illness.   Otherwise, review of systems are positive for none.   All other systems are reviewed and negative.    PHYSICAL EXAM: VS:  There were no vitals taken for this visit. , BMI There is no height or weight on file to calculate BMI. GENERAL:  Well appearing, overweight WM in NAD HEENT:  PERRL, EOMI, sclera are clear. Oropharynx is clear. NECK:  No jugular venous distention, carotid upstroke brisk and symmetric, no bruits, no thyromegaly or adenopathy LUNGS:  Clear to auscultation bilaterally CHEST:  Unremarkable HEART:  RRR,  PMI not displaced or sustained,S1 and S2 within normal limits, no S3, no S4: no clicks, no rubs, gr 2/6 SEM radiating to carotids bilaterally.  ABD:  Soft, nontender. BS +, no masses or bruits. No hepatomegaly, no splenomegaly EXT:  2 + pulses throughout, no edema, no cyanosis no clubbing SKIN:  Warm and dry.  No rashes NEURO:  Alert and oriented x 3. Cranial nerves II through XII intact. PSYCH:  Cognitively intact   EKG:  EKG is  not ordered today.    Recent Labs: 12/15/2022: ALT 26; BUN 13; Creatinine, Ser 0.89; Hemoglobin 14.9; Platelets 281.0; Potassium 4.5; Sodium 139; TSH 2.92     Lipid Panel    Component Value Date/Time   CHOL 184 12/15/2022 1111   CHOL 135 02/01/2021 1006   TRIG 87.0 12/15/2022 1111   HDL 46.10 12/15/2022 1111   HDL 49 02/01/2021 1006   CHOLHDL 4 12/15/2022 1111   VLDL 17.4 12/15/2022 1111   LDLCALC 120 (H) 12/15/2022 1111   LDLCALC 67 02/01/2021 1006   LDLDIRECT 61 11/24/2017 1544     Lab Results  Component Value Date   WBC 6.8 12/15/2022   HGB 14.9 12/15/2022   HCT 46.1 12/15/2022   PLT 281.0 12/15/2022   GLUCOSE 112 (H) 12/15/2022   CHOL 184 12/15/2022   TRIG 87.0 12/15/2022   HDL 46.10 12/15/2022   LDLDIRECT  61 11/24/2017   LDLCALC 120 (H) 12/15/2022   ALT 26 12/15/2022   AST 18 12/15/2022   NA 139 12/15/2022   K 4.5 12/15/2022   CL 106 12/15/2022   CREATININE 0.89 12/15/2022   BUN 13 12/15/2022   CO2 25 12/15/2022   TSH 2.92 12/15/2022   PSA 1.02 09/06/2022   INR 1.01 10/18/2014   HGBA1C 6.0 12/15/2022     Wt Readings from Last 3 Encounters:  12/15/22 227 lb 12.8 oz (103.3 kg)  08/15/22 224 lb 6.4 oz (101.8 kg)  12/03/21 228 lb 6.4 oz (103.6 kg)      Other studies Reviewed: Additional studies/ records that were reviewed today include: none  ETT 02/12/19: Study Highlights  The patient walked for 9 minutes and 10 seconds on a standard Bruce protocol treadmill test He achieved a peak heart rate of 164 which is 92% predicted maximal heart rate. He had a right bundle branch block at baseline. The blood pressure response to exercise was normal. There were no ST or T wave changes to suggest ischemia at peak exercise. This is interpreted as a negative stress test. There is no evidence of ischemia.     ASSESSMENT AND PLAN:   1. CAD-  s/p PTCA/DES to prox RCA (09/2014) Myoview in Dec 2016 as noted above. Normal ETT in August 2020. He is asymptomatic.  -- Continue ASA, statin and BB.  -- Encourage more aerobic activity and weight loss  2. Ischemic CM- EF has recovered. Normal Echo in September 2017  3.  HLD- Continue statin with excellent control. Now on lower statin dose. Last LDL 67. Will have follow up labs with Dr Durene Cal  4. Mild aortic stenosis. Repeat Echo last year showed no change.    Disposition:   FU with me in one year   Signed, Claudy Abdallah [place], MD  10/06/2023 8:09 AM

## 2023-10-11 ENCOUNTER — Ambulatory Visit: Payer: BC Managed Care – PPO | Admitting: Cardiology

## 2023-10-16 ENCOUNTER — Other Ambulatory Visit: Payer: Self-pay | Admitting: Cardiology

## 2023-11-28 ENCOUNTER — Other Ambulatory Visit: Payer: Self-pay | Admitting: Family Medicine

## 2023-12-17 NOTE — Progress Notes (Unsigned)
 Cardiology Office Note   Date:  12/20/2023   ID:  Luke Rice, DOB 06-29-1960, MRN [id number]  PCP:  Katrinka Garnette KIDD, MD  Cardiologist:  Dr. [place]   History of Present Illness: Luke Rice is a 63 y.o. male with a history of CAD s/p PTCA/DES to prox RCA (09/2014), ischemic cardiomyopathy EF 45, HLD, hypothyroidism, chronic RBBB, morbid obesity and psoriasis who presents for follow up.  He presented in 2016 with symptoms of angina.  Nuc results were abnormal with  reversibility along the inferolateral wall near the apex and mid segment, findings concerning for stress-induced ischemia, mild diffuse HK, EF 44%.  Cardiac cath 10/20/14 which revealed: 100% occlusion of the proximal RCA with collaterals. The distal LCx was also occluded. There was a 50-60% mid LAD stenosis with normal FFR. He  underwent DES to RCA.  Medical therapy was recommended for residual CAD in PDA/PLOM/ and distal LCx OM. He had a follow up stress Myoview in Dec 2016 which showed a small area of ischemia in the basal inferior wall c/w LCx occlusion. Perfusion was markedly improved from prior study. No ischemia in LAD territory. EF returned to normal on follow up Echo and Myoview.  He was seen there in September 2017 and had an ETT and Echo done. The Echo showed normal LV function with EF 59%. Normal valves. On ETT he reached 10 mets with no angina and no ST changes. Repeat ETT in August 2020 was normal.   On follow up today he is doing OK. He is doing a lot of traveling with work. Hasn't found much time to exercise. Weight is stable. Has stopped taking lipitor  due to myalgias. Denies significant chest pain or dyspnea   Past Medical History:  Diagnosis Date   BPH (benign prostatic hyperplasia)    [place], not currently as bothresome   Coronary artery disease    a.  LHC 10/20/14 s/p PTCA/DES to prox RCA, FFR of mLAD (found to be not hemodynamically significant), also occluded distal  LCx prior to terminal small OM branch with L-L collaterals. Residual dz for med rx.   Dyslipidemia    Hyperglycemia    a. A1C 6.1 in 09/2014.   Hypothyroidism    Ischemic cardiomyopathy    a. EF 45% by cath 09/2014.   Obesity    Plantar fasciitis of left foot    Psoriasis    RBBB    Renal cyst 01/22/2014   17mm left kidney. Bozniak Class I in discussion with [facility]. No further follow up.      Past Surgical History:  Procedure Laterality Date   CARDIAC CATHETERIZATION N/A 10/20/2014   Procedure: CORONARY STENT INTERVENTION;  Surgeon: Alyx Mcguirk M [place], MD;  Location: [facility] CATH LAB;  Service: Cardiovascular;  Laterality: N/A;   COLONOSCOPY     30s. concern for diverticulitis.    CORONARY ANGIOPLASTY WITH STENT PLACEMENT  10/20/2014   1   FRACTIONAL FLOW RESERVE WIRE  10/20/2014   Procedure: FRACTIONAL FLOW RESERVE WIRE;  Surgeon: Brexley Cutshaw M [place], MD;  Location: [facility] CATH LAB;  Service: Cardiovascular;;   LEFT HEART CATHETERIZATION WITH CORONARY ANGIOGRAM N/A 10/20/2014   Procedure: LEFT HEART CATHETERIZATION WITH CORONARY ANGIOGRAM;  Surgeon: Natasha Burda M [place], MD;  Location: [facility] CATH LAB;  Service: Cardiovascular;  Laterality: N/A;     Current Outpatient Medications  Medication Sig Dispense Refill   ALPRAZolam  (XANAX ) 0.5 MG tablet Take 1 tablet (0.5 mg total) by mouth 2 (  two) times daily as needed for anxiety (related to travel. do not use within 12 hours of adderall. do not drive for 8 hours after taking). 20 tablet 0   aspirin  81 MG tablet Take 81 mg by mouth daily.     levothyroxine  (SYNTHROID ) 75 MCG tablet TAKE 1 TABLET BY MOUTH EVERY DAY *MANUFACTURER CHANGE OKAY PER MD OFFICE* 90 tablet 0   metoprolol  succinate (TOPROL -XL) 25 MG 24 hr tablet TAKE 1 TABLET (25 MG TOTAL) BY MOUTH DAILY. 90 tablet 0   tadalafil  (CIALIS ) 5 MG tablet TAKE 1 TABLET (5 MG TOTAL) BY MOUTH DAILY. 30 tablet 11   Vitamin D , Ergocalciferol , (DRISDOL ) 1.25 MG (50000 UNIT) CAPS capsule Take 1  capsule (50,000 Units total) by mouth every 7 (seven) days. 12 capsule 0   No current facility-administered medications for this visit.    Allergies:   Patient has no known allergies.    Social History:  The patient  reports that he has never smoked. He has never used smokeless tobacco. He reports current alcohol use of about 4.0 standard drinks of alcohol per week. He reports that he does not use drugs.   Family History:  The patient's family history includes Bipolar disorder in his brother; Diabetes in his brother; Hypothyroidism in his mother and sister; Lung cancer in an other family member.    ROS:  Please see the history of present illness.   Otherwise, review of systems are positive for none.   All other systems are reviewed and negative.    PHYSICAL EXAM: VS:  BP 116/72 (BP Location: Left Arm, Patient Position: Sitting)   Pulse 77   Ht 5' 7 (1.702 m)   Wt 228 lb 12.8 oz (103.8 kg)   SpO2 96%   BMI 35.84 kg/m  , BMI Body mass index is 35.84 kg/m. GENERAL:  Well appearing, overweight WM in NAD HEENT:  PERRL, EOMI, sclera are clear. Oropharynx is clear. NECK:  No jugular venous distention, carotid upstroke brisk and symmetric, no bruits, no thyromegaly or adenopathy LUNGS:  Clear to auscultation bilaterally CHEST:  Unremarkable HEART:  RRR,  PMI not displaced or sustained,S1 and S2 within normal limits, no S3, no S4: no clicks, no rubs, gr 2/6 SEM radiating to carotids bilaterally.  ABD:  Soft, nontender. BS +, no masses or bruits. No hepatomegaly, no splenomegaly EXT:  2 + pulses throughout, no edema, no cyanosis no clubbing SKIN:  Warm and dry.  No rashes NEURO:  Alert and oriented x 3. Cranial nerves II through XII intact. PSYCH:  Cognitively intact   EKG Interpretation Date/Time:  Wednesday December 20 2023 09:29:34 EDT Ventricular Rate:  77 PR Interval:  144 QRS Duration:  134 QT Interval:  388 QTC Calculation: 439 R Axis:   -22  Text Interpretation: Normal sinus  rhythm Right bundle branch block When compared with ECG of October 14,2021 No significant change was found Confirmed by [place], Avary Pitsenbarger [phone]) on 12/20/2023 9:32:19 AM    Recent Labs: No results found for requested labs within last 365 days.    Lipid Panel    Component Value Date/Time   CHOL 184 12/15/2022 1111   CHOL 135 02/01/2021 1006   TRIG 87.0 12/15/2022 1111   HDL 46.10 12/15/2022 1111   HDL 49 02/01/2021 1006   CHOLHDL 4 12/15/2022 1111   VLDL 17.4 12/15/2022 1111   LDLCALC 120 (H) 12/15/2022 1111   LDLCALC 67 02/01/2021 1006   LDLDIRECT 61 11/24/2017 1544     Lab  Results  Component Value Date   WBC 6.8 12/15/2022   HGB 14.9 12/15/2022   HCT 46.1 12/15/2022   PLT 281.0 12/15/2022   GLUCOSE 112 (H) 12/15/2022   CHOL 184 12/15/2022   TRIG 87.0 12/15/2022   HDL 46.10 12/15/2022   LDLDIRECT 61 11/24/2017   LDLCALC 120 (H) 12/15/2022   ALT 26 12/15/2022   AST 18 12/15/2022   NA 139 12/15/2022   K 4.5 12/15/2022   CL 106 12/15/2022   CREATININE 0.89 12/15/2022   BUN 13 12/15/2022   CO2 25 12/15/2022   TSH 2.92 12/15/2022   PSA 1.02 09/06/2022   INR 1.01 10/18/2014   HGBA1C 6.0 12/15/2022     Wt Readings from Last 3 Encounters:  12/20/23 228 lb 12.8 oz (103.8 kg)  12/15/22 227 lb 12.8 oz (103.3 kg)  08/15/22 224 lb 6.4 oz (101.8 kg)      Other studies Reviewed: Additional studies/ records that were reviewed today include: none  ETT 02/12/19: Study Highlights  The patient walked for 9 minutes and 10 seconds on a standard Bruce protocol treadmill test He achieved a peak heart rate of 164 which is 92% predicted maximal heart rate. He had a right bundle branch block at baseline. The blood pressure response to exercise was normal. There were no ST or T wave changes to suggest ischemia at peak exercise. This is interpreted as a negative stress test. There is no evidence of ischemia.     ASSESSMENT AND PLAN:   1. CAD-  s/p PTCA/DES to prox RCA (09/2014)  Myoview in Dec 2016 as noted above. Normal ETT in August 2020. Has stable class 1 symptoms -- Continue ASA and BB.  -- Encourage more aerobic activity and weight loss --he is interested in follow up evaluation. Will arrange for stress Myoview  2. Ischemic CM- EF has recovered. Normal Echo in September 2017  3. HLD- intolerant of statin therapy due to myalgias. Will update labs today and refer to Pharm D for treatment options.    4. Mild aortic stenosis. Last Echo 2022. Will update now.   Disposition:   FU with me in one year   Signed, Aaliyan Brinkmeier [place], MD  12/20/2023 9:49 AM

## 2023-12-20 ENCOUNTER — Encounter: Payer: Self-pay | Admitting: Cardiology

## 2023-12-20 ENCOUNTER — Ambulatory Visit: Attending: Cardiology | Admitting: Cardiology

## 2023-12-20 VITALS — BP 116/72 | HR 77 | Ht 67.0 in | Wt 228.8 lb

## 2023-12-20 DIAGNOSIS — I4510 Unspecified right bundle-branch block: Secondary | ICD-10-CM | POA: Diagnosis not present

## 2023-12-20 DIAGNOSIS — E785 Hyperlipidemia, unspecified: Secondary | ICD-10-CM

## 2023-12-20 DIAGNOSIS — I2510 Atherosclerotic heart disease of native coronary artery without angina pectoris: Secondary | ICD-10-CM | POA: Diagnosis not present

## 2023-12-20 DIAGNOSIS — I350 Nonrheumatic aortic (valve) stenosis: Secondary | ICD-10-CM | POA: Diagnosis not present

## 2023-12-20 DIAGNOSIS — Z9861 Coronary angioplasty status: Secondary | ICD-10-CM

## 2023-12-20 NOTE — Patient Instructions (Signed)
 Medication Instructions:  Continue same medications *If you need a refill on your cardiac medications before your next appointment, please call your pharmacy*  Lab Work: Cmet,cbc,lipid panel,tsh today  Testing/Procedures: Schedule Echo   first available      No Prep   Schedule Stress Myoview      Hold Metoprolol  morning of   No Caffeine morning of    No food 4 hours before     Follow-Up: At [facility], you and your health needs are our priority.  As part of our continuing mission to provide you with exceptional heart care, our providers are all part of one team.  This team includes your primary Cardiologist (physician) and Advanced Practice Providers or APPs (Physician Assistants and Nurse Practitioners) who all work together to provide you with the care you need, when you need it.  Your next appointment:  1 year    Call in March to schedule June appointment     Provider:  Dr.Jordan           Schedule appointment with Pharm D  [facility]     We recommend signing up for the patient portal called MyChart.  Sign up information is provided on this After Visit Summary.  MyChart is used to connect with patients for Virtual Visits (Telemedicine).  Patients are able to view lab/test results, encounter notes, upcoming appointments, etc.  Non-urgent messages can be sent to your provider as well.   To learn more about what you can do with MyChart, go to [email or web address].

## 2023-12-21 ENCOUNTER — Other Ambulatory Visit: Payer: Self-pay | Admitting: Cardiology

## 2023-12-21 DIAGNOSIS — E785 Hyperlipidemia, unspecified: Secondary | ICD-10-CM

## 2023-12-21 DIAGNOSIS — I350 Nonrheumatic aortic (valve) stenosis: Secondary | ICD-10-CM

## 2023-12-21 DIAGNOSIS — I2510 Atherosclerotic heart disease of native coronary artery without angina pectoris: Secondary | ICD-10-CM

## 2023-12-21 DIAGNOSIS — I4510 Unspecified right bundle-branch block: Secondary | ICD-10-CM

## 2023-12-27 ENCOUNTER — Ambulatory Visit: Payer: Self-pay | Admitting: Cardiology

## 2023-12-27 ENCOUNTER — Ambulatory Visit: Attending: Cardiology | Admitting: Pharmacist

## 2023-12-27 ENCOUNTER — Ambulatory Visit (HOSPITAL_COMMUNITY)
Admission: RE | Admit: 2023-12-27 | Discharge: 2023-12-27 | Disposition: A | Discharge status: Home / Self Care | Source: Ambulatory Visit | Attending: Cardiology | Admitting: Cardiology

## 2023-12-27 DIAGNOSIS — I2510 Atherosclerotic heart disease of native coronary artery without angina pectoris: Secondary | ICD-10-CM | POA: Diagnosis not present

## 2023-12-27 DIAGNOSIS — E785 Hyperlipidemia, unspecified: Secondary | ICD-10-CM | POA: Insufficient documentation

## 2023-12-27 DIAGNOSIS — I350 Nonrheumatic aortic (valve) stenosis: Secondary | ICD-10-CM | POA: Diagnosis not present

## 2023-12-27 DIAGNOSIS — Z9861 Coronary angioplasty status: Secondary | ICD-10-CM | POA: Diagnosis not present

## 2023-12-27 DIAGNOSIS — I4510 Unspecified right bundle-branch block: Secondary | ICD-10-CM | POA: Diagnosis not present

## 2023-12-27 LAB — MYOCARDIAL PERFUSION IMAGING
Angina Index: 0
Base ST Depression (mm): 0 mm
Duke Treadmill Score: 8
Estimated workload: 10.1
Exercise duration (min): 8 min
Exercise duration (sec): 1 s
LV dias vol: 130 mL (ref 62–150)
LV sys vol: 61 mL (ref 4.2–5.8)
MPHR: 159 {beats}/min
Nuc Stress EF: 53 %
Peak HR: 144 {beats}/min
Percent HR: 90 %
Rest HR: 66 {beats}/min
Rest Nuclear Isotope Dose: 9 mCi
SDS: 10
SRS: 0
SSS: 10
ST Depression (mm): 0 mm
Stress Nuclear Isotope Dose: 31.6 mCi
TID: 1.19

## 2023-12-27 LAB — LIPID PANEL

## 2023-12-27 MED ORDER — TECHNETIUM TC 99M TETROFOSMIN IV KIT
9.0000 | PACK | Freq: Once | INTRAVENOUS | Status: AC | PRN
  Administered 2023-12-27: 9 via INTRAVENOUS

## 2023-12-27 MED ORDER — TECHNETIUM TC 99M TETROFOSMIN IV KIT
31.6000 | PACK | Freq: Once | INTRAVENOUS | Status: AC | PRN
  Administered 2023-12-27: 31.6 via INTRAVENOUS

## 2023-12-27 NOTE — Patient Instructions (Addendum)
 I will submit a prior authorization for Repatha. I will call you once I hear back. Please call me at [phone] with any questions.   Repatha is a cholesterol medication that improved your body's ability to get rid of bad cholesterol known as LDL. It can lower your LDL up to 60%! It is an injection that is given under the skin every 2 weeks. The medication often requires a prior authorization from your insurance company. We will take care of submitting all the necessary information to your insurance company to get it approved. The most common side effects of Repatha include runny nose, symptoms of the common cold, rarely flu or flu-like symptoms, back/muscle pain in about 3-4% of the patients, and redness, pain, or bruising at the injection site. Tell your healthcare provider if you have any side effect that bothers you or that does not go away.   Hyperlipidemia Foods high in saturated fat tend to increase LDL (bad) cholesterol the most.  Not all fat is bad fat! Foods higher in unsaturated fat are healthy, like fish, nuts, and avocadoes. Overall, following a diet like the Mediterranean diet can help to improve your cholesterol. Hypertriglyceridemia Foods high in carbohydrates and sugar, as well as alcohol, can increase your triglycerides. If you are diabetic, poorly controlled blood sugar can also increase your triglycerides. A non-fasting state can affect the triglyceride level in your lab work. Please make sure you are fasting to improve accuracy of this lab test.  Eat more of these Eat less of these  Carbohydrates Fiber-rich whole grains: oats, whole wheat pasta or bread, quinoa, barley, oats and brown rice Aim for  of your plate to be whole grains Men: aim for > 38 grams of fiber per day Women: aim for > 25 grams of fiber per day Refined grains: white bread, rice, or pasta, macaroni and cheese Foods with added sugar Processed foods: desserts like cake, cookies, donuts, muffins, and  pastries; microwave meals, chips, [place] fries  Fruits and vegetables A variety of bright colored fruits and vegetables: spinach, broccoli, tomatoes, carrots, berries,  oranges, apples, bananas, berries, and melon Aim for  of your plate to be fruits/vegetables Canned vegetables Starchy vegetables like potatoes Canned fruit in heavy syrup  Protein Lean meat: skinless chicken or [place] Fish: salmon, trout, tuna, cod, tilapia, flounder, etc Legumes: beans, lentils, chickpeas, tofu, nuts Aim for  of your plate to be protein Red, fatty, or fried meat Processed foods: deli meat, hot dogs, burgers, pizza, fast food   Dairy, fats and oils Unsaturated fats: fish, nuts, and avocadoes  Low fat or fat free milk or yogurt Olive and canola oil Saturated fats: butter, lard, cream, coconut oil Whole milk and other full fat dairy products like cheese Sugar-sweetened dairy products (many yogurts have added sugar)  Drinks Water: plain or sparkling Sugar free or diet drinks Unsweet tea or coffee Keep added sugar intake to  < 6 teaspoons (24 grams) Regular soda Fruit juice Alcohol  Other ways to adopt a healthy lifestyle:  Exercise:  Exercise: Aim for 150 min of moderate intensity exercise weekly for heart health, and weights twice weekly for bone health. Stay active - any steps are better than no steps!  Sleep: Aim for 7-9 hours of sleep nightly.  Weight: Know what a healthy weight is for you (roughly BMI <25) and aim to maintain this. Unfortunately, this is not the most accurate measure of healthy weight, but it is the simplest measurement to use. A more accurate  measurement involves body scanning which measures lean muscle, fat tissue and bony density. We do not have this equipment at [facility].

## 2023-12-27 NOTE — Assessment & Plan Note (Addendum)
 Assessment: Last LDL-C 120 which is above goal Latest labs about a year ago, patient will get repeat labs today History of premature coronary artery disease requiring stent at age less than 60 Intolerant to atorvastatin  80 mg and 40 mg (myalgias) No tobacco and very limited alcohol use Blood pressure well-controlled Patient knows he needs to be more physically active He travels frequently for work We discussed options including trying rosuvastatin 20 mg daily versus PCSK9 inhibitors.  Reviewed that both options have cardiovascular risk reduction data however PCSK9 data was when patient was also on statin (may certainly reduce cardiovascular risk when used alone but do not have any trial to show so).  Reviewed injection technique of Repatha, side effects and cost.  Plan: Patient would prefer to try Repatha Submit prior authorization to insurance for Repatha once labs from today result If they require another trial of statin, he would be willing to try rosuvastatin

## 2023-12-27 NOTE — Progress Notes (Signed)
 Patient ID: Luke Rice                 DOB: August 29, 1960                    MRN: [id number]      HPI: Luke Rice is a 63 y.o. male patient referred to lipid clinic by Dr. Swaziland. PMH is significant for CAD s/p PTCA/DES to prox RCA (09/2014), ischemic cardiomyopathy EF 45, HLD, hypothyroidism, chronic RBBB, morbid obesity and psoriasis.   Seen by Dr. Swaziland.  12/20/23.  Admitted he stopped taking his atorvastatin  due to myalgias. Presents today to lipid clinic, also having nuclear stress test done.  He reports that he tolerated atorvastatin  80 for a while and then started having significant muscle tightness and pain.  Felt much better off atorvastatin .  He resumed at 40 mg and did fine for a while and then started having same symptoms.  He stopped taking altogether.  He travels frequently and does not do much exercise.  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  We discussed options including trying rosuvastatin 20 mg daily versus PCSK9 inhibitors.  Reviewed that both options have cardiovascular risk reduction data however PCSK9 data was when patient was also on statin (may certainly reduce cardiovascular risk when used alone but do not have any trial to show so).  Reviewed injection technique of Repatha, side effects and cost.  Current Medications: none Intolerances: atorvastatin  80mg , 40mg  (myalgias) Risk Factors: premature CAD LDL-C goal: <70 (could consider less than 55 due to premature disease) ApoB goal: <80  Diet: Travels frequently  Exercise: none  Family History:   The patient's family history includes Bipolar disorder in his brother; Diabetes in his brother; Hypothyroidism in his mother and sister; Lung cancer in an other family member.   Social History: no tobacco, moderate amount ETOH 1 drink per week  Labs: Lipid Panel     Component Value Date/Time   CHOL 184 12/15/2022 1111   CHOL 135 02/01/2021 1006   TRIG 87.0 12/15/2022 1111   HDL 46.10  12/15/2022 1111   HDL 49 02/01/2021 1006   CHOLHDL 4 12/15/2022 1111   VLDL 17.4 12/15/2022 1111   LDLCALC 120 (H) 12/15/2022 1111   LDLCALC 67 02/01/2021 1006   LDLDIRECT 61 11/24/2017 1544   LABVLDL 19 02/01/2021 1006    Past Medical History:  Diagnosis Date   BPH (benign prostatic hyperplasia)    describes treatment when lived out of country, not currently as bothresome   Coronary artery disease    a.  LHC 10/20/14 s/p PTCA/DES to prox RCA, FFR of mLAD (found to be not hemodynamically significant), also occluded distal LCx prior to terminal small OM branch with L-L collaterals. Residual dz for med rx.   Dyslipidemia    Hyperglycemia    a. A1C 6.1 in 09/2014.   Hypothyroidism    Ischemic cardiomyopathy    a. EF 45% by cath 09/2014.   Obesity    Plantar fasciitis of left foot    Psoriasis    RBBB    Renal cyst 01/22/2014   17mm left kidney. Bozniak Class I in discussion with [facility]. No further follow up.      Current Outpatient Medications on File Prior to Visit  Medication Sig Dispense Refill   aspirin  81 MG tablet Take 81 mg by mouth daily.     levothyroxine  (SYNTHROID ) 75 MCG tablet TAKE 1 TABLET BY  MOUTH EVERY DAY *MANUFACTURER CHANGE OKAY PER MD OFFICE* 90 tablet 0   metoprolol  succinate (TOPROL -XL) 25 MG 24 hr tablet TAKE 1 TABLET (25 MG TOTAL) BY MOUTH DAILY. 90 tablet 0   tadalafil  (CIALIS ) 5 MG tablet TAKE 1 TABLET (5 MG TOTAL) BY MOUTH DAILY. 30 tablet 11   ALPRAZolam  (XANAX ) 0.5 MG tablet Take 1 tablet (0.5 mg total) by mouth 2 (two) times daily as needed for anxiety (related to travel. do not use within 12 hours of adderall. do not drive for 8 hours after taking). 20 tablet 0   Vitamin D , Ergocalciferol , (DRISDOL ) 1.25 MG (50000 UNIT) CAPS capsule Take 1 capsule (50,000 Units total) by mouth every 7 (seven) days. (Patient not taking: Reported on 12/27/2023) 12 capsule 0   No current facility-administered medications on file prior to visit.     No Known Allergies  Assessment/Plan:  1. Hyperlipidemia -  Dyslipidemia Assessment: Last LDL-C 120 which is above goal Latest labs about a year ago, patient will get repeat labs today History of premature coronary artery disease requiring stent at age less than 24 Intolerant to atorvastatin  80 mg and 40 mg (myalgias) No tobacco and very limited alcohol use Blood pressure well-controlled Patient knows he needs to be more physically active He travels frequently for work We discussed options including trying rosuvastatin 20 mg daily versus PCSK9 inhibitors.  Reviewed that both options have cardiovascular risk reduction data however PCSK9 data was when patient was also on statin (may certainly reduce cardiovascular risk when used alone but do not have any trial to show so).  Reviewed injection technique of Repatha, side effects and cost.  Plan: Patient would prefer to try Repatha Submit prior authorization to insurance for Repatha once labs from today result If they require another trial of statin, he would be willing to try rosuvastatin    Thank you,  Elverda Wendel D Stevin Bielinski, Pharm.JONETTA SARAN, CPP [facility] of [facility] [address] [place], [place] [address]  Phone: [phone]; Fax: [phone]

## 2023-12-28 LAB — CBC WITH DIFFERENTIAL/PLATELET
Basophils Absolute: 0.1 10*3/uL (ref 0.0–0.2)
Basos: 1 %
EOS (ABSOLUTE): 0.2 10*3/uL (ref 0.0–0.4)
Eos: 2 %
Hematocrit: 48.1 % (ref 37.5–51.0)
Hemoglobin: 15.6 g/dL (ref 13.0–17.7)
Immature Grans (Abs): 0 10*3/uL (ref 0.0–0.1)
Immature Granulocytes: 0 %
Lymphocytes Absolute: 2.1 10*3/uL (ref 0.7–3.1)
Lymphs: 31 %
MCH: 28.9 pg (ref 26.6–33.0)
MCHC: 32.4 g/dL (ref 31.5–35.7)
MCV: 89 fL (ref 79–97)
Monocytes Absolute: 0.5 10*3/uL (ref 0.1–0.9)
Monocytes: 8 %
Neutrophils Absolute: 4 10*3/uL (ref 1.4–7.0)
Neutrophils: 57 %
Platelets: 334 10*3/uL (ref 150–450)
RBC: 5.4 x10E6/uL (ref 4.14–5.80)
RDW: 13.3 % (ref 11.6–15.4)
WBC: 6.9 10*3/uL (ref 3.4–10.8)

## 2023-12-28 LAB — COMPREHENSIVE METABOLIC PANEL WITH GFR
ALT: 39 IU/L (ref 0–44)
AST: 26 IU/L (ref 0–40)
Albumin: 4.3 g/dL (ref 3.9–4.9)
Alkaline Phosphatase: 63 IU/L (ref 44–121)
BUN/Creatinine Ratio: 15 (ref 10–24)
BUN: 16 mg/dL (ref 8–27)
Bilirubin Total: 0.5 mg/dL (ref 0.0–1.2)
CO2: 23 mmol/L (ref 20–29)
Calcium: 9.6 mg/dL (ref 8.6–10.2)
Chloride: 102 mmol/L (ref 96–106)
Creatinine, Ser: 1.06 mg/dL (ref 0.76–1.27)
Globulin, Total: 2.7 g/dL (ref 1.5–4.5)
Glucose: 108 mg/dL — AB (ref 70–99)
Potassium: 4.6 mmol/L (ref 3.5–5.2)
Sodium: 139 mmol/L (ref 134–144)
Total Protein: 7 g/dL (ref 6.0–8.5)
eGFR: 80 mL/min/{1.73_m2} (ref >59)

## 2023-12-28 LAB — TSH: TSH: 4.61 u[IU]/mL — AB (ref 0.450–4.500)

## 2023-12-28 LAB — LIPID PANEL
Cholesterol, Total: 167 mg/dL (ref 100–199)
HDL: 44 mg/dL (ref >39)
LDL CALC COMMENT:: 3.8 ratio (ref 0.0–5.0)
LDL Chol Calc (NIH): 99 mg/dL (ref 0–99)
Triglycerides: 137 mg/dL (ref 0–149)
VLDL Cholesterol Cal: 24 mg/dL (ref 5–40)

## 2024-01-01 ENCOUNTER — Other Ambulatory Visit: Payer: Self-pay

## 2024-01-01 ENCOUNTER — Other Ambulatory Visit (HOSPITAL_COMMUNITY): Payer: Self-pay

## 2024-01-01 ENCOUNTER — Telehealth: Payer: Self-pay | Admitting: Pharmacy Technician

## 2024-01-01 DIAGNOSIS — E785 Hyperlipidemia, unspecified: Secondary | ICD-10-CM

## 2024-01-01 MED ORDER — REPATHA SURECLICK 140 MG/ML ~~LOC~~ SOAJ: 1.0000 mL | SUBCUTANEOUS | 11 refills | Status: AC

## 2024-01-01 NOTE — Telephone Encounter (Signed)
 Pharmacy Patient Advocate Encounter  Received notification from [facility] that Prior Authorization for Repatha  has been APPROVED from 01/01/24 to 12/31/24. Ran test claim, Copay is $24.99- one month,  $999.73- 3 months. This test claim was processed through [facility]- copay amounts may vary at other pharmacies due to pharmacy/plan contracts, or as the patient moves through the different stages of their insurance plan.   PA #/Case ID/Reference #: [id number]

## 2024-01-01 NOTE — Telephone Encounter (Signed)
 Called and offered pt OV on 7/15 @ 4pm but he declined stating he will be leaving town next week for the next 3 weeks. I was able to schedule him for 8/18 @ 3pm. Did you want to work patient in or is it okay to wait until August appt? Please advise.

## 2024-01-01 NOTE — Addendum Note (Signed)
 Addended by: Briante Loveall D on: 01/01/2024 03:09 PM   Modules accepted: Orders

## 2024-01-01 NOTE — Telephone Encounter (Signed)
 Pharmacy Patient Advocate Encounter   Received notification from Physician's Office that prior authorization for Repatha  is required/requested.   Insurance verification completed.   The patient is insured through [facility] .   Per test claim: PA required; PA submitted to above mentioned insurance via CoverMyMeds Key/confirmation #/EOC [id number] Status is pending

## 2024-01-01 NOTE — Telephone Encounter (Signed)
 Patient made aware of approval. Reminded him to get copay card. Rx sent to pharmacy. Repeat labs in 3 months

## 2024-02-12 ENCOUNTER — Encounter: Payer: Self-pay | Admitting: Family Medicine

## 2024-02-12 ENCOUNTER — Ambulatory Visit (INDEPENDENT_AMBULATORY_CARE_PROVIDER_SITE_OTHER): Admitting: Family Medicine

## 2024-02-12 VITALS — BP 118/62 | HR 73 | Temp 98.1°F | Ht 67.0 in | Wt 223.2 lb

## 2024-02-12 DIAGNOSIS — Z23 Encounter for immunization: Secondary | ICD-10-CM | POA: Diagnosis not present

## 2024-02-12 DIAGNOSIS — E785 Hyperlipidemia, unspecified: Secondary | ICD-10-CM | POA: Diagnosis not present

## 2024-02-12 DIAGNOSIS — Z131 Encounter for screening for diabetes mellitus: Secondary | ICD-10-CM

## 2024-02-12 DIAGNOSIS — G720 Drug-induced myopathy: Secondary | ICD-10-CM

## 2024-02-12 DIAGNOSIS — R7303 Prediabetes: Secondary | ICD-10-CM | POA: Diagnosis not present

## 2024-02-12 DIAGNOSIS — E039 Hypothyroidism, unspecified: Secondary | ICD-10-CM

## 2024-02-12 DIAGNOSIS — E559 Vitamin D deficiency, unspecified: Secondary | ICD-10-CM

## 2024-02-12 DIAGNOSIS — Z125 Encounter for screening for malignant neoplasm of prostate: Secondary | ICD-10-CM

## 2024-02-12 MED ORDER — KETOCONAZOLE 2 % EX CREA: 1.0000 | TOPICAL_CREAM | Freq: Two times a day (BID) | CUTANEOUS | 2 refills | Status: AC

## 2024-02-12 MED ORDER — TADALAFIL 5 MG PO TABS: 5.0000 mg | ORAL_TABLET | Freq: Every day | ORAL | 3 refills | Status: AC

## 2024-02-12 NOTE — Progress Notes (Signed)
 Phone [phone] In person visit   Subjective:   Luke Rice is a 63 y.o. year old very pleasant male patient who presents for/with See problem oriented charting Chief Complaint  Patient presents with   Medical Management of Chronic Issues    Issues with fungal infection after vacation; discuss imaging results;     Past Medical History-  Patient Active Problem List   Diagnosis Date Noted   CAD S/P percutaneous coronary angioplasty 10/21/2014    Priority: High   Psoriasis     Priority: High   Attention deficit disorder (ADD) in adult 08/15/2022    Priority: Medium    Mild aortic stenosis 03/07/2021    Priority: Medium    Insomnia 02/10/2015    Priority: Medium    Dyslipidemia 10/21/2014    Priority: Medium    Hypothyroidism     Priority: Medium    Erectile dysfunction 10/10/2019    Priority: Low   RBBB     Priority: Low   Obesity 01/22/2014    Priority: Low   Migraine 01/22/2014    Priority: Low   Plantar fasciitis of left foot     Priority: Low   BPH (benign prostatic hyperplasia)     Priority: Low    Medications- reviewed and updated Current Outpatient Medications  Medication Sig Dispense Refill   aspirin  81 MG tablet Take 81 mg by mouth daily.     Evolocumab  (REPATHA  SURECLICK) 140 MG/ML SOAJ Inject 140 mg into the skin every 14 (fourteen) days. 2 mL 11   ketoconazole  (NIZORAL ) 2 % cream Apply 1 Application topically in the morning and at bedtime. At least 2 full weeks to calm this down 60 g 2   levothyroxine  (SYNTHROID ) 75 MCG tablet TAKE 1 TABLET BY MOUTH EVERY DAY *MANUFACTURER CHANGE OKAY PER MD OFFICE* 90 tablet 0   metoprolol  succinate (TOPROL -XL) 25 MG 24 hr tablet TAKE 1 TABLET (25 MG TOTAL) BY MOUTH DAILY. 90 tablet 0   ALPRAZolam  (XANAX ) 0.5 MG tablet Take 1 tablet (0.5 mg total) by mouth 2 (two) times daily as needed for anxiety (related to travel. do not use within 12 hours of adderall. do not drive for 8 hours after taking). (Patient not taking:  Reported on 02/12/2024) 20 tablet 0   tadalafil  (CIALIS ) 5 MG tablet Take 1 tablet (5 mg total) by mouth daily. 90 tablet 3   No current facility-administered medications for this visit.     Objective:  BP 118/62 (BP Location: Left Arm, Patient Position: Sitting, Cuff Size: Normal)   Pulse 73   Temp 98.1 F (36.7 C) (Temporal)   Ht 5' 7 (1.702 m)   Wt 223 lb 3.2 oz (101.2 kg)   SpO2 95%   BMI 34.96 kg/m  Gen: NAD, resting comfortably CV: RRR  Lungs: CTAB no crackles, wheeze, rhonchi Ext: no edema Skin: warm, dry, erythematous rash with clear border beneath the the scrotum with some excoriation     Assessment and Plan   # Concern for fungal infection/jock itch S: Patient with recent vacation to [place] (did get viral gastroenterology illness but that got better) . Has tried jock itch sprays- 1-2 x day.  Was very sweaty due to humidity there A/P:  Appears to be jock itch- agree with you -treat with ketoconazole  twice daily for at least 2 weeks    #Coronary artery disease with history of stent in 2016-sees Dr. Swaziland #Ischemic cardiomyopathy-thankfully EF recovered #Hyperlipidemia-statin intolerant with myalgias on atorvastatin  40 mg and 80  mg S: Medication: Aspirin  81 mg and Repatha  140 mg every 2 weeks- opted out of rosuvastatin . Also on metoprolol  25 mg XR.  Nitroglycerin  in past Symptoms: no chest pain or shortness of breath but not as active as hed like to be eventually Lab Results  Component Value Date   CHOL 167 12/27/2023   HDL 44 12/27/2023   LDLCALC 99 12/27/2023   LDLDIRECT 61 11/24/2017   TRIG 137 12/27/2023   CHOLHDL 3.8 12/27/2023    A/P: CAD remains asymptomatic-we discussed his recent imaging results with possible area of reversibility or ischemia-I encouraged him to schedule follow-up with Dr. Swaziland to discuss further-he reports similar area years ago and he is not sure if this will need intervention but I told him the best person to know that would  be Dr. Swaziland - Also has ischemic cardiomyopathy with prior EF fully recovered but upcoming echocardiogram to reassess-encouraged him to schedule visit after that so they could discuss both sets of results - Hopefully lipids improving on Repatha -he will come back for fasting labs in about 4 weeks so he will have at least 4 injections in and we can also check other blood work  #hypothyroidism S: compliant On thyroid  medication-levothyroxine  75 mcg - was missing some doses Lab Results  Component Value Date   TSH 4.610 (H) 12/27/2023  A/P: He is try to be more consistent-will come back for TSH check within 4 weeks   # Hyperglycemia/insulin resistance/prediabetes- a1c max 6.2 S:  Medication: None  Exercise and diet-mild weight loss from last year but not exercising we discussed starting this after he gets approval from cardiology Lab Results  Component Value Date   HGBA1C 6.0 12/15/2022   HGBA1C 6.2 09/06/2021   HGBA1C 6.1 07/11/2019  A/P: Update A1c with upcoming labs-will be at least able  #Vitamin D  deficiency S: Medication: prior high dose- advised starting at least 1000 units a day Last vitamin D  Lab Results  Component Value Date   VD25OH 21.65 (L) 12/15/2022  A/P: Hopefuly stable or improved- update vitamin D  with labs at upcoming visit.  Asked him to take at least 1000 units a day  # BPH S:Medication: Tadalafil  5 mg daily -hard to urinate first time he wakes up to pee at times.  - notes decrease in ejaculate volume and some sensitivity with ejaculate about a year ago walong with bladder function - tamsulosin  0.4 mg- stopped as had lightheadedness with standing/orthostatic symptoms A/P: Patient seems to be having some BPH symptoms despite tadalafil  and wants to evaluate risk with PSA for prostate cancer-this was ordered today  Recommended follow up: Return in about 6 months (around 08/14/2024) for physical or sooner if needed.Schedule b4 you leave. Future Appointments  Date  Time Provider Department Center  02/16/2024  1:15 PM [facility] 1 [facility]  05/13/2024  8:20 AM Katrinka Garnette KIDD, MD [facility]    Lab/Order associations:   ICD-10-CM   1. Hypothyroidism, unspecified type  E03.9 TSH    2. Dyslipidemia  E78.5 Comprehensive metabolic panel with GFR    CBC with Differential/Platelet    Lipid panel    3. Screening for diabetes mellitus  Z13.1 Hemoglobin A1c    4. Prediabetes  R73.03 Hemoglobin A1c    5. Screening for prostate cancer  Z12.5 PSA    6. Vitamin D  deficiency  E55.9 VITAMIN D  25 Hydroxy (Vit-D Deficiency, Fractures)    7. Drug-induced myopathy  G72.0     8. Need for shingles vaccine  Z23 Zoster, Recombinant (Shingrix )      Meds ordered this encounter  Medications   ketoconazole  (NIZORAL ) 2 % cream    Sig: Apply 1 Application topically in the morning and at bedtime. At least 2 full weeks to calm this down    Dispense:  60 g    Refill:  2   tadalafil  (CIALIS ) 5 MG tablet    Sig: Take 1 tablet (5 mg total) by mouth daily.    Dispense:  90 tablet    Refill:  3    Return precautions advised.  Garnette Lukes, MD

## 2024-02-12 NOTE — Patient Instructions (Addendum)
 Second Shingles (Shingrix ) Vaccine given today in the Right Deltoid. Patient tolerated it well, no further questions at this time.   Schedule a lab visit at the check out desk within 4 weeks. Return for future fasting labs meaning nothing but water after midnight please. Ok to take your medications with water.   Can try magnesium glycinate- can find this at [facility] to be jock itch- agree with you -treat with ketoconazole  twice daily for at least 2 weeks  Once cleared from heart perspective woud love to see you increase exercise gradually   Recommended follow up: Return in about 6 months (around 08/14/2024) for physical or sooner if needed.Schedule b4 you leave.

## 2024-02-16 ENCOUNTER — Ambulatory Visit (HOSPITAL_COMMUNITY)
Admission: RE | Admit: 2024-02-16 | Discharge: 2024-02-16 | Disposition: A | Discharge status: Home / Self Care | Source: Ambulatory Visit | Attending: Cardiology | Admitting: Cardiology

## 2024-02-16 DIAGNOSIS — I4510 Unspecified right bundle-branch block: Secondary | ICD-10-CM

## 2024-02-16 DIAGNOSIS — I2510 Atherosclerotic heart disease of native coronary artery without angina pectoris: Secondary | ICD-10-CM | POA: Diagnosis not present

## 2024-02-16 DIAGNOSIS — L570 Actinic keratosis: Secondary | ICD-10-CM | POA: Diagnosis not present

## 2024-02-16 DIAGNOSIS — I350 Nonrheumatic aortic (valve) stenosis: Secondary | ICD-10-CM

## 2024-02-16 DIAGNOSIS — Z9861 Coronary angioplasty status: Secondary | ICD-10-CM | POA: Insufficient documentation

## 2024-02-16 DIAGNOSIS — E785 Hyperlipidemia, unspecified: Secondary | ICD-10-CM | POA: Insufficient documentation

## 2024-02-16 LAB — ECHOCARDIOGRAM COMPLETE
AR max vel: 1.35 cm2
AV Area VTI: 1.29 cm2
AV Area mean vel: 1.31 cm2
AV Mean grad: 25 mmHg
AV Peak grad: 40.7 mmHg
Ao pk vel: 3.19 m/s
Area-P 1/2: 3.84 cm2
S' Lateral: 3.1 cm

## 2024-02-19 ENCOUNTER — Other Ambulatory Visit: Payer: Self-pay

## 2024-02-19 DIAGNOSIS — I350 Nonrheumatic aortic (valve) stenosis: Secondary | ICD-10-CM

## 2024-03-02 ENCOUNTER — Other Ambulatory Visit: Payer: Self-pay | Admitting: Family Medicine

## 2024-03-18 ENCOUNTER — Other Ambulatory Visit: Payer: Self-pay | Admitting: Cardiology

## 2024-05-01 ENCOUNTER — Encounter: Payer: Self-pay | Admitting: Family Medicine

## 2024-05-03 DIAGNOSIS — L304 Erythema intertrigo: Secondary | ICD-10-CM | POA: Diagnosis not present

## 2024-05-03 DIAGNOSIS — D485 Neoplasm of uncertain behavior of skin: Secondary | ICD-10-CM | POA: Diagnosis not present

## 2024-05-03 DIAGNOSIS — L409 Psoriasis, unspecified: Secondary | ICD-10-CM | POA: Diagnosis not present

## 2024-05-03 DIAGNOSIS — L578 Other skin changes due to chronic exposure to nonionizing radiation: Secondary | ICD-10-CM | POA: Diagnosis not present

## 2024-05-03 DIAGNOSIS — D044 Carcinoma in situ of skin of scalp and neck: Secondary | ICD-10-CM | POA: Diagnosis not present

## 2024-05-03 DIAGNOSIS — L570 Actinic keratosis: Secondary | ICD-10-CM | POA: Diagnosis not present

## 2024-05-03 DIAGNOSIS — L814 Other melanin hyperpigmentation: Secondary | ICD-10-CM | POA: Diagnosis not present

## 2024-05-07 ENCOUNTER — Ambulatory Visit (INDEPENDENT_AMBULATORY_CARE_PROVIDER_SITE_OTHER): Admitting: Family Medicine

## 2024-05-07 ENCOUNTER — Encounter: Payer: Self-pay | Admitting: Family Medicine

## 2024-05-07 VITALS — BP 112/70 | HR 64 | Temp 98.2°F | Ht 67.0 in | Wt 220.8 lb

## 2024-05-07 DIAGNOSIS — Z131 Encounter for screening for diabetes mellitus: Secondary | ICD-10-CM

## 2024-05-07 DIAGNOSIS — I350 Nonrheumatic aortic (valve) stenosis: Secondary | ICD-10-CM | POA: Diagnosis not present

## 2024-05-07 DIAGNOSIS — I2510 Atherosclerotic heart disease of native coronary artery without angina pectoris: Secondary | ICD-10-CM | POA: Diagnosis not present

## 2024-05-07 DIAGNOSIS — E559 Vitamin D deficiency, unspecified: Secondary | ICD-10-CM | POA: Diagnosis not present

## 2024-05-07 DIAGNOSIS — Z125 Encounter for screening for malignant neoplasm of prostate: Secondary | ICD-10-CM

## 2024-05-07 DIAGNOSIS — Z9861 Coronary angioplasty status: Secondary | ICD-10-CM

## 2024-05-07 DIAGNOSIS — E785 Hyperlipidemia, unspecified: Secondary | ICD-10-CM

## 2024-05-07 DIAGNOSIS — E039 Hypothyroidism, unspecified: Secondary | ICD-10-CM | POA: Diagnosis not present

## 2024-05-07 DIAGNOSIS — R7303 Prediabetes: Secondary | ICD-10-CM | POA: Diagnosis not present

## 2024-05-07 NOTE — Progress Notes (Signed)
 Phone [phone] In person visit   Subjective:   Luke Rice is a 63 y.o. year old very pleasant male patient who presents for/with See problem oriented charting Chief Complaint  Patient presents with   Fungal Concern    Fungal concern in groin area; would like lab work;     Past Medical History-  Patient Active Problem List   Diagnosis Date Noted   CAD S/P percutaneous coronary angioplasty 10/21/2014    Priority: High   Psoriasis     Priority: High   Attention deficit disorder (ADD) in adult 08/15/2022    Priority: Medium    Moderate aortic stenosis 03/07/2021    Priority: Medium    Insomnia 02/10/2015    Priority: Medium    Dyslipidemia 10/21/2014    Priority: Medium    Hypothyroidism     Priority: Medium    Erectile dysfunction 10/10/2019    Priority: Low   RBBB     Priority: Low   Obesity 01/22/2014    Priority: Low   Migraine 01/22/2014    Priority: Low   Plantar fasciitis of left foot     Priority: Low   BPH (benign prostatic hyperplasia)     Priority: Low    Medications- reviewed and updated Current Outpatient Medications  Medication Sig Dispense Refill   aspirin  81 MG tablet Take 81 mg by mouth daily.     Evolocumab  (REPATHA  SURECLICK) 140 MG/ML SOAJ Inject 140 mg into the skin every 14 (fourteen) days. 2 mL 11   ketoconazole  (NIZORAL ) 2 % cream Apply 1 Application topically in the morning and at bedtime. At least 2 full weeks to calm this down 60 g 2   levothyroxine  (SYNTHROID ) 75 MCG tablet TAKE 1 TABLET BY MOUTH EVERY DAY *MANUFACTURER CHANGE OKAY PER MD OFFICE* 90 tablet 0   metoprolol  succinate (TOPROL -XL) 25 MG 24 hr tablet TAKE 1 TABLET (25 MG TOTAL) BY MOUTH DAILY. 90 tablet 2   tadalafil  (CIALIS ) 5 MG tablet Take 1 tablet (5 mg total) by mouth daily. 90 tablet 3   triamcinolone  cream (KENALOG ) 0.1 % Apply 1 Application topically 2 (two) times daily.     No current facility-administered medications for this visit.     Objective:  BP  112/70 (BP Location: Left Arm, Patient Position: Sitting, Cuff Size: Normal)   Pulse 64   Temp 98.2 F (36.8 C) (Temporal)   Ht 5' 7 (1.702 m)   Wt 220 lb 12.8 oz (100.2 kg)   SpO2 94%   BMI 34.58 kg/m  Gen: NAD, resting comfortably CV: RRR no murmurs rubs or gallops Lungs: CTAB no crackles, wheeze, rhonchi Ext: no edema Skin: warm, dry     Assessment and Plan   #skin cancer- upcoming procedure on scalp  # Fungal concern in groin S: Patient with concern for fungal infection in the groin-started prior to August visit when he was out of country and got very sweaty in the groin area.  We treated with ketoconazole  topically for 2 full weeks to see if we can calm this down at that time on 02/12/2024 as he reports Was intermittent with ketoconazole  and didn't help much- then saw dermatology last week and was restarted on that with triamcinolone  added A/P: tinea cruris on treatment(s) from dermatology- is going to be consistent with this medication this time! And hoping for improvement If improved but doesn't resolve could consider terbinafine 250 mg oral if liver function looks good  #Vitamin D  deficiency S: Medication: off currently but  did complete high dose last year Last vitamin D  Lab Results  Component Value Date   VD25OH 21.65 (L) 12/15/2022  A/P: update vitamin D  with labs - may need high dose again as fell off regular usage.   #Coronary artery disease with history of stent in 2016-sees Dr. Jordan #Ischemic cardiomyopathy-thankfully EF recovered #Hyperlipidemia-statin intolerant with myalgias on atorvastatin  40 mg and 80 mg S: Medication: Aspirin  81 mg and Repatha  140 mg every 2 weeks (up 110 a month in cost)  . Also on metoprolol  25 mg XR.   Symptoms:no chest pain or shortness of breath - no recent nitroglycerin   A/P: coronary artery disease asymptomatic continue current medications Lipids hopefully improved on Repatha    #hypothyroidism S: compliant On thyroid   medication-levothyroxine  75 mcg  Lab Results  Component Value Date   TSH 4.610 (H) 12/27/2023  A/P:hopefully improved- update today- was just mildly off    #Mild aortic stenosis--> moderate S: Stable on echocardiogram September 2022  - Repeated 12/20/2023-progressed to moderate  A/P: with progression he Luke need yearly monitoring now   # Hyperglycemia/insulin resistance/prediabetes- a1c max 6.2 S:  Medication: none Exercise and diet- not exercising- some walking dog . Mild weight loss- 8 lbs from June - reducing portions particularly dinner Lab Results  Component Value Date   HGBA1C 6.0 12/15/2022   HGBA1C 6.2 09/06/2021   HGBA1C 6.1 07/11/2019  A/P: hopefully improved- check a1c with labs   # BPH S:Medication: Tadalafil  5 mg daily Lab Results  Component Value Date   PSA1 1.0 02/01/2021   PSA 1.02 09/06/2022   PSA 0.84 07/11/2019   PSA 0.83 05/23/2018  A/P: has been a few years from PSA with us - update with labs   #anxiety- very sparing alprazolam  in past- not taking much as not traveling- Luke D/C this  Recommended follow up: Return for next already scheduled visit or sooner if needed. Future Appointments  Date Time Provider Department Center  09/13/2024  1:20 PM Luke Luke KIDD, MD [facility]    Lab/Order associations: NOT fasting- peanuts/cashews/coffee this morning 10 30   ICD-10-CM   1. CAD S/P percutaneous coronary angioplasty  I25.10    Z98.61     2. Moderate aortic stenosis  I35.0     3. Hypothyroidism, unspecified type  E03.9     4. Dyslipidemia  E78.5       No orders of the defined types were placed in this encounter.   Return precautions advised.  Luke Katrinka, MD

## 2024-05-07 NOTE — Patient Instructions (Addendum)
 Health Maintenance Due  Topic Date Due   Colonoscopy  08/04/2024  [facility] contact- will be due in February- you can contact them if desired Address: [address] [place], [place], [place] [address] Phone: [phone]   Do all labs ordered 02/12/24 with lab today  We discussed possible Zepbound- you wanted to think it over- reduce prediabetes risk if covered- but we do have coverage issues  Please stop by lab before you go If you have mychart- we will send your results within 3 business days of us  receiving them.  If you do not have mychart- we will call you about results within 5 business days of us  receiving them.  *please also note that you will see labs on mychart as soon as they post. I will later go in and write notes on them- will say notes from Dr. Katrinka   Recommended follow up: Return for next already scheduled visit or sooner if needed.

## 2024-05-08 ENCOUNTER — Ambulatory Visit: Payer: Self-pay | Admitting: Family Medicine

## 2024-05-08 LAB — COMPREHENSIVE METABOLIC PANEL WITH GFR
ALT: 22 U/L (ref 0–53)
AST: 17 U/L (ref 0–37)
Albumin: 4.3 g/dL (ref 3.5–5.2)
Alkaline Phosphatase: 40 U/L (ref 39–117)
BUN: 12 mg/dL (ref 6–23)
CO2: 27 meq/L (ref 19–32)
Calcium: 9.1 mg/dL (ref 8.4–10.5)
Chloride: 104 meq/L (ref 96–112)
Creatinine, Ser: 0.9 mg/dL (ref 0.40–1.50)
GFR: 92.25 mL/min (ref >60.00)
Glucose, Bld: 91 mg/dL (ref 70–99)
Potassium: 4.2 meq/L (ref 3.5–5.1)
Sodium: 138 meq/L (ref 135–145)
Total Bilirubin: 0.6 mg/dL (ref 0.2–1.2)
Total Protein: 6.7 g/dL (ref 6.0–8.3)

## 2024-05-08 LAB — CBC WITH DIFFERENTIAL/PLATELET
Basophils Absolute: 0.1 K/uL (ref 0.0–0.1)
Basophils Relative: 1.1 % (ref 0.0–3.0)
Eosinophils Absolute: 0.1 K/uL (ref 0.0–0.7)
Eosinophils Relative: 1.8 % (ref 0.0–5.0)
HCT: 43.8 % (ref 39.0–52.0)
Hemoglobin: 14.7 g/dL (ref 13.0–17.0)
Lymphocytes Relative: 28.1 % (ref 12.0–46.0)
Lymphs Abs: 2 K/uL (ref 0.7–4.0)
MCHC: 33.5 g/dL (ref 30.0–36.0)
MCV: 84.8 fl (ref 78.0–100.0)
Monocytes Absolute: 0.7 K/uL (ref 0.1–1.0)
Monocytes Relative: 9.4 % (ref 3.0–12.0)
Neutro Abs: 4.3 K/uL (ref 1.4–7.7)
Neutrophils Relative %: 59.6 % (ref 43.0–77.0)
Platelets: 269 K/uL (ref 150.0–400.0)
RBC: 5.16 Mil/uL (ref 4.22–5.81)
RDW: 14.6 % (ref 11.5–15.5)
WBC: 7.2 K/uL (ref 4.0–10.5)

## 2024-05-08 LAB — LIPID PANEL
Cholesterol: 153 mg/dL (ref 0–200)
HDL: 46.6 mg/dL (ref >39.00)
LDL Cholesterol: 81 mg/dL (ref 0–99)
NonHDL: 106.51
Total CHOL/HDL Ratio: 3
Triglycerides: 128 mg/dL (ref 0.0–149.0)
VLDL: 25.6 mg/dL (ref 0.0–40.0)

## 2024-05-08 LAB — HEMOGLOBIN A1C: Hgb A1c MFr Bld: 6 % (ref 4.6–6.5)

## 2024-05-08 LAB — PSA: PSA: 0.89 ng/mL (ref 0.10–4.00)

## 2024-05-08 LAB — VITAMIN D 25 HYDROXY (VIT D DEFICIENCY, FRACTURES): VITD: 24.75 ng/mL — ABNORMAL LOW (ref 30.00–100.00)

## 2024-05-08 LAB — TSH: TSH: 5.35 u[IU]/mL (ref 0.35–5.50)

## 2024-05-08 MED ORDER — VITAMIN D (ERGOCALCIFEROL) 1.25 MG (50000 UNIT) PO CAPS
50000.0000 [IU] | ORAL_CAPSULE | ORAL | 1 refills | Status: AC
Start: 2024-05-08 — End: ?

## 2024-05-13 ENCOUNTER — Encounter: Admitting: Family Medicine

## 2024-05-21 ENCOUNTER — Encounter: Payer: Self-pay | Admitting: Family Medicine

## 2024-05-21 MED ORDER — TERBINAFINE HCL 250 MG PO TABS: 250.0000 mg | ORAL_TABLET | Freq: Every day | ORAL | 0 refills | Status: AC

## 2024-06-05 DIAGNOSIS — C4442 Squamous cell carcinoma of skin of scalp and neck: Secondary | ICD-10-CM | POA: Diagnosis not present

## 2024-06-05 DIAGNOSIS — L570 Actinic keratosis: Secondary | ICD-10-CM | POA: Diagnosis not present

## 2024-06-06 DIAGNOSIS — H25813 Combined forms of age-related cataract, bilateral: Secondary | ICD-10-CM | POA: Diagnosis not present

## 2024-07-28 ENCOUNTER — Other Ambulatory Visit: Payer: Self-pay | Admitting: Family Medicine

## 2024-09-13 ENCOUNTER — Encounter: Admitting: Family Medicine
